# Patient Record
Sex: Male | Born: 1975 | Race: White | Hispanic: No | Marital: Married | Smoking: Former smoker
Health system: Southern US, Community
[De-identification: ages and names within clinical notes are randomized; demographics above are authoritative.]

## PROBLEM LIST (undated history)

## (undated) DIAGNOSIS — E119 Type 2 diabetes mellitus without complications: Secondary | ICD-10-CM

## (undated) DIAGNOSIS — K5790 Diverticulosis of intestine, part unspecified, without perforation or abscess without bleeding: Secondary | ICD-10-CM

## (undated) DIAGNOSIS — C679 Malignant neoplasm of bladder, unspecified: Secondary | ICD-10-CM

## (undated) DIAGNOSIS — Z87442 Personal history of urinary calculi: Secondary | ICD-10-CM

## (undated) DIAGNOSIS — K219 Gastro-esophageal reflux disease without esophagitis: Secondary | ICD-10-CM

## (undated) DIAGNOSIS — J45909 Unspecified asthma, uncomplicated: Secondary | ICD-10-CM

## (undated) DIAGNOSIS — K529 Noninfective gastroenteritis and colitis, unspecified: Secondary | ICD-10-CM

## (undated) DIAGNOSIS — K589 Irritable bowel syndrome without diarrhea: Secondary | ICD-10-CM

## (undated) HISTORY — DX: Irritable bowel syndrome, unspecified: K58.9

## (undated) HISTORY — DX: Diverticulosis of intestine, part unspecified, without perforation or abscess without bleeding: K57.90

---

## 1999-05-22 ENCOUNTER — Emergency Department (HOSPITAL_COMMUNITY): Admission: EM | Admit: 1999-05-22 | Discharge: 1999-05-22 | Payer: Self-pay | Admitting: Emergency Medicine

## 1999-05-22 ENCOUNTER — Encounter: Payer: Self-pay | Admitting: Emergency Medicine

## 2005-05-29 ENCOUNTER — Emergency Department: Payer: Self-pay | Admitting: Emergency Medicine

## 2005-05-29 ENCOUNTER — Other Ambulatory Visit: Payer: Self-pay

## 2005-12-26 ENCOUNTER — Emergency Department: Payer: Self-pay | Admitting: Emergency Medicine

## 2006-04-30 ENCOUNTER — Emergency Department: Payer: Self-pay | Admitting: Emergency Medicine

## 2011-12-18 ENCOUNTER — Encounter (HOSPITAL_COMMUNITY): Payer: Self-pay | Admitting: *Deleted

## 2011-12-18 ENCOUNTER — Emergency Department: Payer: Self-pay | Admitting: Emergency Medicine

## 2011-12-18 ENCOUNTER — Emergency Department (HOSPITAL_COMMUNITY)
Admission: EM | Admit: 2011-12-18 | Discharge: 2011-12-18 | Disposition: A | Discharge status: Home / Self Care | Payer: Self-pay | Attending: Emergency Medicine | Admitting: Emergency Medicine

## 2011-12-18 DIAGNOSIS — I7300 Raynaud's syndrome without gangrene: Secondary | ICD-10-CM | POA: Insufficient documentation

## 2011-12-18 DIAGNOSIS — R209 Unspecified disturbances of skin sensation: Secondary | ICD-10-CM | POA: Insufficient documentation

## 2011-12-18 DIAGNOSIS — F17200 Nicotine dependence, unspecified, uncomplicated: Secondary | ICD-10-CM | POA: Insufficient documentation

## 2011-12-18 MED ORDER — OXYCODONE-ACETAMINOPHEN 5-325 MG PO TABS: 2.0000 | ORAL_TABLET | ORAL | Status: AC | PRN

## 2011-12-18 NOTE — ED Notes (Signed)
To ED for eval of right arm numbness and swelling for the past cple of weeks. No injury noted. No sob. Appears in nad.

## 2011-12-18 NOTE — Discharge Instructions (Signed)
Raynaud's Syndrome Raynaud's Syndrome is a disorder of the blood vessels in your hands and feet. It occurs when small arteries of the arms/hands or legs/feet become sensitive to cold or emotional upset. This causes the arteries to constrict, or narrow, and reduces blood flow to the area. The color in the fingers or toes changes from white to bluish to red and this is not usually painful. There may be numbness and tingling. Sores on the skin (ulcers) can form. Symptoms are usually relieved by warming. HOME CARE INSTRUCTIONS   Avoid exposure to cold. Keep your whole body warm and dry. Dress in layers. Wear mittens or gloves when handling ice or frozen food and when outdoors. Use holders for glasses or cans containing cold drinks. If possible, stay indoors during cold weather.   Limit your use of caffeine. Switch to decaffeinated coffee, tea, and soda pop. Avoid chocolate.   Avoid smoking or being around cigarette smoke. Smoke will make symptoms worse.   Wear loose fitting socks and comfortable, roomy shoes.   Avoid vibrating tools and machinery.   If possible, avoid stressful and emotional situations. Exercise, meditation and yoga may help you cope with stress. Biofeedback may be useful.   Ask your caregiver about medicine (calcium channel blockers) that may control Raynaud's phenomena.  SEEK MEDICAL CARE IF:   Your discomfort becomes worse, despite conservative treatment.   You develop sores on your fingers and toes that do not heal.  Document Released: 07/14/2000 Document Revised: 07/06/2011 Document Reviewed: 07/21/2008 [facility] Patient Information 2012 [place], [place].  STOP smoking.  Use ibuprofen 600 mg every 6 hours for pain. Use percocet for more severe pain.  Follow up with an internal medicine doctor for reevaluation. Return for worse symptoms.

## 2011-12-18 NOTE — ED Provider Notes (Signed)
History  This chart was scribed for Cheri Guppy, MD by Bennett Scrape. This patient was seen in room STRE5/STRE5 and the patient's care was started at 2:22PM.  CSN: [id number]  Arrival date & time 12/18/11  1304   First MD Initiated Contact with Patient 12/18/11 1422      Chief Complaint  Patient presents with  . Numbness    The history is provided by the patient. No language interpreter was used.    Charles Beasley is a 36 y.o. male with a h/o asthma who presents to the Emergency Department complaining of 2 years of gradual onset, gradually worsening, transient right arm numbness from the elbow down with associated pain described as a throbbing sensation. Pt states that he came in today because the symptoms have been at their worst for the past 2 weeks. He reports that swelling of the right hand with mild yellowish discoloration with precede the numbness and pain. Pt states that the pain and numbness is worse with lifting his right arm up. He has not tried any OTC medications to improve his symptoms. He denies any other symptoms such as fever, nausea and weakness. He has no h/o chronic medical conditions. He is a current everyday smoker but denies alcohol use.  No PCP.  Past medical history: asthma-stated by pt at bedside  History reviewed. No pertinent past surgical history.  History reviewed. No pertinent family history.  History  Substance Use Topics  . Smoking status: Current Everyday Smoker    Types: Cigarettes  . Smokeless tobacco: Not on file  . Alcohol Use: No      Review of Systems  Constitutional: Negative for fever and chills.  Respiratory: Negative for cough and shortness of breath.   Gastrointestinal: Negative for nausea and vomiting.  Neurological: Positive for numbness (right arm). Negative for weakness.    Allergies  Review of patient's allergies indicates no known allergies.  Home Medications   Current Outpatient Rx  Name Route Sig Dispense  Refill  . ALBUTEROL SULFATE HFA 108 (90 BASE) MCG/ACT IN AERS Inhalation Inhale 2 puffs into the lungs every 6 (six) hours as needed. For shortness of breath    . OMEPRAZOLE 20 MG PO CPDR Oral Take 20 mg by mouth daily.      Triage Vitals: BP 135/74  Pulse 95  Temp(Src) 97.7 F (36.5 C) (Oral)  Resp 18  SpO2 98%  Physical Exam  Nursing note and vitals reviewed. Constitutional: He is oriented to person, place, and time. He appears well-developed and well-nourished. No distress.  HENT:  Head: Normocephalic and atraumatic.  Eyes: EOM are normal.  Neck: Neck supple. No tracheal deviation present.  Cardiovascular: Normal rate.        Good radial pulses  Pulmonary/Chest: Effort normal. No respiratory distress.  Musculoskeletal: Normal range of motion.       Good capillary refill, good strength in the right arm  Neurological: He is alert and oriented to person, place, and time.  Skin: Skin is warm and dry.  Psychiatric: He has a normal mood and affect. His behavior is normal.    ED Course  Procedures (including critical care time)  DIAGNOSTIC STUDIES: Oxygen Saturation is 98% on room air, normal by my interpretation.    COORDINATION OF CARE: 2:39PM-Discussed discharge plan which includes pain medications with pt and pt agreed. Advised pt to quit smoking due to the fact that it could be the cause of the symptoms. Also advised pt to follow up with my  referral to a rheumatologist.   Labs Reviewed - No data to display No results found.   No diagnosis found.    MDM  Raynaud's dz vs ulnar nerve compression No evidence stroke.  No weakness or discoloration now No vascular deficit asx at this time      I personally performed the services described in this documentation, which was scribed in my presence. The recorded information has been reviewed and considered.    Cheri Guppy, MD 12/18/11 [phone]

## 2013-12-07 ENCOUNTER — Ambulatory Visit: Payer: Self-pay | Admitting: Internal Medicine

## 2014-02-11 DIAGNOSIS — F32A Depression, unspecified: Secondary | ICD-10-CM | POA: Insufficient documentation

## 2014-03-12 DIAGNOSIS — G5601 Carpal tunnel syndrome, right upper limb: Secondary | ICD-10-CM | POA: Insufficient documentation

## 2014-03-12 DIAGNOSIS — H9319 Tinnitus, unspecified ear: Secondary | ICD-10-CM | POA: Insufficient documentation

## 2015-10-15 ENCOUNTER — Encounter: Payer: Self-pay | Admitting: Urgent Care

## 2015-10-15 ENCOUNTER — Emergency Department
Admission: EM | Admit: 2015-10-15 | Discharge: 2015-10-16 | Disposition: A | Discharge status: Home / Self Care | Payer: Medicaid Other | Attending: Emergency Medicine | Admitting: Emergency Medicine

## 2015-10-15 DIAGNOSIS — R000 Tachycardia, unspecified: Secondary | ICD-10-CM | POA: Insufficient documentation

## 2015-10-15 DIAGNOSIS — E119 Type 2 diabetes mellitus without complications: Secondary | ICD-10-CM | POA: Diagnosis not present

## 2015-10-15 DIAGNOSIS — Z79899 Other long term (current) drug therapy: Secondary | ICD-10-CM | POA: Insufficient documentation

## 2015-10-15 DIAGNOSIS — B379 Candidiasis, unspecified: Secondary | ICD-10-CM | POA: Diagnosis not present

## 2015-10-15 DIAGNOSIS — F17210 Nicotine dependence, cigarettes, uncomplicated: Secondary | ICD-10-CM | POA: Diagnosis not present

## 2015-10-15 DIAGNOSIS — B370 Candidal stomatitis: Secondary | ICD-10-CM

## 2015-10-15 DIAGNOSIS — R739 Hyperglycemia, unspecified: Secondary | ICD-10-CM | POA: Diagnosis present

## 2015-10-15 HISTORY — DX: Unspecified asthma, uncomplicated: J45.909

## 2015-10-15 LAB — URINALYSIS COMPLETE WITH MICROSCOPIC (ARMC ONLY)
BILIRUBIN URINE: NEGATIVE
Bacteria, UA: NONE SEEN
Leukocytes, UA: NEGATIVE
NITRITE: NEGATIVE
Protein, ur: NEGATIVE mg/dL
SPECIFIC GRAVITY, URINE: 1.037 — AB (ref 1.005–1.030)
Squamous Epithelial / LPF: NONE SEEN
pH: 6 (ref 5.0–8.0)

## 2015-10-15 LAB — BASIC METABOLIC PANEL
ANION GAP: 12 (ref 5–15)
BUN: 9 mg/dL (ref 6–20)
CALCIUM: 9.1 mg/dL (ref 8.9–10.3)
CO2: 22 mmol/L (ref 22–32)
Chloride: 95 mmol/L — ABNORMAL LOW (ref 101–111)
Creatinine, Ser: 1.05 mg/dL (ref 0.61–1.24)
GFR calc Af Amer: >60 mL/min (ref >60)
GLUCOSE: 512 mg/dL — AB (ref 65–99)
POTASSIUM: 3.6 mmol/L (ref 3.5–5.1)
Sodium: 129 mmol/L — ABNORMAL LOW (ref 135–145)

## 2015-10-15 LAB — CBC
HCT: 46.5 % (ref 40.0–52.0)
Hemoglobin: 16.3 g/dL (ref 13.0–18.0)
MCH: 29.7 pg (ref 26.0–34.0)
MCHC: 35 g/dL (ref 32.0–36.0)
MCV: 85 fL (ref 80.0–100.0)
Platelets: 367 10*3/uL (ref 150–440)
RBC: 5.47 MIL/uL (ref 4.40–5.90)
RDW: 12.9 % (ref 11.5–14.5)
WBC: 10.6 10*3/uL (ref 3.8–10.6)

## 2015-10-15 LAB — GLUCOSE, CAPILLARY
GLUCOSE-CAPILLARY: 485 mg/dL — AB (ref 65–99)
Glucose-Capillary: 330 mg/dL — ABNORMAL HIGH (ref 65–99)
Glucose-Capillary: 543 mg/dL — ABNORMAL HIGH (ref 65–99)

## 2015-10-15 MED ORDER — POTASSIUM CHLORIDE CRYS ER 20 MEQ PO TBCR
40.0000 meq | EXTENDED_RELEASE_TABLET | Freq: Once | ORAL | Status: AC
  Administered 2015-10-15: 40 meq via ORAL
  Filled 2015-10-15: qty 2

## 2015-10-15 MED ORDER — METFORMIN HCL 500 MG PO TABS: 500.0000 mg | ORAL_TABLET | Freq: Two times a day (BID) | ORAL | Status: DC

## 2015-10-15 MED ORDER — METFORMIN HCL 500 MG PO TABS
500.0000 mg | ORAL_TABLET | Freq: Two times a day (BID) | ORAL | Status: DC
Start: 2015-10-15 — End: 2019-09-06

## 2015-10-15 MED ORDER — INSULIN ASPART 100 UNIT/ML ~~LOC~~ SOLN
SUBCUTANEOUS | Status: DC
Start: 2015-10-15 — End: 2015-10-16
  Filled 2015-10-15: qty 5

## 2015-10-15 MED ORDER — SODIUM CHLORIDE 0.9 % IV BOLUS (SEPSIS)
1000.0000 mL | Freq: Once | INTRAVENOUS | Status: AC
  Administered 2015-10-15: 1000 mL via INTRAVENOUS

## 2015-10-15 MED ORDER — INSULIN ASPART 100 UNIT/ML ~~LOC~~ SOLN
5.0000 [IU] | Freq: Once | SUBCUTANEOUS | Status: AC
  Administered 2015-10-15: 5 [IU] via INTRAVENOUS
  Filled 2015-10-15: qty 5

## 2015-10-15 MED ORDER — NYSTATIN 100000 UNIT/ML MT SUSP: 5.0000 mL | Freq: Four times a day (QID) | OROMUCOSAL | Status: DC

## 2015-10-15 NOTE — ED Provider Notes (Signed)
[facility] Emergency Department Provider Note  ____________________________________________  Time seen: Approximately [id number] PM  I have reviewed the triage vital signs and the nursing notes.   HISTORY  Chief Complaint Hyperglycemia    HPI Charles Beasley is a 40 y.o. male with a history of asthma presenting to the emergency department today with 3 months of increased thirst as well as urinary frequency. He has also noticed today that he had patches on his tongue and the back of his throat. His wife bought a home glucometer and took his sugar at home which read greater than 600.He denies any pain, nausea or vomiting at this time. Patient says he has a history of diabetes in his grandmother.   Past Medical History  Diagnosis Date  . Asthma     There are no active problems to display for this patient.   History reviewed. No pertinent past surgical history.  Current Outpatient Rx  Name  Route  Sig  Dispense  Refill  . albuterol (PROVENTIL HFA;VENTOLIN HFA) 108 (90 BASE) MCG/ACT inhaler   Inhalation   Inhale 2 puffs into the lungs every 6 (six) hours as needed. For shortness of breath         . omeprazole (PRILOSEC) 20 MG capsule   Oral   Take 20 mg by mouth daily.           Allergies Review of patient's allergies indicates no known allergies.  No family history on file.  Social History Social History  Substance Use Topics  . Smoking status: Current Every Day Smoker    Types: Cigarettes  . Smokeless tobacco: None  . Alcohol Use: No    Review of Systems Constitutional: No fever/chills Eyes: No visual changes. ENT: No sore throat. Cardiovascular: Denies chest pain. Respiratory: Denies shortness of breath. Gastrointestinal: No abdominal pain.  No nausea, no vomiting.  No diarrhea.  No constipation. Genitourinary: Urinary frequency Musculoskeletal: Negative for back pain. Skin: Negative for rash. Neurological: Negative for headaches,  focal weakness or numbness.  10-point ROS otherwise negative.  ____________________________________________   PHYSICAL EXAM:  VITAL SIGNS: ED Triage Vitals  Enc Vitals Group     BP 10/15/15 2152 149/109 mmHg     Pulse Rate 10/15/15 2152 118     Resp 10/15/15 2152 20     Temp 10/15/15 2152 98 F (36.7 C)     Temp Source 10/15/15 2152 Oral     SpO2 10/15/15 2152 97 %     Weight --      Height --      Head Cir --      Peak Flow --      Pain Score 10/15/15 2151 0     Pain Loc --      Pain Edu? --      Excl. in [place]? --     Constitutional: Alert and oriented. Well appearing and in no acute distress. Eyes: Conjunctivae are normal. PERRL. EOMI. Head: Atraumatic. Nose: No congestion/rhinnorhea. Mouth/Throat:  Mildly dry mucous membranes with small patches of what appear to be thrush on the soft palate and tongue. I'm able to scrape a small amount of these patches with a tongue depressor. Oropharynx non-erythematous. Neck: No stridor.   Cardiovascular: Tachycardic, regular rhythm. Grossly normal heart sounds.  Good peripheral circulation. Respiratory: Normal respiratory effort.  No retractions. Lungs CTAB. Gastrointestinal: Soft and nontender. No distention. No abdominal bruits. No CVA tenderness. Musculoskeletal: No lower extremity tenderness nor edema.  No joint effusions. Neurologic:  Normal speech and language. No gross focal neurologic deficits are appreciated.  Skin:  Skin is warm, dry and intact. No rash noted. Psychiatric: Mood and affect are normal. Speech and behavior are normal.  ____________________________________________   LABS (all labs ordered are listed, but only abnormal results are displayed)  Labs Reviewed  GLUCOSE, CAPILLARY - Abnormal; Notable for the following:    Glucose-Capillary 543 (*)    All other components within normal limits  BASIC METABOLIC PANEL - Abnormal; Notable for the following:    Sodium 129 (*)    Chloride 95 (*)    Glucose, Bld 512  (*)    All other components within normal limits  GLUCOSE, CAPILLARY - Abnormal; Notable for the following:    Glucose-Capillary 485 (*)    All other components within normal limits  URINALYSIS COMPLETEWITH MICROSCOPIC ([facility] ONLY) - Abnormal; Notable for the following:    Color, Urine STRAW (*)    APPearance CLEAR (*)    Glucose, UA >500 (*)    Ketones, ur TRACE (*)    Specific Gravity, Urine 1.037 (*)    Hgb urine dipstick 1+ (*)    All other components within normal limits  CBC  CBG MONITORING, ED  CBG MONITORING, ED   ____________________________________________  EKG   ____________________________________________  RADIOLOGY   ____________________________________________   PROCEDURES  ____________________________________________   INITIAL IMPRESSION / ASSESSMENT AND PLAN / ED COURSE  Pertinent labs & imaging results that were available during my care of the patient were reviewed by me and considered in my medical decision making (see chart for details).  ----------------------------------------- 11:38 PM on 10/15/2015 -----------------------------------------  Patient pending repeat glucose. Signed out to Dr. Dahlia Client. Plan is that of patient's glucose comes down reasonably he should be able to be discharged on metformin. He does not have an elevated anion gap. He does not have a low bicarbonate. He does not appear to be in diabetic ketoacidosis. ____________________________________________   FINAL CLINICAL IMPRESSION(S) / ED DIAGNOSES  New-onset diabetes. Thrush.    Orbie Pyo, MD 10/15/15 [phone]

## 2015-10-15 NOTE — ED Notes (Signed)
Patient presents with c/o polydipsia, polyphagia, and (+) visual changes (blurred) since December. Wife checked CBG today and found it to be >600; no PMH significance for DM.

## 2015-10-16 LAB — GLUCOSE, CAPILLARY: GLUCOSE-CAPILLARY: 226 mg/dL — AB (ref 65–99)

## 2015-10-16 MED ORDER — SODIUM CHLORIDE 0.9 % IV BOLUS (SEPSIS)
1000.0000 mL | Freq: Once | INTRAVENOUS | Status: AC
  Administered 2015-10-16: 1000 mL via INTRAVENOUS

## 2015-10-16 NOTE — ED Provider Notes (Signed)
-----------------------------------------   2:04 AM on 10/16/2015 -----------------------------------------   Blood pressure 131/93, pulse 91, temperature 98 F (36.7 C), temperature source Oral, resp. rate 18, SpO2 95 %.  Assuming care from Dr. Clearnce Hasten.  In short, Charles Beasley is a 40 y.o. male with a chief complaint of Hyperglycemia .  Refer to the original H&P for additional details.  The current plan of care is to follow up the repeat blood sugar after the second liter of fluid.  Repeat blood sugar is 226, patient will be discharged home   Loney Hering, MD 10/16/15 [phone]

## 2015-11-08 ENCOUNTER — Encounter: Payer: Self-pay | Admitting: Dietician

## 2015-11-08 ENCOUNTER — Encounter: Payer: Medicaid Other | Attending: Family Medicine | Admitting: Dietician

## 2015-11-08 VITALS — BP 137/87 | Ht 69.0 in | Wt 167.1 lb

## 2015-11-08 DIAGNOSIS — E119 Type 2 diabetes mellitus without complications: Secondary | ICD-10-CM | POA: Diagnosis present

## 2015-11-08 NOTE — Patient Instructions (Signed)
Check blood sugars 2 x day before breakfast and 2 hrs after supper every day  Exercise:  Continue regular exercise (basketball and home gym 2-3 hr 5x/wk.)   Avoid sugar sweetened drinks (soda, tea, coffee, sports drinks, juices)  Limit intake of sweets and fried foods  Make healthy food choices  Eat 3 meals day,  1-2  snacks a day  Space meals 4-6 hours apart  Make dentist appointment (go every 6 months)  Bring blood sugar records to the next appointment/class  Get a Sharps container  Return for appointment/classes on: pt to call and schedule

## 2015-11-08 NOTE — Progress Notes (Signed)
Diabetes Self-Management Education  Visit Type: First/Initial  Appt. Start Time: 1330 Appt. End Time: 1430  11/08/2015  [name], identified by name and date of birth, is a 40 y.o. male with a diagnosis of Diabetes: Type 2.   ASSESSMENT  Blood pressure 137/87, height 5\' 9"  (1.753 m), weight 167 lb 1.6 oz (75.796 kg). Body mass index is 24.67 kg/(m^2).  Lacks knowledge of diabetes care       Diabetes Self-Management Education - 11/08/15 1501    Visit Information   Visit Type First/Initial   Initial Visit   Diabetes Type Type 2   Health Coping   How would you rate your overall health? Good   Psychosocial Assessment   Patient Belief/Attitude about Diabetes Motivated to manage diabetes   Self-care barriers None   Patient Concerns Medication;Glycemic Control  become more fit   Special Needs None   Preferred Learning Style --  auditory, visual, hands-on   Learning Readiness Ready   What is the last grade level you completed in school? [id number]   Complications   Last HgB A1C per patient/outside source 9.9 %  10-18-15   How often do you check your blood sugar? --  2-3x/day (pt uses [employer] meter)   Fasting Blood glucose range (mg/dL) --  recent 117-169 + 206x1   Postprandial Blood glucose range (mg/dL) 70-129;130-179;180-200   Have you had a dilated eye exam in the past 12 months? Yes   Have you had a dental exam in the past 12 months? No   Are you checking your feet? Yes   How many days per week are you checking your feet? 7   Dietary Intake   Breakfast --  usually eats 10a-12p-occasionally skips   Lunch --  eats lunch at 1-2p=eats fried foods 7 sweets 2-3x/wk.   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)   Media planner (evening) --  eats bedtime snack 10-11p=snack bars or poptart   Beverage(s) --  drinks 8+ waters/day and 2-3 sugar free flavored drinks/day   Exercise   Exercise Type ADL's  plays basketball and works out on home gym equipment 2-3 hr 5x/wk   Patient Education   Previous Diabetes Education No   Disease state  --  definition of type 2 diabetes and treatment options   Nutrition management  Role of diet in the treatment of diabetes and the relationship between the three main macronutrients and blood glucose level;Food label reading, portion sizes and measuring food.;Carbohydrate counting   Physical activity and exercise  Role of exercise on diabetes management, blood pressure control and cardiac health.   Medications --  reviewed use of Metformin (pt started taking 500mg  one tablet BID and increased to 1 tablet in AM and 2 tabs in PM which has caused some GI upset-now MD advised to increase to 1000mg  BID and pt concerned GI symptoms may worsen-pt has IBS)   Monitoring Purpose and frequency of SMBG.;Identified appropriate SMBG and/or A1C goals.;Yearly dilated eye exam;Taught/discussed recording of test results and interpretation of SMBG.   Chronic complications Relationship between chronic complications and blood glucose control;Dental care;Retinopathy and reason for yearly dilated eye exams   Personal strategies to promote health Lifestyle issues that need to be addressed for better diabetes care      Individualized Plan for Diabetes Self-Management Training:   Learning Objective:  Patient will have a greater understanding of diabetes self-management.  Patient education plan is to attend individual and/or group sessions per assessed needs and concerns.   Plan:   Patient Instructions  Check blood sugars 2 x day before breakfast and 2 hrs after supper every day  Exercise:  Continue regular exercise (basketball and home gym 2-3 hr 5x/wk.)   Avoid sugar sweetened drinks (soda, tea, coffee, sports drinks, juices)  Limit intake of sweets and fried foods  Make healthy food choices  Eat 3 meals day,  1-2  snacks a day  Space meals  4-6 hours apart  Make dentist appointment (go every 6 months)  Bring blood sugar records to the next appointment/class  Get a Sharps container  Return for appointment/classes on: pt to call and schedule   Expected Outcomes:   positive  Education material provided: General Meal Planning Guidelines  If problems or questions, patient to contact team via:  [phone]  Future DSME appointment:   pt to call to schedule diabetes classes

## 2015-11-29 ENCOUNTER — Encounter: Payer: Self-pay | Admitting: Dietician

## 2015-11-29 NOTE — Progress Notes (Signed)
Have not heard from pt about scheduling diabetes classes. Called pt but number is disconnected. Mailed card asking pt to call and schedule classes.

## 2015-12-14 ENCOUNTER — Encounter: Payer: Self-pay | Admitting: Dietician

## 2015-12-14 NOTE — Progress Notes (Signed)
Have not heard from pt-pt discharged and letter sent to MD

## 2016-06-21 DIAGNOSIS — E119 Type 2 diabetes mellitus without complications: Secondary | ICD-10-CM | POA: Insufficient documentation

## 2016-11-28 DIAGNOSIS — N200 Calculus of kidney: Secondary | ICD-10-CM | POA: Insufficient documentation

## 2017-01-30 ENCOUNTER — Emergency Department: Payer: Medicaid Other

## 2017-01-30 ENCOUNTER — Emergency Department
Admission: EM | Admit: 2017-01-30 | Discharge: 2017-01-30 | Disposition: A | Discharge status: Home / Self Care | Payer: Medicaid Other | Attending: Emergency Medicine | Admitting: Emergency Medicine

## 2017-01-30 ENCOUNTER — Encounter: Payer: Self-pay | Admitting: Emergency Medicine

## 2017-01-30 DIAGNOSIS — Z87891 Personal history of nicotine dependence: Secondary | ICD-10-CM | POA: Diagnosis not present

## 2017-01-30 DIAGNOSIS — Z79899 Other long term (current) drug therapy: Secondary | ICD-10-CM | POA: Diagnosis not present

## 2017-01-30 DIAGNOSIS — J45909 Unspecified asthma, uncomplicated: Secondary | ICD-10-CM | POA: Diagnosis not present

## 2017-01-30 DIAGNOSIS — N200 Calculus of kidney: Secondary | ICD-10-CM | POA: Diagnosis not present

## 2017-01-30 DIAGNOSIS — R109 Unspecified abdominal pain: Secondary | ICD-10-CM | POA: Diagnosis present

## 2017-01-30 LAB — URINALYSIS, COMPLETE (UACMP) WITH MICROSCOPIC
Bacteria, UA: NONE SEEN
Bilirubin Urine: NEGATIVE
Glucose, UA: >=500 mg/dL — AB
HGB URINE DIPSTICK: NEGATIVE
KETONES UR: NEGATIVE mg/dL
Leukocytes, UA: NEGATIVE
NITRITE: NEGATIVE
PROTEIN: NEGATIVE mg/dL
Specific Gravity, Urine: 1.018 (ref 1.005–1.030)
Squamous Epithelial / LPF: NONE SEEN
pH: 6 (ref 5.0–8.0)

## 2017-01-30 LAB — COMPREHENSIVE METABOLIC PANEL
ALK PHOS: 69 U/L (ref 38–126)
ALT: 14 U/L — ABNORMAL LOW (ref 17–63)
ANION GAP: 4 — AB (ref 5–15)
AST: 25 U/L (ref 15–41)
Albumin: 4.1 g/dL (ref 3.5–5.0)
BUN: 18 mg/dL (ref 6–20)
CALCIUM: 9.3 mg/dL (ref 8.9–10.3)
CO2: 27 mmol/L (ref 22–32)
Chloride: 107 mmol/L (ref 101–111)
Creatinine, Ser: 1.22 mg/dL (ref 0.61–1.24)
GFR calc non Af Amer: >60 mL/min (ref >60)
Glucose, Bld: 239 mg/dL — ABNORMAL HIGH (ref 65–99)
POTASSIUM: 4.2 mmol/L (ref 3.5–5.1)
Sodium: 138 mmol/L (ref 135–145)
Total Bilirubin: 0.6 mg/dL (ref 0.3–1.2)
Total Protein: 7.5 g/dL (ref 6.5–8.1)

## 2017-01-30 LAB — CBC
HEMATOCRIT: 41.3 % (ref 40.0–52.0)
HEMOGLOBIN: 14.2 g/dL (ref 13.0–18.0)
MCH: 30 pg (ref 26.0–34.0)
MCHC: 34.3 g/dL (ref 32.0–36.0)
MCV: 87.4 fL (ref 80.0–100.0)
Platelets: 357 10*3/uL (ref 150–440)
RBC: 4.72 MIL/uL (ref 4.40–5.90)
RDW: 13.6 % (ref 11.5–14.5)
WBC: 13.4 10*3/uL — ABNORMAL HIGH (ref 3.8–10.6)

## 2017-01-30 LAB — LIPASE, BLOOD: LIPASE: 30 U/L (ref 11–51)

## 2017-01-30 MED ORDER — TAMSULOSIN HCL 0.4 MG PO CAPS
0.4000 mg | ORAL_CAPSULE | Freq: Once | ORAL | Status: AC
  Administered 2017-01-30: 0.4 mg via ORAL
  Filled 2017-01-30: qty 1

## 2017-01-30 MED ORDER — ONDANSETRON HCL 4 MG/2ML IJ SOLN
4.0000 mg | Freq: Once | INTRAMUSCULAR | Status: AC
  Administered 2017-01-30: 4 mg via INTRAVENOUS
  Filled 2017-01-30: qty 2

## 2017-01-30 MED ORDER — MORPHINE SULFATE (PF) 4 MG/ML IV SOLN
INTRAVENOUS | Status: AC
  Filled 2017-01-30: qty 1

## 2017-01-30 MED ORDER — MORPHINE SULFATE (PF) 4 MG/ML IV SOLN
4.0000 mg | Freq: Once | INTRAVENOUS | Status: AC
  Administered 2017-01-30: 4 mg via INTRAVENOUS
  Filled 2017-01-30: qty 1

## 2017-01-30 MED ORDER — MORPHINE SULFATE (PF) 4 MG/ML IV SOLN
4.0000 mg | Freq: Once | INTRAVENOUS | Status: AC
  Administered 2017-01-30: 4 mg via INTRAVENOUS

## 2017-01-30 MED ORDER — OXYCODONE-ACETAMINOPHEN 5-325 MG PO TABS: 1.0000 | ORAL_TABLET | ORAL | 0 refills | Status: DC | PRN

## 2017-01-30 MED ORDER — KETOROLAC TROMETHAMINE 10 MG PO TABS: 10.0000 mg | ORAL_TABLET | Freq: Three times a day (TID) | ORAL | 0 refills | Status: DC | PRN

## 2017-01-30 MED ORDER — KETOROLAC TROMETHAMINE 30 MG/ML IJ SOLN
30.0000 mg | Freq: Once | INTRAMUSCULAR | Status: AC
  Administered 2017-01-30: 30 mg via INTRAVENOUS
  Filled 2017-01-30: qty 1

## 2017-01-30 MED ORDER — TAMSULOSIN HCL 0.4 MG PO CAPS: 0.4000 mg | ORAL_CAPSULE | Freq: Every day | ORAL | 0 refills | Status: DC

## 2017-01-30 MED ORDER — ONDANSETRON HCL 4 MG PO TABS: 4.0000 mg | ORAL_TABLET | Freq: Three times a day (TID) | ORAL | 0 refills | Status: DC | PRN

## 2017-01-30 MED ORDER — SODIUM CHLORIDE 0.9 % IV BOLUS (SEPSIS)
1000.0000 mL | Freq: Once | INTRAVENOUS | Status: AC
  Administered 2017-01-30: 1000 mL via INTRAVENOUS

## 2017-01-30 NOTE — ED Provider Notes (Signed)
[facility] Emergency Department Provider Note ____________________________________________   I have reviewed the triage vital signs and the triage nursing note.  HISTORY  Chief Complaint Abdominal Pain   Historian Patient  HPI Charles Beasley is a 41 y.o. male with a hisotry of ibs, prior kidney stone and diverticulitis, presenting for lower abdominal pain and right flank painthat started around 1am.  Flank pain constant, radiation into the right and across bladder area is intermittent.  Currently rates 8/10 pain. He did have some pain down into the right testicle, currently testicle not tender or swollen.  Denies constipation or diarrhea. Denies black or bloody stools. Denies nausea or vomiting. States he might have a low-grade fever.  Nothing makes it worse or better.    Past Medical History:  Diagnosis Date  . Asthma   . Diverticulosis   . IBS (irritable bowel syndrome)     There are no active problems to display for this patient.   History reviewed. No pertinent surgical history.  Prior to Admission medications   Medication Sig Start Date End Date Taking? Authorizing Provider  fexofenadine (ALLEGRA) 180 MG tablet Take 180 mg by mouth daily.   Yes [provider]  meloxicam (MOBIC) 7.5 MG tablet Take 7.5 mg by mouth daily.    Yes [provider]  metFORMIN (GLUCOPHAGE) 500 MG tablet Take 1 tablet (500 mg total) by mouth 2 (two) times daily with a meal. 10/15/15 01/30/17 Yes Schaevitz, Randall An, MD  omeprazole (PRILOSEC) 40 MG capsule Take 1 capsule by mouth daily. 03/23/15 01/30/17 Yes [provider]  albuterol (PROVENTIL HFA;VENTOLIN HFA) 108 (90 BASE) MCG/ACT inhaler Inhale 2 puffs into the lungs every 6 (six) hours as needed. Reported on 11/08/2015    [provider]  ketorolac (TORADOL) 10 MG tablet Take 1 tablet (10 mg total) by mouth every 8 (eight) hours as needed for moderate pain. 01/30/17   Lisa Roca, MD   ondansetron (ZOFRAN) 4 MG tablet Take 1 tablet (4 mg total) by mouth every 8 (eight) hours as needed for nausea or vomiting. 01/30/17   Lisa Roca, MD  oxyCODONE-acetaminophen (ROXICET) 5-325 MG tablet Take 1 tablet by mouth every 4 (four) hours as needed for severe pain. 01/30/17   Lisa Roca, MD  tamsulosin (FLOMAX) 0.4 MG CAPS capsule Take 1 capsule (0.4 mg total) by mouth daily. 01/30/17   Lisa Roca, MD    Allergies  Allergen Reactions  . Montelukast Photosensitivity  . Sertraline Other (See Comments)    Feels out of it, zombie    History reviewed. No pertinent family history.  Social History Social History  Substance Use Topics  . Smoking status: Former Smoker    Types: Cigarettes  . Smokeless tobacco: Not on file  . Alcohol use No    Review of Systems  Constitutional: Negative for fever. Eyes: Negative for visual changes. ENT: Negative for sore throat. Cardiovascular: Negative for chest pain. Respiratory: Negative for shortness of breath. Gastrointestinal: Negative for  vomiting and diarrhea. Genitourinary: Negative for dysuria. Musculoskeletal: Negative for extremity pain. Skin: Negative for rash. Neurological: Negative for headache.  ____________________________________________   PHYSICAL EXAM:  VITAL SIGNS: ED Triage Vitals  Enc Vitals Group     BP 01/30/17 0727 (!) 181/107     Pulse Rate 01/30/17 0727 93     Resp 01/30/17 0727 18     Temp 01/30/17 0727 98.4 F (36.9 C)     Temp Source 01/30/17 0727 Oral  SpO2 01/30/17 0727 98 %     Weight 01/30/17 0727 156 lb (70.8 kg)     Height 01/30/17 0727 5\' 8"  (1.727 m)     Head Circumference --      Peak Flow --      Pain Score 01/30/17 0726 8     Pain Loc --      Pain Edu? --      Excl. in [place]? --      Constitutional: Alert and oriented. Well appearing and in no distress. HEENT   Head: Normocephalic and atraumatic.      Eyes: Conjunctivae are normal. Pupils equal and round.       Ears:          Nose: No congestion/rhinnorhea.   Mouth/Throat: Mucous membranes are moist.   Neck: No stridor. Cardiovascular/Chest: Normal rate, regular rhythm.  No murmurs, rubs, or gallops. Respiratory: Normal respiratory effort without tachypnea nor retractions. Breath sounds are clear and equal bilaterally. No wheezes/rales/rhonchi. Gastrointestinal: Soft. No distention, no guarding, no rebound. Minimal tenderness of the lower abdomen without focal McBurney's point tenderness.  Genitourinary/rectal:Deferred Musculoskeletal: Nontender with normal range of motion in all extremities. No joint effusions.  No lower extremity tenderness.  No edema. Neurologic:  Normal speech and language. No gross or focal neurologic deficits are appreciated. Skin:  Skin is warm, dry and intact. No rash noted. Psychiatric: Mood and affect are normal. Speech and behavior are normal. Patient exhibits appropriate insight and judgment.   ____________________________________________  LABS (pertinent positives/negatives)  Labs Reviewed  COMPREHENSIVE METABOLIC PANEL - Abnormal; Notable for the following:       Result Value   Glucose, Bld 239 (*)    ALT 14 (*)    Anion gap 4 (*)    All other components within normal limits  CBC - Abnormal; Notable for the following:    WBC 13.4 (*)    All other components within normal limits  URINALYSIS, COMPLETE (UACMP) WITH MICROSCOPIC - Abnormal; Notable for the following:    Color, Urine YELLOW (*)    APPearance CLEAR (*)    Glucose, UA >=500 (*)    All other components within normal limits  LIPASE, BLOOD    ____________________________________________    EKG I, Lisa Roca, MD, the attending physician have personally viewed and interpreted all ECGs.  None ____________________________________________  RADIOLOGY All Xrays were viewed by me. Imaging interpreted by Radiologist.  Ultrasound renal:  IMPRESSION: Mild right-sided hydronephrosis. A an echogenic  focus is noted in the upper pole calyx without significant shadowing. This may represent a small nonobstructing stone. There is likely a distal ureteral stone present. CT urogram may be helpful for further evaluation. __________________________________________  PROCEDURES  Procedure(s) performed: None  Critical Care performed: None  ____________________________________________   ED COURSE / ASSESSMENT AND PLAN  Pertinent labs & imaging results that were available during my care of the patient were reviewed by me and considered in my medical decision making (see chart for details).    Mr. Hayashi is here for right-sided flank pain wrapping around which seems clinically most consistent with kidney stone.  We discussed obtaining renal ultrasound rather than CT.  On exam he does not have any focal McBurney's point tenderness. On urinalysis, no significant hematuria, and white blood cell count minimally elevated.  Ultrasound does show right-sided hydro-which I think given his symptoms of right flank discomfort, and no McBurney's point tenderness seemed consistent for moving 4 with diagnosis and treatment of kidney stone.  Patient  received 2 doses of morphine and then dose of ketorolac for symptomatic relief.  I discussed with patient and wife regarding my recommendation to hold off on CT scan at this point time, as it would not change management for kidney stone, I don't have a suspicion at this point for other intra-abdominal emergency condition.    CONSULTATIONS:  None   Patient / Family / Caregiver informed of clinical course, medical decision-making process, and agree with plan.   I discussed return precautions, follow-up instructions, and discharge instructions with patient and/or family.  Discharge Instructions : You were evaluated for right-sided flank pain and were found have evidence of enlarged ureter indicating likely passing a kidney stone on the right side.  Return  to the emergency department immediately for any worsening condition including uncontrolled pain, no worsening abdominal pain, fever, inability to urinate, vomiting and cannot keep medications down, or any other symptoms concerning to you.  If you have not passed stone within 1 week, to make him an appointment with an urologist.  Strain urine and can bring to primary doctor for analysis if you collect it.  ___________________________________________   FINAL CLINICAL IMPRESSION(S) / ED DIAGNOSES   Final diagnoses:  Kidney stone              Note: This dictation was prepared with Dragon dictation. Any transcriptional errors that result from this process are unintentional    Lisa Roca, MD 01/30/17 1017

## 2017-01-30 NOTE — ED Triage Notes (Signed)
Pt c/o lower abdominal and right flank pain. Hx stone and diverticulitis but doesn't feel like either. Has had vomiting. All started at 200 am. Took tramadol from wife. NAD. Ambulatory to room.

## 2017-01-30 NOTE — ED Notes (Signed)
AAOx3.  Skin warm and dry.  NAD 

## 2017-01-30 NOTE — Discharge Instructions (Signed)
You were evaluated for right-sided flank pain and were found have evidence of enlarged ureter indicating likely passing a kidney stone on the right side.  Return to the emergency department immediately for any worsening condition including uncontrolled pain, no worsening abdominal pain, fever, inability to urinate, vomiting and cannot keep medications down, or any other symptoms concerning to you.  If you have not passed stone within 1 week, to make him an appointment with an urologist.  Strain urine and can bring to primary doctor for analysis if you collect it.

## 2017-01-30 NOTE — ED Notes (Signed)
To [place] via stretcher.  AAOx3.  Skin warm and dry.

## 2017-02-02 ENCOUNTER — Ambulatory Visit (INDEPENDENT_AMBULATORY_CARE_PROVIDER_SITE_OTHER): Payer: Medicaid Other | Admitting: Urology

## 2017-02-02 ENCOUNTER — Encounter: Payer: Self-pay | Admitting: Urology

## 2017-02-02 VITALS — BP 133/81 | HR 89 | Ht 68.0 in | Wt 155.9 lb

## 2017-02-02 DIAGNOSIS — Z87442 Personal history of urinary calculi: Secondary | ICD-10-CM | POA: Diagnosis not present

## 2017-02-02 DIAGNOSIS — N1330 Unspecified hydronephrosis: Secondary | ICD-10-CM

## 2017-02-02 LAB — URINALYSIS, COMPLETE
Bilirubin, UA: NEGATIVE
Glucose, UA: NEGATIVE
KETONES UA: NEGATIVE
Nitrite, UA: NEGATIVE
PH UA: 7 (ref 5.0–7.5)
RBC, UA: NEGATIVE
SPEC GRAV UA: 1.02 (ref 1.005–1.030)
Urobilinogen, Ur: 0.2 mg/dL (ref 0.2–1.0)

## 2017-02-02 LAB — MICROSCOPIC EXAMINATION: RBC, UA: NONE SEEN /hpf (ref 0–?)

## 2017-02-02 NOTE — Progress Notes (Signed)
02/02/2017 1:45 PM   Charles Beasley 1976/05/21 [id number]  Referring provider: [facility] [address] [place], [place] [address]  Chief Complaint  Patient presents with  . Nephrolithiasis    HPI: The patient is a 41 year old gentleman with a past medical history of nephrolithiasis who presents today after being seen in the ED for right flank pain and a renal ultrasound showed mild right-sided hydronephrosis. There is a echogenic focus in the right upper pole as well possibly representing a small nonobstructing stone. He did not undergo a CT scan during this visit to the ED per the recommendation of the emergency room physician. He presents today with right flank pain noted still fairly well controlled with Toradol and occasionally Percocet at night. He is straining his urine and on Flomax. He has not passed any stone. He did nephrolithiasis approximate 16 years ago and passed his stone spontaneously. No other stone episodes. He denies nausea, vomiting, fever, and chills.  Urinalysis at this time showed no evidence of hematuria.   PMH: Past Medical History:  Diagnosis Date  . Asthma   . Diverticulosis   . IBS (irritable bowel syndrome)     Surgical History: History reviewed. No pertinent surgical history.  Home Medications:  Allergies as of 02/02/2017      Reactions   Montelukast Photosensitivity   Sertraline Other (See Comments)   Feels out of it, zombie      Medication List       Accurate as of 02/02/17  1:45 PM. Always use your most recent med list.          albuterol 108 (90 Base) MCG/ACT inhaler Commonly known as:  PROVENTIL HFA;VENTOLIN HFA Inhale 2 puffs into the lungs every 6 (six) hours as needed. Reported on 11/08/2015   fexofenadine 180 MG tablet Commonly known as:  ALLEGRA Take 180 mg by mouth daily.   ketorolac 10 MG tablet Commonly known as:  TORADOL Take 1 tablet (10 mg total) by mouth every 8 (eight) hours as needed for moderate pain.   meloxicam 7.5 MG tablet Commonly known as:  MOBIC Take 7.5 mg by mouth daily.   metFORMIN 500 MG tablet Commonly known as:  GLUCOPHAGE Take 1 tablet (500 mg total) by mouth 2 (two) times daily with a meal.   omeprazole 40 MG capsule Commonly known as:  PRILOSEC Take 1 capsule by mouth daily.   ondansetron 4 MG tablet Commonly known as:  ZOFRAN Take 1 tablet (4 mg total) by mouth every 8 (eight) hours as needed for nausea or vomiting.   oxyCODONE-acetaminophen 5-325 MG tablet Commonly known as:  ROXICET Take 1 tablet by mouth every 4 (four) hours as needed for severe pain.   tamsulosin 0.4 MG Caps capsule Commonly known as:  FLOMAX Take 1 capsule (0.4 mg total) by mouth daily.       Allergies:  Allergies  Allergen Reactions  . Montelukast Photosensitivity  . Sertraline Other (See Comments)    Feels out of it, zombie    Family History: Family History  Problem Relation Age of Onset  . Prostate cancer Neg Hx   . Bladder Cancer Neg Hx   . Kidney cancer Neg Hx     Social History:  reports that he has quit smoking. His smoking use included Cigarettes. He has never used smokeless tobacco. He reports that he does not drink alcohol or use drugs.  ROS: UROLOGY Frequent Urination?: No Hard to postpone urination?: No Burning/pain with urination?:  No Get up at night to urinate?: No Leakage of urine?: No Urine stream starts and stops?: No Trouble starting stream?: No Do you have to strain to urinate?: No Blood in urine?: No Urinary tract infection?: No Sexually transmitted disease?: No Injury to kidneys or bladder?: No Painful intercourse?: No Weak stream?: No Erection problems?: No Penile pain?: No  Gastrointestinal Nausea?: Yes Vomiting?: Yes Indigestion/heartburn?: Yes Diarrhea?: Yes Constipation?: No  Constitutional Fever: Yes Night sweats?: Yes Weight loss?: Yes Fatigue?: Yes  Skin Skin rash/lesions?: No Itching?: No  Eyes Blurred vision?:  Yes Double vision?: No  Ears/Nose/Throat Sore throat?: Yes Sinus problems?: Yes  Hematologic/Lymphatic Swollen glands?: No Easy bruising?: No  Cardiovascular Leg swelling?: No Chest pain?: No  Respiratory Cough?: Yes Shortness of breath?: Yes  Endocrine Excessive thirst?: Yes  Musculoskeletal Back pain?: No Joint pain?: Yes  Neurological Headaches?: Yes Dizziness?: No  Psychologic Depression?: Yes Anxiety?: Yes  Physical Exam: BP 133/81 (BP Location: Left Arm, Patient Position: Sitting, Cuff Size: Normal)   Pulse 89   Ht 5\' 8"  (1.727 m)   Wt 155 lb 14.4 oz (70.7 kg)   BMI 23.70 kg/m   Constitutional:  Alert and oriented, No acute distress. HEENT: [place] AT, moist mucus membranes.  Trachea midline, no masses. Cardiovascular: No clubbing, cyanosis, or edema. Respiratory: Normal respiratory effort, no increased work of breathing. GI: Abdomen is soft, nontender, nondistended, no abdominal masses GU: No CVA tenderness.  Skin: No rashes, bruises or suspicious lesions. Lymph: No cervical or inguinal adenopathy. Neurologic: Grossly intact, no focal deficits, moving all 4 extremities. Psychiatric: Normal mood and affect.  Laboratory Data: Lab Results  Component Value Date   WBC 13.4 (H) 01/30/2017   HGB 14.2 01/30/2017   HCT 41.3 01/30/2017   MCV 87.4 01/30/2017   PLT 357 01/30/2017    Lab Results  Component Value Date   CREATININE 1.22 01/30/2017    No results found for: PSA  No results found for: TESTOSTERONE  No results found for: HGBA1C  Urinalysis    Component Value Date/Time   COLORURINE YELLOW (A) 01/30/2017 0801   APPEARANCEUR CLEAR (A) 01/30/2017 0801   LABSPEC 1.018 01/30/2017 0801   PHURINE 6.0 01/30/2017 0801   GLUCOSEU >=500 (A) 01/30/2017 0801   HGBUR NEGATIVE 01/30/2017 0801   BILIRUBINUR NEGATIVE 01/30/2017 0801   KETONESUR NEGATIVE 01/30/2017 0801   PROTEINUR NEGATIVE 01/30/2017 0801   NITRITE NEGATIVE 01/30/2017 0801    LEUKOCYTESUR NEGATIVE 01/30/2017 0801    Pertinent Imaging: Renal ultrasound reviewed as above.  Assessment & Plan:   1. History of nephrolithiasis 2. Right hydronephrosis I discussed with the patient that his renal ultrasound that showed right hydronephrosis which likely is from nephrolithiasis, however he cannot make a diagnosis of right ureteral stone based on this imaging. I also discussed that it is unclear if the artifact seen in his right kidney that was nonobstructing is a calculus. I discussed treatment approaches which time which include repeat renal ultrasound with KUB in 2 weeks to look for resolution of hydronephrosis versus undergoing CT stone study now to better characterize true stone burden. He has elected to undergo a CT stone protocol. We will arrange for this. He'll follow-up after for results and to discuss possible interventions if needed.  Return for after CT.  Nickie Retort, MD  [facility] [address] [place], [place] [address] [phone]

## 2017-02-22 ENCOUNTER — Ambulatory Visit: Payer: Medicaid Other | Admitting: Urology

## 2017-02-28 ENCOUNTER — Ambulatory Visit
Admission: RE | Admit: 2017-02-28 | Discharge: 2017-02-28 | Disposition: A | Discharge status: Home / Self Care | Payer: BLUE CROSS/BLUE SHIELD | Source: Ambulatory Visit | Attending: Urology | Admitting: Urology

## 2017-02-28 DIAGNOSIS — N1330 Unspecified hydronephrosis: Secondary | ICD-10-CM | POA: Diagnosis present

## 2017-02-28 DIAGNOSIS — N132 Hydronephrosis with renal and ureteral calculous obstruction: Secondary | ICD-10-CM | POA: Insufficient documentation

## 2017-02-28 DIAGNOSIS — Z87442 Personal history of urinary calculi: Secondary | ICD-10-CM

## 2017-03-09 DIAGNOSIS — I700 Atherosclerosis of aorta: Secondary | ICD-10-CM | POA: Insufficient documentation

## 2017-03-12 ENCOUNTER — Telehealth: Payer: Self-pay

## 2017-03-12 NOTE — Telephone Encounter (Signed)
Charles Retort, MD  [name], LPN        Please let patient know he has 4 mm stone in right ureter on CT scan. If he does not see stone pass in strainer, he will need a KUB and follow up in 2 weeks to ensure it passes. He has a few small stones in his kidneys that insignificant in size and nothing to worry about.   Thanks    Spoke with pt wife in reference to CT results and needing to use a strainer. Wife voiced understanding.

## 2017-03-13 ENCOUNTER — Ambulatory Visit: Payer: Medicaid Other

## 2017-03-13 ENCOUNTER — Other Ambulatory Visit: Payer: Self-pay | Admitting: Family Medicine

## 2017-03-13 DIAGNOSIS — R932 Abnormal findings on diagnostic imaging of liver and biliary tract: Secondary | ICD-10-CM

## 2017-03-20 ENCOUNTER — Ambulatory Visit
Admission: RE | Admit: 2017-03-20 | Discharge: 2017-03-20 | Disposition: A | Discharge status: Home / Self Care | Payer: BLUE CROSS/BLUE SHIELD | Source: Ambulatory Visit | Attending: Family Medicine | Admitting: Family Medicine

## 2017-03-20 DIAGNOSIS — R932 Abnormal findings on diagnostic imaging of liver and biliary tract: Secondary | ICD-10-CM | POA: Insufficient documentation

## 2017-10-12 ENCOUNTER — Other Ambulatory Visit
Admission: RE | Admit: 2017-10-12 | Discharge: 2017-10-12 | Disposition: A | Discharge status: Home / Self Care | Payer: BLUE CROSS/BLUE SHIELD | Source: Ambulatory Visit | Attending: Student | Admitting: Student

## 2017-10-12 DIAGNOSIS — R197 Diarrhea, unspecified: Secondary | ICD-10-CM | POA: Diagnosis not present

## 2017-10-12 LAB — GASTROINTESTINAL PANEL BY PCR, STOOL (REPLACES STOOL CULTURE)

## 2017-10-12 LAB — C DIFFICILE QUICK SCREEN W PCR REFLEX
C Diff antigen: NEGATIVE
C Diff interpretation: NOT DETECTED
C Diff toxin: NEGATIVE

## 2017-10-15 LAB — CALPROTECTIN, FECAL: Calprotectin, Fecal: 45 ug/g (ref 0–120)

## 2017-10-17 LAB — PANCREATIC ELASTASE, FECAL

## 2018-08-29 DIAGNOSIS — L301 Dyshidrosis [pompholyx]: Secondary | ICD-10-CM | POA: Insufficient documentation

## 2019-01-28 ENCOUNTER — Other Ambulatory Visit: Payer: Self-pay

## 2019-01-28 ENCOUNTER — Emergency Department: Payer: BLUE CROSS/BLUE SHIELD

## 2019-01-28 ENCOUNTER — Emergency Department
Admission: EM | Admit: 2019-01-28 | Discharge: 2019-01-28 | Disposition: A | Discharge status: Home / Self Care | Payer: BLUE CROSS/BLUE SHIELD | Attending: Emergency Medicine | Admitting: Emergency Medicine

## 2019-01-28 ENCOUNTER — Encounter: Payer: Self-pay | Admitting: Emergency Medicine

## 2019-01-28 DIAGNOSIS — N23 Unspecified renal colic: Secondary | ICD-10-CM | POA: Insufficient documentation

## 2019-01-28 DIAGNOSIS — J45909 Unspecified asthma, uncomplicated: Secondary | ICD-10-CM | POA: Insufficient documentation

## 2019-01-28 DIAGNOSIS — Z87891 Personal history of nicotine dependence: Secondary | ICD-10-CM | POA: Insufficient documentation

## 2019-01-28 DIAGNOSIS — Z79899 Other long term (current) drug therapy: Secondary | ICD-10-CM | POA: Insufficient documentation

## 2019-01-28 HISTORY — DX: Personal history of urinary calculi: Z87.442

## 2019-01-28 LAB — CBC WITH DIFFERENTIAL/PLATELET
Abs Immature Granulocytes: 0.12 10*3/uL — ABNORMAL HIGH (ref 0.00–0.07)
Basophils Absolute: 0.1 10*3/uL (ref 0.0–0.1)
Basophils Relative: 1 %
Eosinophils Absolute: 0.4 10*3/uL (ref 0.0–0.5)
Eosinophils Relative: 2 %
HCT: 41.6 % (ref 39.0–52.0)
Hemoglobin: 14.5 g/dL (ref 13.0–17.0)
Immature Granulocytes: 1 %
Lymphocytes Relative: 16 %
Lymphs Abs: 3.1 10*3/uL (ref 0.7–4.0)
MCH: 30.4 pg (ref 26.0–34.0)
MCHC: 34.9 g/dL (ref 30.0–36.0)
MCV: 87.2 fL (ref 80.0–100.0)
Monocytes Absolute: 0.9 10*3/uL (ref 0.1–1.0)
Monocytes Relative: 5 %
Neutro Abs: 14.5 10*3/uL — ABNORMAL HIGH (ref 1.7–7.7)
Neutrophils Relative %: 75 %
Platelets: 403 10*3/uL — ABNORMAL HIGH (ref 150–400)
RBC: 4.77 MIL/uL (ref 4.22–5.81)
RDW: 12.9 % (ref 11.5–15.5)
WBC: 19.1 10*3/uL — ABNORMAL HIGH (ref 4.0–10.5)
nRBC: 0 % (ref 0.0–0.2)

## 2019-01-28 LAB — COMPREHENSIVE METABOLIC PANEL
ALT: 15 U/L (ref 0–44)
AST: 28 U/L (ref 15–41)
Albumin: 4.6 g/dL (ref 3.5–5.0)
Alkaline Phosphatase: 74 U/L (ref 38–126)
Anion gap: 14 (ref 5–15)
BUN: 20 mg/dL (ref 6–20)
CO2: 20 mmol/L — ABNORMAL LOW (ref 22–32)
Calcium: 9.7 mg/dL (ref 8.9–10.3)
Chloride: 104 mmol/L (ref 98–111)
Creatinine, Ser: 1.24 mg/dL (ref 0.61–1.24)
GFR calc Af Amer: >60 mL/min (ref >60)
GFR calc non Af Amer: >60 mL/min (ref >60)
Glucose, Bld: 240 mg/dL — ABNORMAL HIGH (ref 70–99)
Potassium: 3.5 mmol/L (ref 3.5–5.1)
Sodium: 138 mmol/L (ref 135–145)
Total Bilirubin: 0.7 mg/dL (ref 0.3–1.2)
Total Protein: 8 g/dL (ref 6.5–8.1)

## 2019-01-28 LAB — URINALYSIS, ROUTINE W REFLEX MICROSCOPIC
Bacteria, UA: NONE SEEN
Bilirubin Urine: NEGATIVE
Glucose, UA: >=500 mg/dL — AB
Ketones, ur: 20 mg/dL — AB
Leukocytes,Ua: NEGATIVE
Nitrite: NEGATIVE
Protein, ur: 30 mg/dL — AB
RBC / HPF: >50 RBC/hpf — ABNORMAL HIGH (ref 0–5)
Specific Gravity, Urine: 1.025 (ref 1.005–1.030)
Squamous Epithelial / LPF: NONE SEEN (ref 0–5)
pH: 7 (ref 5.0–8.0)

## 2019-01-28 LAB — LIPASE, BLOOD: Lipase: 27 U/L (ref 11–51)

## 2019-01-28 MED ORDER — ONDANSETRON HCL 4 MG/2ML IJ SOLN
4.0000 mg | INTRAMUSCULAR | Status: AC
  Administered 2019-01-28: 4 mg via INTRAVENOUS
  Filled 2019-01-28: qty 2

## 2019-01-28 MED ORDER — KETOROLAC TROMETHAMINE 30 MG/ML IJ SOLN
15.0000 mg | Freq: Once | INTRAMUSCULAR | Status: AC
  Administered 2019-01-28: 15 mg via INTRAVENOUS
  Filled 2019-01-28: qty 1

## 2019-01-28 MED ORDER — DOCUSATE SODIUM 100 MG PO CAPS: ORAL_CAPSULE | ORAL | 0 refills | Status: DC

## 2019-01-28 MED ORDER — OXYCODONE-ACETAMINOPHEN 5-325 MG PO TABS
2.0000 | ORAL_TABLET | Freq: Once | ORAL | Status: AC
  Administered 2019-01-28: 2 via ORAL
  Filled 2019-01-28: qty 2

## 2019-01-28 MED ORDER — SODIUM CHLORIDE 0.9 % IV BOLUS
1000.0000 mL | Freq: Once | INTRAVENOUS | Status: AC
  Administered 2019-01-28: 1000 mL via INTRAVENOUS

## 2019-01-28 MED ORDER — HYDROMORPHONE HCL 1 MG/ML IJ SOLN
1.0000 mg | INTRAMUSCULAR | Status: AC
  Administered 2019-01-28: 1 mg via INTRAVENOUS
  Filled 2019-01-28: qty 1

## 2019-01-28 MED ORDER — TAMSULOSIN HCL 0.4 MG PO CAPS: ORAL_CAPSULE | ORAL | 0 refills | Status: DC

## 2019-01-28 MED ORDER — ONDANSETRON 4 MG PO TBDP: ORAL_TABLET | ORAL | 0 refills | Status: DC

## 2019-01-28 MED ORDER — OXYCODONE-ACETAMINOPHEN 5-325 MG PO TABS: 2.0000 | ORAL_TABLET | Freq: Four times a day (QID) | ORAL | 0 refills | Status: DC | PRN

## 2019-01-28 MED ORDER — SODIUM CHLORIDE 0.9 % IV SOLN
106.0000 mg | Freq: Once | INTRAVENOUS | Status: AC
  Administered 2019-01-28: 106 mg via INTRAVENOUS
  Filled 2019-01-28: qty 5.3

## 2019-01-28 NOTE — Discharge Instructions (Signed)
You have been seen in the Emergency Department (ED) today for pain that we believe based on your workup, is caused by kidney stones.  As we have discussed, please drink plenty of fluids.  Please make a follow up appointment with the physician(s) listed elsewhere in this documentation. ° °You may take pain medication as needed but ONLY as prescribed.  Please also take your prescribed Flomax daily.  We also recommend that you take over-the-counter ibuprofen regularly according to label instructions over the next 5 days.  Take it with meals to minimize stomach discomfort. ° °Please see your doctor as soon as possible as stones may take 1-3 weeks to pass and you may require additional care or medications. ° °Do not drink alcohol, drive or participate in any other potentially dangerous activities while taking opiate pain medication as it may make you sleepy. Do not take this medication with any other sedating medications, either prescription or over-the-counter. If you were prescribed Percocet or Vicodin, do not take these with acetaminophen (Tylenol) as it is already contained within these medications. °  °Take Percocet as needed for severe pain.  This medication is an opiate (or narcotic) pain medication and can be habit forming.  Use it as little as possible to achieve adequate pain control.  Do not use or use it with extreme caution if you have a history of opiate abuse or dependence.  If you are on a pain contract with your primary care doctor or a pain specialist, be sure to let them know you were prescribed this medication today from the [facility].  This medication is intended for your use only - do not give any to anyone else and keep it in a secure place where nobody else, especially children, have access to it.  It will also cause or worsen constipation, so you may want to consider taking an over-the-counter stool softener while you are taking this medication. ° °Return to the  Emergency Department (ED) or call your doctor if you have any worsening pain, fever, painful urination, are unable to urinate, or develop other symptoms that concern you. ° °

## 2019-01-28 NOTE — ED Provider Notes (Signed)
[facility] Emergency Department Provider Note  ____________________________________________   First MD Initiated Contact with Patient 01/28/19 [phone]     (approximate)  I have reviewed the triage vital signs and the nursing notes.   HISTORY  Chief Complaint Flank Pain    HPI Charles Beasley is a 43 y.o. male with medical history as listed below who presents by EMS for evaluation of severe sharp stabbing pain in his left flank rating into the left side of his lower abdomen accompanied with nausea and vomiting.  He says he has been having more minor episodes for the last week that he has been "handling it".  He is trying to wait because he recently lost his insurance due to being furloughed from the COVID-19 pandemic.  However tonight the pain was severe for the last hour and nothing particular made it better or worse.  He cannot find a position of comfort.  He has been shaking and shuddering due to the pain and the nausea.  He denies any contact with coronavirus patients and denies sore throat, cough, congestion, shortness of breath.  He has no abdominal pain except for the pain radiating around from his left flank.  He has not seen any blood in his urine and has no dysuria.  He has a history of kidney stones in the past and this feels similar but is on the opposite side for what he usually experiences.         Past Medical History:  Diagnosis Date   Asthma    Diverticulosis    History of kidney stones    IBS (irritable bowel syndrome)     There are no active problems to display for this patient.   History reviewed. No pertinent surgical history.  Prior to Admission medications   Medication Sig Start Date End Date Taking? Authorizing Provider  albuterol (PROVENTIL HFA;VENTOLIN HFA) 108 (90 BASE) MCG/ACT inhaler Inhale 2 puffs into the lungs every 6 (six) hours as needed. Reported on 11/08/2015    [provider]  docusate sodium (COLACE) 100 MG  capsule Take 1 tablet once or twice daily as needed for constipation while taking narcotic pain medicine 01/28/19   Hinda Kehr, MD  fexofenadine (ALLEGRA) 180 MG tablet Take 180 mg by mouth daily.    [provider]  ketorolac (TORADOL) 10 MG tablet Take 1 tablet (10 mg total) by mouth every 8 (eight) hours as needed for moderate pain. 01/30/17   Lisa Roca, MD  meloxicam (MOBIC) 7.5 MG tablet Take 7.5 mg by mouth daily.     [provider]  metFORMIN (GLUCOPHAGE) 500 MG tablet Take 1 tablet (500 mg total) by mouth 2 (two) times daily with a meal. 10/15/15 02/02/17  Schaevitz, Randall An, MD  omeprazole (PRILOSEC) 40 MG capsule Take 1 capsule by mouth daily. 03/23/15 02/02/17  [provider]  ondansetron (ZOFRAN ODT) 4 MG disintegrating tablet Allow 1-2 tablets to dissolve in your mouth every 8 hours as needed for nausea/vomiting 01/28/19   Hinda Kehr, MD  ondansetron (ZOFRAN) 4 MG tablet Take 1 tablet (4 mg total) by mouth every 8 (eight) hours as needed for nausea or vomiting. 01/30/17   Lisa Roca, MD  oxyCODONE-acetaminophen (PERCOCET) 5-325 MG tablet Take 2 tablets by mouth every 6 (six) hours as needed for severe pain. 01/28/19   Hinda Kehr, MD  tamsulosin (FLOMAX) 0.4 MG CAPS capsule Take 1 tablet by mouth daily until you pass the kidney stone or no longer  have symptoms 01/28/19   Hinda Kehr, MD    Allergies Montelukast and Sertraline  Family History  Problem Relation Age of Onset   Prostate cancer Neg Hx    Bladder Cancer Neg Hx    Kidney cancer Neg Hx     Social History Social History   Tobacco Use   Smoking status: Former Smoker    Types: Cigarettes   Smokeless tobacco: Never Used  Substance Use Topics   Alcohol use: No    Alcohol/week: 0.0 standard drinks   Drug use: No    Review of Systems Constitutional: No fever/chills Eyes: No visual changes. ENT: No sore throat. Cardiovascular: Denies chest pain. Respiratory: Denies  shortness of breath. Gastrointestinal: Pain in the left lower quadrant of his abdomen radiating from the left flank.  Multiple episodes of nausea and vomiting. Genitourinary: Negative for dysuria. Musculoskeletal: Severe left flank pain.   Integumentary: Negative for rash. Neurological: Negative for headaches, focal weakness or numbness.   ____________________________________________   PHYSICAL EXAM:  VITAL SIGNS: ED Triage Vitals  Enc Vitals Group     BP 01/28/19 0352 (!) 155/96     Pulse Rate 01/28/19 0352 95     Resp 01/28/19 0352 18     Temp 01/28/19 0352 98.4 F (36.9 C)     Temp Source 01/28/19 0352 Oral     SpO2 01/28/19 0352 100 %     Weight 01/28/19 0353 70.8 kg (156 lb)     Height 01/28/19 0353 1.753 m (5\' 9" )     Head Circumference --      Peak Flow --      Pain Score 01/28/19 0353 10     Pain Loc --      Pain Edu? --      Excl. in [place]? --     Constitutional: Alert and oriented.  Appears to be in severe pain. Eyes: Conjunctivae are normal.  Head: Atraumatic. Nose: No congestion/rhinnorhea. Mouth/Throat: Mucous membranes are moist. Neck: No stridor.  No meningeal signs.   Cardiovascular: Normal rate, regular rhythm. Good peripheral circulation. Grossly normal heart sounds. Respiratory: Normal respiratory effort.  No retractions. No audible wheezing. Gastrointestinal: Soft and nontender. No distention.  Musculoskeletal: Left CVA tenderness to percussion.  No lower extremity tenderness nor edema. No gross deformities of extremities. Neurologic:  Normal speech and language. No gross focal neurologic deficits are appreciated.  Skin:  Skin is warm, mildly diaphoretic and intact. No rash noted. Psychiatric: Mood and affect are normal. Speech and behavior are normal.  ____________________________________________   LABS (all labs ordered are listed, but only abnormal results are displayed)  Labs Reviewed  CBC WITH DIFFERENTIAL/PLATELET - Abnormal; Notable for the  following components:      Result Value   WBC 19.1 (*)    Platelets 403 (*)    Neutro Abs 14.5 (*)    Abs Immature Granulocytes 0.12 (*)    All other components within normal limits  COMPREHENSIVE METABOLIC PANEL - Abnormal; Notable for the following components:   CO2 20 (*)    Glucose, Bld 240 (*)    All other components within normal limits  LIPASE, BLOOD  URINALYSIS, ROUTINE W REFLEX MICROSCOPIC   ____________________________________________  EKG  ED ECG REPORT I, Hinda Kehr, the attending physician, personally viewed and interpreted this ECG.  Date: 01/28/2019 EKG Time: 3:50 AM Rate: 103 Rhythm: Sinus tachycardia QRS Axis: normal Intervals: normal with LVH ST/T Wave abnormalities: normal Narrative Interpretation: no evidence of acute ischemia  ____________________________________________  RADIOLOGY   ED MD interpretation:  3-mm left distal ureteral obstructive stone  Official radiology report(s): Ct Renal Stone Study  Result Date: 01/28/2019 CLINICAL DATA:  Recurrent flank pain on the left EXAM: CT ABDOMEN AND PELVIS WITHOUT CONTRAST TECHNIQUE: Multidetector CT imaging of the abdomen and pelvis was performed following the standard protocol without IV contrast. COMPARISON:  02/28/2017 FINDINGS: Lower chest:  No contributory findings. Hepatobiliary: Steatosis in the right liver also suggested on prior.No evidence of biliary obstruction or stone. Pancreas: Unremarkable. Spleen: Unremarkable. Adrenals/Urinary Tract: Negative adrenals. Mild left hydronephrosis and hydroureter with left renal low-density expansion due to a 3 mm stone in the distal ureter. Punctate left renal calculus on coronal reformats. Unremarkable bladder. Stomach/Bowel: No obstruction. No appendicitis. Sigmoid diverticulosis. Vascular/Lymphatic: No acute vascular abnormality. Atherosclerotic calcification that is notable for age. No mass or adenopathy. Reproductive:Negative. Other: No ascites or  pneumoperitoneum. Musculoskeletal: No acute abnormalities. IMPRESSION: 1. Obstructing 3 mm distal left ureteral calculus. 2. Sigmoid diverticulosis. Electronically Signed   By: Monte Fantasia M.D.   On: 01/28/2019 04:49    ____________________________________________   PROCEDURES   Procedure(s) performed (including Critical Care):  Procedures   ____________________________________________   INITIAL IMPRESSION / MDM / [place] / ED COURSE  As part of my medical decision making, I reviewed the following data within the [place] notes reviewed and incorporated, Labs reviewed , Old chart reviewed, Notes from prior ED visits and [place] Controlled Substance Database      *DARRIS STAIGER was evaluated in Emergency Department on 01/28/2019 for the symptoms described in the history of present illness. He was evaluated in the context of the global COVID-19 pandemic, which necessitated consideration that the patient might be at risk for infection with the SARS-CoV-2 virus that causes COVID-19. Institutional protocols and algorithms that pertain to the evaluation of patients at risk for COVID-19 are in a state of rapid change based on information released by regulatory bodies including the CDC and federal and state organizations. These policies and algorithms were followed during the patient's care in the ED.  Some ED evaluations and interventions may be delayed as a result of limited staffing during the pandemic.*  Differential diagnosis includes, but is not limited to, ureteral colic, UTI/pyelonephritis, musculoskeletal pain/strain, less likely renal infarction or renal abscess.  The patient's symptoms are consistent with ureteral colic and he is currently in severe distress but is afebrile and only borderline tachycardic.  I have ordered 1 L normal saline, Dilaudid 1 mg IV, and Zofran 4 mg IV.  Lab work is pending and I have ordered a CT renal stone study of the  abdomen and pelvis.  EKG is nonischemic.  Clinical Course as of Jan 28 640  Tue Jan 28, 2019  0406 WBC(!): 19.1 [CF]  0443 Essentially normal comprehensive metabolic panel except for some hyperglycemia.  Comprehensive metabolic panel(!) [CF]  3267 No leukocytosis and thrombocytosis likely represents volume depletion in the setting of persistent nausea and vomiting and I am already addressing it with a normal saline IV bolus of 1 L.  Platelets(!): 403 [CF]  0444 Lipase: 27 [CF]  0513 3 mm obstructive distal left ureteral stone.  Patient is feeling a little bit better after Dilaudid but is still in moderate to severe pain.  It is more than 48 hours since he would even be eligible for lithotripsy, so I am ordering Toradol 15 mg IV as well as lidocaine for renal colic (1.5 mg/kg).  I am  also ordering a second liter normal saline given I think he is volume depleted and is still mildly tachycardic at about 105.  He has not yet provided a urine specimen but he believes he will be able to do so shortly.  CT Renal Laren Everts [CF]  8421 After the lidocaine and Toradol the patient is feeling much better and so the pain is minimal.  He is no longer tachycardic.  He is comfortable going home.  However he has still not provided a urine specimen.  This paperwork is ready to go assuming he does not need any antibiotics.  He can be discharged as soon as his urinalysis comes back as long as it is consistent only with hematuria.   [CF]    Clinical Course User Index [CF] Hinda Kehr, MD     ____________________________________________  FINAL CLINICAL IMPRESSION(S) / ED DIAGNOSES  Final diagnoses:  Ureteral colic     MEDICATIONS GIVEN DURING THIS VISIT:  Medications  HYDROmorphone (DILAUDID) injection 1 mg (1 mg Intravenous Given 01/28/19 0404)  ondansetron (ZOFRAN) injection 4 mg (4 mg Intravenous Given 01/28/19 0404)  sodium chloride 0.9 % bolus 1,000 mL (0 mLs Intravenous Stopped 01/28/19 0557)    sodium chloride 0.9 % bolus 1,000 mL (0 mLs Intravenous Stopped 01/28/19 0601)  lidocaine (XYLOCAINE) 106 mg in sodium chloride 0.9 % 100 mL IVPB (0 mg Intravenous Stopped 01/28/19 0601)  ketorolac (TORADOL) 30 MG/ML injection 15 mg (15 mg Intravenous Given 01/28/19 0530)     ED Discharge Orders         Ordered    oxyCODONE-acetaminophen (PERCOCET) 5-325 MG tablet  Every 6 hours PRN     01/28/19 0547    ondansetron (ZOFRAN ODT) 4 MG disintegrating tablet     01/28/19 0547    tamsulosin (FLOMAX) 0.4 MG CAPS capsule     01/28/19 0547    docusate sodium (COLACE) 100 MG capsule     01/28/19 0547           Note:  This document was prepared using Dragon voice recognition software and may include unintentional dictation errors.   Hinda Kehr, MD 01/28/19 [phone]

## 2019-01-28 NOTE — ED Triage Notes (Signed)
Pt has a hx of kidney stones and for the past week he has been having increased left sided pain. Tonight the pain was excruciating.

## 2019-06-13 ENCOUNTER — Other Ambulatory Visit: Payer: Self-pay

## 2019-06-13 DIAGNOSIS — Z20822 Contact with and (suspected) exposure to covid-19: Secondary | ICD-10-CM

## 2019-06-15 LAB — NOVEL CORONAVIRUS, NAA: SARS-CoV-2, NAA: NOT DETECTED

## 2019-07-04 ENCOUNTER — Other Ambulatory Visit: Payer: Self-pay

## 2019-07-04 ENCOUNTER — Emergency Department: Payer: BC Managed Care – PPO

## 2019-07-04 ENCOUNTER — Emergency Department
Admission: EM | Admit: 2019-07-04 | Discharge: 2019-07-05 | Disposition: A | Discharge status: Home / Self Care | Payer: BC Managed Care – PPO | Attending: Emergency Medicine | Admitting: Emergency Medicine

## 2019-07-04 DIAGNOSIS — R1012 Left upper quadrant pain: Secondary | ICD-10-CM | POA: Insufficient documentation

## 2019-07-04 DIAGNOSIS — Z87891 Personal history of nicotine dependence: Secondary | ICD-10-CM | POA: Diagnosis not present

## 2019-07-04 DIAGNOSIS — R531 Weakness: Secondary | ICD-10-CM | POA: Diagnosis present

## 2019-07-04 DIAGNOSIS — M79672 Pain in left foot: Secondary | ICD-10-CM | POA: Insufficient documentation

## 2019-07-04 DIAGNOSIS — Z79899 Other long term (current) drug therapy: Secondary | ICD-10-CM | POA: Insufficient documentation

## 2019-07-04 DIAGNOSIS — Z7984 Long term (current) use of oral hypoglycemic drugs: Secondary | ICD-10-CM | POA: Insufficient documentation

## 2019-07-04 DIAGNOSIS — R1011 Right upper quadrant pain: Secondary | ICD-10-CM | POA: Insufficient documentation

## 2019-07-04 DIAGNOSIS — J45909 Unspecified asthma, uncomplicated: Secondary | ICD-10-CM | POA: Diagnosis not present

## 2019-07-04 DIAGNOSIS — R109 Unspecified abdominal pain: Secondary | ICD-10-CM

## 2019-07-04 LAB — URINALYSIS, COMPLETE (UACMP) WITH MICROSCOPIC
Bacteria, UA: NONE SEEN
Bilirubin Urine: NEGATIVE
Glucose, UA: >=500 mg/dL — AB
Hgb urine dipstick: NEGATIVE
Ketones, ur: 5 mg/dL — AB
Leukocytes,Ua: NEGATIVE
Nitrite: NEGATIVE
Protein, ur: NEGATIVE mg/dL
Specific Gravity, Urine: 1.03 (ref 1.005–1.030)
Squamous Epithelial / HPF: NONE SEEN (ref 0–5)
pH: 5 (ref 5.0–8.0)

## 2019-07-04 LAB — COMPREHENSIVE METABOLIC PANEL
ALT: 13 U/L (ref 0–44)
AST: 16 U/L (ref 15–41)
Albumin: 4.6 g/dL (ref 3.5–5.0)
Alkaline Phosphatase: 70 U/L (ref 38–126)
Anion gap: 11 (ref 5–15)
BUN: 11 mg/dL (ref 6–20)
CO2: 23 mmol/L (ref 22–32)
Calcium: 9.3 mg/dL (ref 8.9–10.3)
Chloride: 105 mmol/L (ref 98–111)
Creatinine, Ser: 0.72 mg/dL (ref 0.61–1.24)
GFR calc Af Amer: >60 mL/min (ref >60)
GFR calc non Af Amer: >60 mL/min (ref >60)
Glucose, Bld: 244 mg/dL — ABNORMAL HIGH (ref 70–99)
Potassium: 3.9 mmol/L (ref 3.5–5.1)
Sodium: 139 mmol/L (ref 135–145)
Total Bilirubin: 0.3 mg/dL (ref 0.3–1.2)
Total Protein: 7.9 g/dL (ref 6.5–8.1)

## 2019-07-04 LAB — CBC WITH DIFFERENTIAL/PLATELET
Abs Immature Granulocytes: 0.08 10*3/uL — ABNORMAL HIGH (ref 0.00–0.07)
Basophils Absolute: 0.1 10*3/uL (ref 0.0–0.1)
Basophils Relative: 1 %
Eosinophils Absolute: 0.5 10*3/uL (ref 0.0–0.5)
Eosinophils Relative: 4 %
HCT: 42.2 % (ref 39.0–52.0)
Hemoglobin: 14.2 g/dL (ref 13.0–17.0)
Immature Granulocytes: 1 %
Lymphocytes Relative: 27 %
Lymphs Abs: 3.6 10*3/uL (ref 0.7–4.0)
MCH: 30.3 pg (ref 26.0–34.0)
MCHC: 33.6 g/dL (ref 30.0–36.0)
MCV: 90 fL (ref 80.0–100.0)
Monocytes Absolute: 0.6 10*3/uL (ref 0.1–1.0)
Monocytes Relative: 5 %
Neutro Abs: 8.2 10*3/uL — ABNORMAL HIGH (ref 1.7–7.7)
Neutrophils Relative %: 62 %
Platelets: 432 10*3/uL — ABNORMAL HIGH (ref 150–400)
RBC: 4.69 MIL/uL (ref 4.22–5.81)
RDW: 12.9 % (ref 11.5–15.5)
WBC: 13 10*3/uL — ABNORMAL HIGH (ref 4.0–10.5)
nRBC: 0 % (ref 0.0–0.2)

## 2019-07-04 LAB — LIPASE, BLOOD: Lipase: 30 U/L (ref 11–51)

## 2019-07-04 NOTE — ED Provider Notes (Signed)
[facility] Emergency Department Provider Note  ____________________________________________  Time seen: Approximately 8:29 PM  The following is a medical screening exam note. It is intended that the patient await an ER room assignment for detailed exam, diagnosis, and disposition.  I have reviewed the triage vital signs.    HISTORY  Chief Complaint Abdominal Pain and Abscess   HPI Charles Beasley is a 43 y.o. male that presents to the emergency department for evaluation of weakness, bilateral side pain, urinary frequency worse for 3 days.  Patient's blood sugar has also been high.  It has been in the 300s and 400s.  Patient states that he also has had a painful left foot for several years.  Patient had a procedure done with urology recently.  No shortness of breath, chest pain.  No known fevers.  Past Medical History:  Diagnosis Date  . Asthma   . Diverticulosis   . History of kidney stones   . IBS (irritable bowel syndrome)     There are no active problems to display for this patient.   No past surgical history on file.  Prior to Admission medications   Medication Sig Start Date End Date Taking? Authorizing Provider  albuterol (PROVENTIL HFA;VENTOLIN HFA) 108 (90 BASE) MCG/ACT inhaler Inhale 2 puffs into the lungs every 6 (six) hours as needed. Reported on 11/08/2015    [provider]  docusate sodium (COLACE) 100 MG capsule Take 1 tablet once or twice daily as needed for constipation while taking narcotic pain medicine 01/28/19   Hinda Kehr, MD  fexofenadine (ALLEGRA) 180 MG tablet Take 180 mg by mouth daily.    [provider]  ketorolac (TORADOL) 10 MG tablet Take 1 tablet (10 mg total) by mouth every 8 (eight) hours as needed for moderate pain. 01/30/17   Lisa Roca, MD  meloxicam (MOBIC) 7.5 MG tablet Take 7.5 mg by mouth daily.     [provider]  metFORMIN (GLUCOPHAGE) 500 MG tablet Take 1 tablet (500 mg total) by  mouth 2 (two) times daily with a meal. 10/15/15 02/02/17  Schaevitz, Randall An, MD  omeprazole (PRILOSEC) 40 MG capsule Take 1 capsule by mouth daily. 03/23/15 02/02/17  [provider]  ondansetron (ZOFRAN ODT) 4 MG disintegrating tablet Allow 1-2 tablets to dissolve in your mouth every 8 hours as needed for nausea/vomiting 01/28/19   Hinda Kehr, MD  ondansetron (ZOFRAN) 4 MG tablet Take 1 tablet (4 mg total) by mouth every 8 (eight) hours as needed for nausea or vomiting. 01/30/17   Lisa Roca, MD  oxyCODONE-acetaminophen (PERCOCET) 5-325 MG tablet Take 2 tablets by mouth every 6 (six) hours as needed for severe pain. 01/28/19   Hinda Kehr, MD  tamsulosin (FLOMAX) 0.4 MG CAPS capsule Take 1 tablet by mouth daily until you pass the kidney stone or no longer have symptoms 01/28/19   Hinda Kehr, MD    Allergies Montelukast and Sertraline  Family History  Problem Relation Age of Onset  . Prostate cancer Neg Hx   . Bladder Cancer Neg Hx   . Kidney cancer Neg Hx     Social History Social History   Tobacco Use  . Smoking status: Former Smoker    Types: Cigarettes  . Smokeless tobacco: Never Used  Substance Use Topics  . Alcohol use: No    Alcohol/week: 0.0 standard drinks  . Drug use: No    Review of Systems Constitutional: Negative for fever. Respiratory: No shortness of breath. Gastrointestinal: Positive  for abdominal pain.  No nausea, no vomiting.   Skin: Negative for rash/lesion/wound. Neurological: Negative for headaches, focal weakness or numbness.  ____________________________________________   PHYSICAL EXAM:  VITAL SIGNS:  Vitals:   07/04/19 1952  BP: (!) 126/92  Pulse: 93  Resp: 18  Temp: 98.7 F (37.1 C)  SpO2: 97%    Constitutional: Alert and oriented. No acute distress. Head: Atraumatic. Nose: No congestion/rhinnorhea. Mouth/Throat: Mucous membranes are moist. Neck: No stridor.  Cardiovascular: Good peripheral circulation. Respiratory:  Normal respiratory effort. Musculoskeletal: No restriction  Neurologic:  Normal speech and language. No gross focal neurologic deficits are appreciated. Speech is normal. No gait instability. Skin:  Skin is warm, dry and intact. No rash noted.  No skin changes or wound to left foot. Psychiatric: Mood and affect are normal. Speech and behavior are normal.  ____________________________________________   LABS (all labs ordered are listed, but only abnormal results are displayed)  Labs Reviewed  COMPREHENSIVE METABOLIC PANEL - Abnormal; Notable for the following components:      Result Value   Glucose, Bld 244 (*)    All other components within normal limits  CBC WITH DIFFERENTIAL/PLATELET - Abnormal; Notable for the following components:   WBC 13.0 (*)    Platelets 432 (*)    Neutro Abs 8.2 (*)    Abs Immature Granulocytes 0.08 (*)    All other components within normal limits  URINALYSIS, COMPLETE (UACMP) WITH MICROSCOPIC - Abnormal; Notable for the following components:   Color, Urine YELLOW (*)    APPearance CLEAR (*)    Glucose, UA >=500 (*)    Ketones, ur 5 (*)    All other components within normal limits  LIPASE, BLOOD   ____________________________________________  EKG    INITIAL CLINICAL IMPRESSION(S)   Patient presents emergency department for evaluation of multiple concerns.  Vital signs are reassuring.  Basic lab work, foot x-ray, chest x-ray, EKG were ordered.   Laban Emperor, PA-C 07/04/19 2201    Delman Kitten, MD 07/04/19 [phone]

## 2019-07-04 NOTE — ED Notes (Signed)
Assessment: pt states he had bilateral abdominal pain for days, "something on my bladder", and a spot on his left foot. Pt with area of swelling noted to left lateral foot that is soft and has some bruising noted. Pt states he is having urinary frequency.

## 2019-07-04 NOTE — ED Provider Notes (Signed)
[facility] Emergency Department Provider Note  ____________________________________________   First MD Initiated Contact with Patient 07/04/19 2345     (approximate)  I have reviewed the triage vital signs and the nursing notes.   HISTORY  Chief Complaint Abdominal Pain and Abscess    HPI Charles Beasley is a 43 y.o. male with a complicated urological history that includes recurrent kidney/ureteral stones previously requiring multiple stents and lithotripsy as well as ongoing concern by [facility] urology for the possibility of bladder cancer for which he has a cystoscopy scheduled at [facility] urology in January.  He presents tonight for persistent pain on both sides of his back and abdomen that he says went away after his last kidney stone and stents were removed but came back a few weeks ago.  It is gotten to be severe of the last couple of days and he says he could not take it anymore tonight and came in.  He is also wanting evaluation of a bump on the top of his left foot that he says is also very painful and making it difficult for him to walk.  This  area on his foot has apparently been present "for years".  He has had no trauma of which he is aware.  He reports that the pain in his sides and abdomen are similar to prior kidney stone pain and has been on and off for the last few months.  He last had his stents placed about 2 months ago and he had a follow-up appointment with urology a month ago.  Since that time he has had worsening and more consistent pain.  He says that it is accompanied with occasional nausea and vomiting.  He denies dysuria and gross hematuria.  He denies fever/chills, sore throat, chest pain, cough, shortness of breath.  His pain is similar to prior episodes of ureteral colic.  He denies contact with COVID-19 patients.  His follow-up appointment for his cystoscopy is not for another 1+ month.        Past Medical History:  Diagnosis Date    Asthma    Diverticulosis    History of kidney stones    IBS (irritable bowel syndrome)     There are no active problems to display for this patient.   No past surgical history on file.  Prior to Admission medications   Medication Sig Start Date End Date Taking? Authorizing Provider  albuterol (PROVENTIL HFA;VENTOLIN HFA) 108 (90 BASE) MCG/ACT inhaler Inhale 2 puffs into the lungs every 6 (six) hours as needed. Reported on 11/08/2015    [provider]  docusate sodium (COLACE) 100 MG capsule Take 1 tablet once or twice daily as needed for constipation while taking narcotic pain medicine 01/28/19   Hinda Kehr, MD  fexofenadine (ALLEGRA) 180 MG tablet Take 180 mg by mouth daily.    [provider]  ketorolac (TORADOL) 10 MG tablet Take 1 tablet (10 mg total) by mouth every 8 (eight) hours as needed for moderate pain. 01/30/17   Lisa Roca, MD  meloxicam (MOBIC) 7.5 MG tablet Take 7.5 mg by mouth daily.     [provider]  metFORMIN (GLUCOPHAGE) 500 MG tablet Take 1 tablet (500 mg total) by mouth 2 (two) times daily with a meal. 10/15/15 02/02/17  Schaevitz, Randall An, MD  omeprazole (PRILOSEC) 40 MG capsule Take 1 capsule by mouth daily. 03/23/15 02/02/17  [provider]  ondansetron (ZOFRAN ODT) 4 MG disintegrating tablet Allow 1-2  tablets to dissolve in your mouth every 8 hours as needed for nausea/vomiting 01/28/19   Hinda Kehr, MD  ondansetron (ZOFRAN) 4 MG tablet Take 1 tablet (4 mg total) by mouth every 8 (eight) hours as needed for nausea or vomiting. 01/30/17   Lisa Roca, MD  oxyCODONE-acetaminophen (PERCOCET) 5-325 MG tablet Take 2 tablets by mouth every 6 (six) hours as needed for severe pain. 01/28/19   Hinda Kehr, MD  tamsulosin (FLOMAX) 0.4 MG CAPS capsule Take 1 tablet by mouth daily until you pass the kidney stone or no longer have symptoms 01/28/19   Hinda Kehr, MD    Allergies Montelukast and Sertraline  Family History    Problem Relation Age of Onset   Prostate cancer Neg Hx    Bladder Cancer Neg Hx    Kidney cancer Neg Hx     Social History Social History   Tobacco Use   Smoking status: Former Smoker    Types: Cigarettes   Smokeless tobacco: Never Used  Substance Use Topics   Alcohol use: No    Alcohol/week: 0.0 standard drinks   Drug use: No    Review of Systems Constitutional: No fever/chills Eyes: No visual changes. ENT: No sore throat. Cardiovascular: Denies chest pain. Respiratory: Denies shortness of breath. Gastrointestinal: Pain in bilateral sides for weeks but worse over the last few days consistent with prior kidney stones.  Accompanied with occasional nausea and vomiting. Genitourinary: Negative for dysuria. Musculoskeletal: Pain in the left foot for "years", worse recently.  Pain in bilateral sides 4 weeks but worse over the last few days consistent with prior kidney stones. Integumentary: Negative for rash. Neurological: Negative for headaches, focal weakness or numbness.   ____________________________________________   PHYSICAL EXAM:  VITAL SIGNS: ED Triage Vitals  Enc Vitals Group     BP 07/04/19 1952 (!) 126/92     Pulse Rate 07/04/19 1952 93     Resp 07/04/19 1952 18     Temp 07/04/19 1952 98.7 F (37.1 C)     Temp Source 07/04/19 1952 Oral     SpO2 07/04/19 1952 97 %     Weight 07/04/19 1948 70.8 kg (156 lb)     Height 07/04/19 1948 1.753 m (5\' 9" )     Head Circumference --      Peak Flow --      Pain Score 07/04/19 1948 9     Pain Loc --      Pain Edu? --      Excl. in [place]? --     Constitutional: Alert and oriented.  Appears to be in pain, grimacing and moaning slightly. Eyes: Conjunctivae are normal.  Head: Atraumatic. Nose: No congestion/rhinnorhea. Mouth/Throat: Patient is wearing a mask. Neck: No stridor.  No meningeal signs.   Cardiovascular: Normal rate, regular rhythm. Good peripheral circulation. Grossly normal heart  sounds. Respiratory: Normal respiratory effort.  No retractions. Gastrointestinal: Soft and nondistended with diffuse tenderness to palpation of the abdomen without any specific focal tenderness. Musculoskeletal: Bilateral CVA tenderness to mild percussion.  Left foot is normal in appearance, no bruising, no abscess, no erythema, no cellulitis, no foot wounds or ulcers.  There is some focal tenderness to the top lateral part of his foot where there seems to be a little bit of soft tissue swelling, almost like a bruise or subacute hematoma, but no evidence of any acute or emergent abnormality. Neurologic:  Normal speech and language. No gross focal neurologic deficits are appreciated.  Skin:  Skin is  warm, dry and intact. Psychiatric: Mood and affect are normal. Speech and behavior are normal.  ____________________________________________   LABS (all labs ordered are listed, but only abnormal results are displayed)  Labs Reviewed  COMPREHENSIVE METABOLIC PANEL - Abnormal; Notable for the following components:      Result Value   Glucose, Bld 244 (*)    All other components within normal limits  CBC WITH DIFFERENTIAL/PLATELET - Abnormal; Notable for the following components:   WBC 13.0 (*)    Platelets 432 (*)    Neutro Abs 8.2 (*)    Abs Immature Granulocytes 0.08 (*)    All other components within normal limits  URINALYSIS, COMPLETE (UACMP) WITH MICROSCOPIC - Abnormal; Notable for the following components:   Color, Urine YELLOW (*)    APPearance CLEAR (*)    Glucose, UA >=500 (*)    Ketones, ur 5 (*)    All other components within normal limits  LIPASE, BLOOD   ____________________________________________  EKG  ED ECG REPORT I, Hinda Kehr, the attending physician, personally viewed and interpreted this ECG.  Date: 07/04/2019 EKG Time: 19: 50 Rate: 90 Rhythm: normal sinus rhythm QRS Axis: normal Intervals: normal with left ventricular hypertrophy ST/T Wave abnormalities:  normal Narrative Interpretation: no evidence of acute ischemia  ____________________________________________  RADIOLOGY I, Hinda Kehr, personally viewed and evaluated these images (plain radiographs) as part of my medical decision making, as well as reviewing the written report by the radiologist.  ED MD interpretation: No indication of acute abnormalities on x-rays of the chest nor the foot.  No acute abnormalities identified on CT.  Official radiology report(s): Dg Chest 2 View  Result Date: 07/04/2019 CLINICAL DATA:  Bilateral side pain. EXAM: CHEST - 2 VIEW COMPARISON:  05/29/2005 FINDINGS: The heart size and mediastinal contours are within normal limits. Both lungs are clear. The visualized skeletal structures are unremarkable. IMPRESSION: No acute cardiopulmonary disease. Electronically Signed   By: Zetta Bills M.D.   On: 07/04/2019 20:27   Dg Foot Complete Left  Result Date: 07/04/2019 CLINICAL DATA:  Foot pain, area on the side of left foot, hurts while walking started this morning but been there for years by report. EXAM: LEFT FOOT - COMPLETE 3+ VIEW COMPARISON:  None. FINDINGS: There is no evidence of fracture or dislocation. There is no evidence of arthropathy or other focal bone abnormality. Soft tissues are unremarkable. IMPRESSION: Negative evaluation of the left foot. Electronically Signed   By: Zetta Bills M.D.   On: 07/04/2019 20:26   Ct Renal Stone Study  Result Date: 07/05/2019 CLINICAL DATA:  Weakness and bilateral flank pain. Urinary frequency. EXAM: CT ABDOMEN AND PELVIS WITHOUT CONTRAST TECHNIQUE: Multidetector CT imaging of the abdomen and pelvis was performed following the standard protocol without IV contrast. COMPARISON:  January 28, 2019 FINDINGS: Lower chest: No acute abnormality. Hepatobiliary: No focal liver abnormality is seen. No gallstones, gallbladder wall thickening, or biliary dilatation. Pancreas: Unremarkable. No pancreatic ductal dilatation or  surrounding inflammatory changes. Spleen: Normal in size without focal abnormality. Adrenals/Urinary Tract: Tiny stones are seen in both kidneys with at least 3 tiny stones on the right and 1 on the left. No perinephric stranding or hydronephrosis identified. No ureterectasis or ureteral stones. The bladder is normal. Stomach/Bowel: Stomach is within normal limits. Appendix appears normal. No evidence of bowel wall thickening, distention, or inflammatory changes. Vascular/Lymphatic: Calcified atherosclerosis is seen in the nonaneurysmal aorta. No adenopathy. Reproductive: Prostate is unremarkable. Other: No abdominal wall hernia or abnormality. No  abdominopelvic ascites. Musculoskeletal: No acute or significant osseous findings. IMPRESSION: 1. Nonobstructive stones are seen in both kidneys. 2. Calcified atherosclerosis in the nonaneurysmal aorta. 3. No other acute abnormalities. Electronically Signed   By: Dorise Bullion III M.D   On: 07/05/2019 01:15    ____________________________________________   PROCEDURES   Procedure(s) performed (including Critical Care):  Procedures   ____________________________________________   INITIAL IMPRESSION / MDM / [place] / ED COURSE  As part of my medical decision making, I reviewed the following data within the [place] notes reviewed and incorporated, Labs reviewed , EKG interpreted , Old chart reviewed, Radiograph reviewed , Notes from prior ED visits and  Controlled Substance Database   Differential diagnosis includes, but is not limited to, ureteral colic from recurrent kidney stones, bladder spasms, bladder neoplasm, other acute infectious process in the abdomen such as diverticulitis or appendicitis, acute on chronic pain.  The foot x-rays are reassuring and showed no bony abnormality and the physical exam is reassuring as well and I explained to him he has some musculoskeletal pain of the foot but there is  nothing emergent on which to intervene.  Regarding his main issue tonight, what seems to be recurrent ureteral colic, given his complicated history, I will obtain a CT renal stone protocol to see if he has any new stones.  I have seen this patient in the past and he responded to a combination of Dilaudid, Toradol, and eventually lidocaine IV.  I will start tonight with Dilaudid and Toradol and a liter of fluids.  His work-up is reassuring with a normal comprehensive metabolic panel, no evidence of infection or blood on urinalysis, normal lipase, and a normal CBC except for mild leukocytosis of 13.  We had my usual discussion about management of expectations, and I explained that I will try to make his pain better but resolving the pain completely is unlikely given his chronic conditions and the fact that he has been having pain for weeks even though the symptoms have recently gotten worse.  He says he understands the plan.  [place] controlled substance database does show multiple prior prescriptions but none in the last 2 months since he was seen by urology and had stents placed which is reassuring.      Clinical Course as of Jul 05 447  Sat Jul 05, 2019  0135 Patient is reporting that his pain is no better after Dilaudid although he is lying in bed comfortably without tachycardia and appears more comfortable than prior.  Given his history I have ordered lidocaine 1.5 mg/kg as per protocol for renal colic treatment although there is no evidence of an obstructive ureteral stone on his CT scan.  Theoretically he could have had another stone that he passed because he does have bilateral intrarenal stones but these are not likely the source of his pain.  I anticipate discharge with outpatient follow-up with his urologist at [facility].   [CF]  [id number] No evidence of any acute intra-abdominal process.  Patient is calm and cooperative and comfortable at this time.  I updated him with the good news that there is no  evidence of an emergent medical condition he is comfortable with the plan for discharge and outpatient follow-up with his urologist.  I gave my usual and customary return precautions.   [CF]    Clinical Course User Index [CF] Hinda Kehr, MD     ____________________________________________  FINAL CLINICAL IMPRESSION(S) / ED DIAGNOSES  Final  diagnoses:  Bilateral flank pain  Left foot pain     MEDICATIONS GIVEN DURING THIS VISIT:  Medications  ketorolac (TORADOL) 30 MG/ML injection 15 mg (15 mg Intravenous Given 07/05/19 0028)  HYDROmorphone (DILAUDID) injection 1 mg (1 mg Intravenous Given 07/05/19 0028)  sodium chloride 0.9 % bolus 1,000 mL (0 mLs Intravenous Stopped 07/05/19 0134)  lidocaine (XYLOCAINE) 106 mg in sodium chloride 0.9 % 100 mL IVPB (0 mg/kg  70.8 kg Intravenous Stopped 07/05/19 0232)     ED Discharge Orders    None      *Please note:  KAW OSCARSON was evaluated in Emergency Department on 07/05/2019 for the symptoms described in the history of present illness. He was evaluated in the context of the global COVID-19 pandemic, which necessitated consideration that the patient might be at risk for infection with the SARS-CoV-2 virus that causes COVID-19. Institutional protocols and algorithms that pertain to the evaluation of patients at risk for COVID-19 are in a state of rapid change based on information released by regulatory bodies including the CDC and federal and state organizations. These policies and algorithms were followed during the patient's care in the ED.  Some ED evaluations and interventions may be delayed as a result of limited staffing during the pandemic.*  Note:  This document was prepared using Dragon voice recognition software and may include unintentional dictation errors.   Hinda Kehr, MD 07/05/19 [phone]

## 2019-07-04 NOTE — ED Triage Notes (Signed)
Patient reports he has an area on side of left foot that has been there for years (never been seen by MD) and it now hurts especially when he walks, started this am.  Patient also reports bilateral side pain off/on since September.  Reports seen by [facility] for a "spot" on his bladder and prostate.  Also reports had some stones "blasted".  Patient also reports feeling weak.

## 2019-07-05 ENCOUNTER — Emergency Department: Payer: BC Managed Care – PPO

## 2019-07-05 MED ORDER — SODIUM CHLORIDE 0.9 % IV SOLN
1.5000 mg/kg | Freq: Once | INTRAVENOUS | Status: AC
  Administered 2019-07-05: 02:00:00 106 mg via INTRAVENOUS
  Filled 2019-07-05: qty 5.3

## 2019-07-05 MED ORDER — HYDROMORPHONE HCL 1 MG/ML IJ SOLN
1.0000 mg | INTRAMUSCULAR | Status: AC
  Administered 2019-07-05: 1 mg via INTRAVENOUS
  Filled 2019-07-05: qty 1

## 2019-07-05 MED ORDER — SODIUM CHLORIDE 0.9 % IV BOLUS
1000.0000 mL | Freq: Once | INTRAVENOUS | Status: AC
  Administered 2019-07-05: 1000 mL via INTRAVENOUS

## 2019-07-05 MED ORDER — KETOROLAC TROMETHAMINE 30 MG/ML IJ SOLN
15.0000 mg | Freq: Once | INTRAMUSCULAR | Status: AC
  Administered 2019-07-05: 15 mg via INTRAVENOUS
  Filled 2019-07-05: qty 1

## 2019-07-05 NOTE — ED Notes (Signed)
Waiting on lidocaine from pharmacy.

## 2019-07-05 NOTE — ED Notes (Signed)
Pt continues to rate pain 10/10, but appears in no acute distress, texting on phone.

## 2019-07-05 NOTE — Discharge Instructions (Addendum)
Your workup in the Emergency Department today was reassuring.  We did not find any specific abnormalities.  We recommend you drink plenty of fluids, take your regular medications and/or any new ones prescribed today, and follow up with the doctor(s) listed in these documents as recommended.  Return to the Emergency Department if you develop new or worsening symptoms that concern you.  

## 2019-09-06 ENCOUNTER — Emergency Department: Payer: BC Managed Care – PPO

## 2019-09-06 ENCOUNTER — Encounter: Payer: Self-pay | Admitting: Intensive Care

## 2019-09-06 ENCOUNTER — Inpatient Hospital Stay
Admission: EM | Admit: 2019-09-06 | Discharge: 2019-09-10 | DRG: 392 | Disposition: A | Discharge status: Home / Self Care | Payer: BC Managed Care – PPO | Attending: Internal Medicine | Admitting: Internal Medicine

## 2019-09-06 ENCOUNTER — Other Ambulatory Visit: Payer: Self-pay

## 2019-09-06 DIAGNOSIS — Z833 Family history of diabetes mellitus: Secondary | ICD-10-CM

## 2019-09-06 DIAGNOSIS — A09 Infectious gastroenteritis and colitis, unspecified: Secondary | ICD-10-CM | POA: Diagnosis not present

## 2019-09-06 DIAGNOSIS — J45909 Unspecified asthma, uncomplicated: Secondary | ICD-10-CM | POA: Diagnosis present

## 2019-09-06 DIAGNOSIS — E119 Type 2 diabetes mellitus without complications: Secondary | ICD-10-CM | POA: Diagnosis present

## 2019-09-06 DIAGNOSIS — R251 Tremor, unspecified: Secondary | ICD-10-CM

## 2019-09-06 DIAGNOSIS — Z7984 Long term (current) use of oral hypoglycemic drugs: Secondary | ICD-10-CM

## 2019-09-06 DIAGNOSIS — Z79899 Other long term (current) drug therapy: Secondary | ICD-10-CM

## 2019-09-06 DIAGNOSIS — R1030 Lower abdominal pain, unspecified: Secondary | ICD-10-CM

## 2019-09-06 DIAGNOSIS — C679 Malignant neoplasm of bladder, unspecified: Secondary | ICD-10-CM | POA: Diagnosis present

## 2019-09-06 DIAGNOSIS — K1230 Oral mucositis (ulcerative), unspecified: Secondary | ICD-10-CM | POA: Diagnosis present

## 2019-09-06 DIAGNOSIS — K529 Noninfective gastroenteritis and colitis, unspecified: Secondary | ICD-10-CM | POA: Diagnosis present

## 2019-09-06 DIAGNOSIS — Z20822 Contact with and (suspected) exposure to covid-19: Secondary | ICD-10-CM | POA: Diagnosis present

## 2019-09-06 DIAGNOSIS — E785 Hyperlipidemia, unspecified: Secondary | ICD-10-CM | POA: Diagnosis present

## 2019-09-06 DIAGNOSIS — F329 Major depressive disorder, single episode, unspecified: Secondary | ICD-10-CM | POA: Diagnosis present

## 2019-09-06 DIAGNOSIS — R143 Flatulence: Secondary | ICD-10-CM | POA: Diagnosis not present

## 2019-09-06 DIAGNOSIS — Z87891 Personal history of nicotine dependence: Secondary | ICD-10-CM

## 2019-09-06 DIAGNOSIS — Z791 Long term (current) use of non-steroidal anti-inflammatories (NSAID): Secondary | ICD-10-CM

## 2019-09-06 HISTORY — DX: Malignant neoplasm of bladder, unspecified: C67.9

## 2019-09-06 HISTORY — DX: Type 2 diabetes mellitus without complications: E11.9

## 2019-09-06 LAB — CBC
HCT: 41.5 % (ref 39.0–52.0)
Hemoglobin: 14 g/dL (ref 13.0–17.0)
MCH: 30.8 pg (ref 26.0–34.0)
MCHC: 33.7 g/dL (ref 30.0–36.0)
MCV: 91.4 fL (ref 80.0–100.0)
Platelets: 428 10*3/uL — ABNORMAL HIGH (ref 150–400)
RBC: 4.54 MIL/uL (ref 4.22–5.81)
RDW: 13.2 % (ref 11.5–15.5)
WBC: 19.9 10*3/uL — ABNORMAL HIGH (ref 4.0–10.5)
nRBC: 0 % (ref 0.0–0.2)

## 2019-09-06 LAB — COMPREHENSIVE METABOLIC PANEL
ALT: 18 U/L (ref 0–44)
AST: 20 U/L (ref 15–41)
Albumin: 4.3 g/dL (ref 3.5–5.0)
Alkaline Phosphatase: 56 U/L (ref 38–126)
Anion gap: 13 (ref 5–15)
BUN: 9 mg/dL (ref 6–20)
CO2: 21 mmol/L — ABNORMAL LOW (ref 22–32)
Calcium: 9.3 mg/dL (ref 8.9–10.3)
Chloride: 105 mmol/L (ref 98–111)
Creatinine, Ser: 0.85 mg/dL (ref 0.61–1.24)
GFR calc Af Amer: >60 mL/min (ref >60)
GFR calc non Af Amer: >60 mL/min (ref >60)
Glucose, Bld: 95 mg/dL (ref 70–99)
Potassium: 3.5 mmol/L (ref 3.5–5.1)
Sodium: 139 mmol/L (ref 135–145)
Total Bilirubin: 0.7 mg/dL (ref 0.3–1.2)
Total Protein: 7.5 g/dL (ref 6.5–8.1)

## 2019-09-06 LAB — URINALYSIS, COMPLETE (UACMP) WITH MICROSCOPIC
Bacteria, UA: NONE SEEN
Bilirubin Urine: NEGATIVE
Glucose, UA: NEGATIVE mg/dL
Hgb urine dipstick: NEGATIVE
Ketones, ur: NEGATIVE mg/dL
Leukocytes,Ua: NEGATIVE
Nitrite: NEGATIVE
Protein, ur: NEGATIVE mg/dL
Specific Gravity, Urine: 1.006 (ref 1.005–1.030)
Squamous Epithelial / HPF: NONE SEEN (ref 0–5)
pH: 6 (ref 5.0–8.0)

## 2019-09-06 LAB — LACTIC ACID, PLASMA: Lactic Acid, Venous: 0.9 mmol/L (ref 0.5–1.9)

## 2019-09-06 LAB — LIPASE, BLOOD: Lipase: 22 U/L (ref 11–51)

## 2019-09-06 LAB — SEDIMENTATION RATE: Sed Rate: 8 mm/hr (ref 0–15)

## 2019-09-06 MED ORDER — INSULIN ASPART 100 UNIT/ML ~~LOC~~ SOLN
0.0000 [IU] | Freq: Three times a day (TID) | SUBCUTANEOUS | Status: DC
  Administered 2019-09-07: 3 [IU] via SUBCUTANEOUS
  Administered 2019-09-07: 17:00:00 2 [IU] via SUBCUTANEOUS
  Administered 2019-09-08: 3 [IU] via SUBCUTANEOUS
  Administered 2019-09-08: 08:00:00 2 [IU] via SUBCUTANEOUS
  Filled 2019-09-06 (×4): qty 1

## 2019-09-06 MED ORDER — PANTOPRAZOLE SODIUM 40 MG PO TBEC
40.0000 mg | DELAYED_RELEASE_TABLET | Freq: Every day | ORAL | Status: DC
  Administered 2019-09-07 – 2019-09-10 (×4): 40 mg via ORAL
  Filled 2019-09-06 (×4): qty 1

## 2019-09-06 MED ORDER — IOHEXOL 300 MG/ML  SOLN
100.0000 mL | Freq: Once | INTRAMUSCULAR | Status: AC | PRN
  Administered 2019-09-06: 100 mL via INTRAVENOUS

## 2019-09-06 MED ORDER — MORPHINE SULFATE (PF) 4 MG/ML IV SOLN
4.0000 mg | Freq: Once | INTRAVENOUS | Status: AC
  Administered 2019-09-06: 4 mg via INTRAVENOUS
  Filled 2019-09-06: qty 1

## 2019-09-06 MED ORDER — OXYBUTYNIN CHLORIDE ER 10 MG PO TB24
10.0000 mg | ORAL_TABLET | Freq: Every day | ORAL | Status: DC
  Administered 2019-09-07 – 2019-09-10 (×4): 10 mg via ORAL
  Filled 2019-09-06 (×5): qty 1

## 2019-09-06 MED ORDER — BUSPIRONE HCL 5 MG PO TABS
7.5000 mg | ORAL_TABLET | Freq: Two times a day (BID) | ORAL | Status: DC
  Administered 2019-09-06 – 2019-09-10 (×8): 7.5 mg via ORAL
  Filled 2019-09-06 (×10): qty 1.5

## 2019-09-06 MED ORDER — SIMVASTATIN 10 MG PO TABS
20.0000 mg | ORAL_TABLET | Freq: Every day | ORAL | Status: DC
  Administered 2019-09-07 – 2019-09-09 (×3): 20 mg via ORAL
  Filled 2019-09-06 (×5): qty 2

## 2019-09-06 MED ORDER — ENOXAPARIN SODIUM 40 MG/0.4ML ~~LOC~~ SOLN
40.0000 mg | SUBCUTANEOUS | Status: DC
  Administered 2019-09-06 – 2019-09-10 (×4): 40 mg via SUBCUTANEOUS
  Filled 2019-09-06 (×4): qty 0.4

## 2019-09-06 MED ORDER — MORPHINE SULFATE (PF) 2 MG/ML IV SOLN
2.0000 mg | INTRAVENOUS | Status: DC | PRN
  Administered 2019-09-07 – 2019-09-09 (×10): 2 mg via INTRAVENOUS
  Filled 2019-09-06 (×10): qty 1

## 2019-09-06 MED ORDER — METRONIDAZOLE IN NACL 5-0.79 MG/ML-% IV SOLN
500.0000 mg | Freq: Three times a day (TID) | INTRAVENOUS | Status: DC
  Administered 2019-09-07 – 2019-09-10 (×10): 500 mg via INTRAVENOUS
  Filled 2019-09-06 (×12): qty 100

## 2019-09-06 MED ORDER — OXYCODONE HCL 5 MG PO TABS
5.0000 mg | ORAL_TABLET | Freq: Four times a day (QID) | ORAL | Status: DC | PRN
  Administered 2019-09-06 – 2019-09-09 (×9): 5 mg via ORAL
  Filled 2019-09-06 (×9): qty 1

## 2019-09-06 MED ORDER — ONDANSETRON HCL 4 MG/2ML IJ SOLN
4.0000 mg | Freq: Once | INTRAMUSCULAR | Status: AC
  Administered 2019-09-06: 19:00:00 4 mg via INTRAVENOUS
  Filled 2019-09-06: qty 2

## 2019-09-06 MED ORDER — ACETAMINOPHEN 650 MG RE SUPP: 650.0000 mg | Freq: Four times a day (QID) | RECTAL | Status: DC | PRN

## 2019-09-06 MED ORDER — PROMETHAZINE HCL 25 MG/ML IJ SOLN
12.5000 mg | Freq: Four times a day (QID) | INTRAMUSCULAR | Status: DC | PRN
  Administered 2019-09-07: 12.5 mg via INTRAVENOUS
  Filled 2019-09-06: qty 1

## 2019-09-06 MED ORDER — SODIUM CHLORIDE 0.9 % IV BOLUS
1000.0000 mL | Freq: Once | INTRAVENOUS | Status: AC
  Administered 2019-09-06: 19:00:00 1000 mL via INTRAVENOUS

## 2019-09-06 MED ORDER — ACETAMINOPHEN 325 MG PO TABS: 650.0000 mg | ORAL_TABLET | Freq: Four times a day (QID) | ORAL | Status: DC | PRN

## 2019-09-06 MED ORDER — INSULIN ASPART 100 UNIT/ML ~~LOC~~ SOLN
0.0000 [IU] | Freq: Every day | SUBCUTANEOUS | Status: DC
  Administered 2019-09-07 – 2019-09-09 (×3): 2 [IU] via SUBCUTANEOUS
  Filled 2019-09-06 (×3): qty 1

## 2019-09-06 MED ORDER — CIPROFLOXACIN IN D5W 400 MG/200ML IV SOLN
400.0000 mg | Freq: Two times a day (BID) | INTRAVENOUS | Status: DC
  Administered 2019-09-07 – 2019-09-10 (×7): 400 mg via INTRAVENOUS
  Filled 2019-09-06 (×8): qty 200

## 2019-09-06 MED ORDER — POTASSIUM CHLORIDE IN NACL 20-0.9 MEQ/L-% IV SOLN
INTRAVENOUS | Status: AC
  Filled 2019-09-06 (×2): qty 1000

## 2019-09-06 MED ORDER — ALBUTEROL SULFATE HFA 108 (90 BASE) MCG/ACT IN AERS
2.0000 | INHALATION_SPRAY | Freq: Four times a day (QID) | RESPIRATORY_TRACT | Status: DC | PRN
  Filled 2019-09-06: qty 6.7

## 2019-09-06 MED ORDER — OXYCODONE HCL 5 MG PO TABS: 5.0000 mg | ORAL_TABLET | ORAL | Status: DC | PRN

## 2019-09-06 MED ORDER — CIPROFLOXACIN IN D5W 400 MG/200ML IV SOLN
400.0000 mg | Freq: Once | INTRAVENOUS | Status: AC
  Administered 2019-09-06: 400 mg via INTRAVENOUS
  Filled 2019-09-06: qty 200

## 2019-09-06 MED ORDER — METRONIDAZOLE IN NACL 5-0.79 MG/ML-% IV SOLN
500.0000 mg | Freq: Once | INTRAVENOUS | Status: AC
  Administered 2019-09-06: 500 mg via INTRAVENOUS
  Filled 2019-09-06: qty 100

## 2019-09-06 NOTE — ED Notes (Signed)
Report to Almyra Free, Therapist, sports. Transportation requested.

## 2019-09-06 NOTE — ED Notes (Addendum)
Pt states he's had vision "floaters and spots," lip numbness and tingling, and has severe right sided abdominal pain. Pt denies alcohol, drug use, and smoking. Pt states he's been eating and drinking ok, reports pain and burning with urination but says that is not new. Pt c/o uncontrollable shaking as well. Pt states he started on new medications by primary MD and feels s/s may be related to the meds.

## 2019-09-06 NOTE — ED Provider Notes (Signed)
[facility] Emergency Department Provider Note  ____________________________________________   First MD Initiated Contact with Patient 09/06/19 1829     (approximate)  I have reviewed the triage vital signs and the nursing notes.  History  Chief Complaint Abdominal Pain and Shaking    HPI Charles Beasley is a 44 y.o. Charles with history nephrolithiasis, DM, diverticulosis, recently diagnosed with bladder cancer last week, who presents to the emergency department for lower abdominal pain and shakiness.  Patient states he has had ongoing lower abdominal discomfort from some time, but over the last several days his pain is markedly increased.  Pain is currently severe, located to the lower abdomen, right greater than left.  Constant.  No radiation.  No alleviating or aggravating components.  No known fevers.  Last BM yesterday was normal.  Nausea, but no vomiting.  He does have painful urination and discomfort with urination, but he states this has been ongoing for some time as well.  Patient recently saw his PMD for his lower abdominal pain several days ago and was started on antibiotics for presumed diverticulitis.  Around the same time he states he began having whole body shakiness.  Denies any previous history of allergic reactions of this nature.   Past Medical Hx Past Medical History:  Diagnosis Date  . Asthma   . Bladder cancer ([place])   . Diabetes mellitus without complication ([place])   . Diverticulosis   . History of kidney stones   . IBS (irritable bowel syndrome)     Problem List There are no problems to display for this patient.   Past Surgical Hx History reviewed. No pertinent surgical history.  Medications Prior to Admission medications   Medication Sig Start Date End Date Taking? Authorizing Provider  albuterol (PROVENTIL HFA;VENTOLIN HFA) 108 (90 BASE) MCG/ACT inhaler Inhale 2 puffs into the lungs every 6 (six) hours as needed. Reported on  11/08/2015    [provider]  docusate sodium (COLACE) 100 MG capsule Take 1 tablet once or twice daily as needed for constipation while taking narcotic pain medicine 01/28/19   Hinda Kehr, MD  fexofenadine (ALLEGRA) 180 MG tablet Take 180 mg by mouth daily.    [provider]  ketorolac (TORADOL) 10 MG tablet Take 1 tablet (10 mg total) by mouth every 8 (eight) hours as needed for moderate pain. 01/30/17   Lisa Roca, MD  meloxicam (MOBIC) 7.5 MG tablet Take 7.5 mg by mouth daily.     [provider]  metFORMIN (GLUCOPHAGE) 500 MG tablet Take 1 tablet (500 mg total) by mouth 2 (two) times daily with a meal. 10/15/15 02/02/17  Schaevitz, Randall An, MD  omeprazole (PRILOSEC) 40 MG capsule Take 1 capsule by mouth daily. 03/23/15 02/02/17  [provider]  ondansetron (ZOFRAN ODT) 4 MG disintegrating tablet Allow 1-2 tablets to dissolve in your mouth every 8 hours as needed for nausea/vomiting 01/28/19   Hinda Kehr, MD  ondansetron (ZOFRAN) 4 MG tablet Take 1 tablet (4 mg total) by mouth every 8 (eight) hours as needed for nausea or vomiting. 01/30/17   Lisa Roca, MD  oxyCODONE-acetaminophen (PERCOCET) 5-325 MG tablet Take 2 tablets by mouth every 6 (six) hours as needed for severe pain. 01/28/19   Hinda Kehr, MD  tamsulosin (FLOMAX) 0.4 MG CAPS capsule Take 1 tablet by mouth daily until you pass the kidney stone or no longer have symptoms 01/28/19   Hinda Kehr, MD    Allergies Montelukast and Sertraline  Family Hx Family History  Problem Relation Age of Onset  . Prostate cancer Neg Hx   . Bladder Cancer Neg Hx   . Kidney cancer Neg Hx     Social Hx Social History   Tobacco Use  . Smoking status: Former Smoker    Types: Cigarettes  . Smokeless tobacco: Never Used  Substance Use Topics  . Alcohol use: No    Alcohol/week: 0.0 standard drinks  . Drug use: No     Review of Systems  Constitutional: Negative for fever, chills. Eyes:  Negative for visual changes. ENT: Negative for sore throat. Cardiovascular: Negative for chest pain. Respiratory: Negative for shortness of breath. Gastrointestinal: Positive for abdominal pain. Genitourinary: Negative for dysuria. Musculoskeletal: Negative for leg swelling. Skin: Negative for rash. Neurological: Negative for headaches.  Positive for shaking.   Physical Exam  Vital Signs: ED Triage Vitals  Enc Vitals Group     BP 09/06/19 1754 138/86     Pulse Rate 09/06/19 1754 98     Resp 09/06/19 1754 18     Temp 09/06/19 1754 98.5 F (36.9 C)     Temp Source 09/06/19 1754 Oral     SpO2 09/06/19 1754 100 %     Weight 09/06/19 1804 154 lb (69.9 kg)     Height 09/06/19 1804 5\' 9"  (1.753 m)     Head Circumference --      Peak Flow --      Pain Score 09/06/19 1803 9     Pain Loc --      Pain Edu? --      Excl. in [place]? --     Constitutional: Alert and oriented.  Head: Normocephalic. Atraumatic. Eyes: Conjunctivae clear. Sclera anicteric. Nose: No congestion. No rhinorrhea. Mouth/Throat: Wearing mask.  Neck: No stridor.   Cardiovascular: Normal rate, regular rhythm. Extremities well perfused. Respiratory: Normal respiratory effort.  Lungs CTAB. Gastrointestinal: Soft.  Tender to palpation in the lower abdomen, right greater than left.  Voluntary guarding. Musculoskeletal: No lower extremity edema. No deformities. Neurologic:  Normal speech and language. No gross focal neurologic deficits are appreciated.  Whole body shaking versus rigors type presentation. Improves with pain control and seems to lessen mildly with distraction. Skin: Skin is warm, dry and intact. No rash noted. Psychiatric: Mood and affect are appropriate for situation.  EKG  Personally reviewed.   Rate: 107 Rhythm: sinus Axis: normal Intervals: WNL, borderline QT No acute ischemic changes No STEMI    Radiology  CT: IMPRESSION:  1. Some thickening and adjacent fat stranding about the  descending  colon, suggestive of nonspecific infectious, inflammatory, or  ischemic colitis. Occasional descending and sigmoid diverticula  which do not appear particularly involved to specifically suggest  diverticulitis.  2. No evidence of bladder mass, lymphadenopathy, or metastatic  disease in the abdomen or pelvis.  3. Nonobstructive left nephrolithiasis. No hydronephrosis.  4. Mild prostatomegaly.  5. Aortic Atherosclerosis (ICD10-I70.0).   CT: IMPRESSION:  No acute intracranial pathology.    Procedures  Procedure(s) performed (including critical care):  Procedures   Initial Impression / Assessment and Plan / ED Course  44 y.o. Charles recently diagnosed with bladder cancer who presents to the ED for lower abdominal pain, rigors.  Ddx: appendicitis, diverticulitis, complications from bladder cancer, pyelonephritis.  With regards to his rigors vs tremors, consider medication side effect (though lower suspicion for this as it improves w/ pain and sometimes w/ distraction), pain related, less likely but consider intracranial abnormality in the setting  of recently diagnosed cancer.  Will evaluate with labs, imaging.  Imaging concern for colitis. Leukocytosis to 19. Patient still reporting pain despite IV medications, will re-dose and reassess. Nausea improving and tolerating PO. Will plan to admit for IV antibiotics, pain control, further management, patient agreeable. Discussed w/ hospitalist for admission.   Final Clinical Impression(s) / ED Diagnosis  Final diagnoses:  Lower abdominal pain  Shaking  Colitis       Note:  This document was prepared using Dragon voice recognition software and may include unintentional dictation errors.   Lilia Pro., MD 09/06/19 2214

## 2019-09-06 NOTE — ED Triage Notes (Signed)
Patient c/o RUQ and RLQ abd pain. Diagnosed with bladder cancer recently and suppose to find out what stage it is Friday. Patient is shaking continuously in triage that he reports started X2 days ago. PCP put patient on cipro X2 days ago from possible diverticulosis.

## 2019-09-06 NOTE — H&P (Signed)
History and Physical    Charles Beasley [id number] DOB: [birth year] DOA: 09/06/2019  PCP: [facility]  Patient coming from: Home  I have personally briefly reviewed patient's old medical records in [place]  Chief Complaint: Lower abdominal pain, shakes  HPI: Charles Beasley is a 44 y.o. male with medical history significant for type 2 diabetes, asthma, hyperlipidemia, depression, and recently diagnosed bladder cancer who presents to the ED for evaluation of worsening lower abdominal pain.  Patient was initially seen by his PCP on 09/02/2019 for right-sided lower abdominal pain.  He was started on treatment for presumed right-sided diverticulitis with oral ciprofloxacin and Flagyl.  He was seen in follow-up on 09/05/2019 at which time his symptoms were documented to be improving.  Patient returns to the ED today for further evaluation of persistent right-sided and bilateral lower abdominal pain.  He says the pain also radiates to his back.  He has been feeling generally weak, fatigued, with occasional cold chills, diaphoresis, and rigors.  He has had nausea without emesis.  He reports one episode of watery stool without any bowel movements today.  He reports frequent urination without dysuria.  He also has been having intermittent headaches and occasionally seeing floaters in his vision.  He is feeling dehydrated.  ED Course:  Initial vitals showed BP 138/86, pulse 98, RR 18, temp 98.5 Fahrenheit, SPO2 100% on room air.  Labs are notable for WBC 19.9, hemoglobin 14.0, platelets 428,000, sodium 139, potassium 3.5, bicarb 21, BUN 9, creatinine 0.85, LFTs within normal limits, lipase 22, lactic acid 0.9.  Urinalysis showed negative nitrites, negative leukocytes, 0-5 RBC/hpf, 0-5 WBCs/hpf, no bacteria on microscopy.  Urine culture was obtained and pending.  SARS-CoV-2 PCR test was obtained and pending.  CT abdomen/pelvis with contrast showed changes suggestive of descending  colitis.  Descending and sigmoid diverticula were seen without evidence of diverticulitis.  There was no evidence of bladder mass, lymphadenopathy, or metastatic disease in the abdomen or pelvis.  Nonobstructive left nephrolithiasis without hydronephrosis was seen.  Mild prostatomegaly also noted.  CT head without contrast was negative for acute intracranial pathology.  Patient was given 1 L normal saline, IV ciprofloxacin and Flagyl, and IV morphine 4 mg x 2.  The hospitalist service was consulted to admit for further evaluation and management.  Review of Systems: All systems reviewed and are negative except as documented in history of present illness above.   Past Medical History:  Diagnosis Date  . Asthma   . Bladder cancer ([place])   . Diabetes mellitus without complication ([place])   . Diverticulosis   . History of kidney stones   . IBS (irritable bowel syndrome)     History reviewed. No pertinent surgical history.  Social History:  reports that he has quit smoking. His smoking use included cigarettes. He has never used smokeless tobacco. He reports that he does not drink alcohol or use drugs.  Allergies  Allergen Reactions  . Montelukast Photosensitivity  . Sertraline Other (See Comments)    Feels out of it, zombie    Family History  Problem Relation Age of Onset  . Breast cancer Mother   . Hypertension Father   . Prostate cancer Neg Hx   . Bladder Cancer Neg Hx   . Kidney cancer Neg Hx      Prior to Admission medications   Medication Sig Start Date End Date Taking? Authorizing Provider  busPIRone (BUSPAR) 7.5 MG tablet Take 7.5 mg by mouth 2 (  two) times daily. 08/29/19  Yes [provider]  ciprofloxacin (CIPRO) 500 MG tablet Take 500 mg by mouth 3 (three) times daily. For 10 days 09/02/19 09/12/19 Yes [provider]  glipiZIDE (GLUCOTROL XL) 5 MG 24 hr tablet Take 5 mg by mouth daily. 09/02/19  Yes [provider]  meloxicam (MOBIC) 7.5 MG tablet  Take 7.5 mg by mouth daily.    Yes [provider]  metFORMIN (GLUCOPHAGE) 1000 MG tablet Take 1,000 mg by mouth 2 (two) times daily. 08/19/19  Yes [provider]  metroNIDAZOLE (FLAGYL) 500 MG tablet Take 500 mg by mouth 2 (two) times daily. For 7 days 09/02/19  Yes [provider]  omeprazole (PRILOSEC) 20 MG capsule Take 20 mg by mouth 2 (two) times daily. 08/25/19  Yes [provider]  oxybutynin (DITROPAN-XL) 10 MG 24 hr tablet Take 10 mg by mouth daily. 08/25/19  Yes [provider]  promethazine (PHENERGAN) 25 MG tablet Take 25 mg by mouth every 6 (six) hours as needed for nausea. 09/02/19 09/09/19 Yes [provider]  simvastatin (ZOCOR) 20 MG tablet Take 20 mg by mouth at bedtime. 08/16/19  Yes [provider]  albuterol (PROVENTIL HFA;VENTOLIN HFA) 108 (90 BASE) MCG/ACT inhaler Inhale 2 puffs into the lungs every 6 (six) hours as needed. Reported on 11/08/2015    [provider]  fexofenadine (ALLEGRA) 180 MG tablet Take 180 mg by mouth daily as needed for allergies.     [provider]    Physical Exam: Vitals:   09/06/19 2045 09/06/19 2200 09/06/19 2208 09/06/19 2235  BP:    (!) 116/95  Pulse: (!) 105 98  98  Resp: 17 17  18   Temp:   98.2 F (36.8 C) 98.5 F (36.9 C)  TempSrc:   Oral Oral  SpO2: 96% 96%  97%  Weight:      Height:       Constitutional: Resting supine in bed, NAD, calm, appears tired but comfortable Eyes: PERRL, lids and conjunctivae normal ENMT: Mucous membranes are dry. Posterior pharynx clear of any exudate or lesions.Normal dentition.  Neck: normal, supple, no masses. Respiratory: Bibasilar inspiratory crackles. Normal respiratory effort. No accessory muscle use.  Cardiovascular: Regular rate and rhythm, no murmurs / rubs / gallops. No extremity edema. 2+ pedal pulses. Abdomen: Right upper quadrant tenderness, no masses palpated. No hepatosplenomegaly. Bowel sounds positive.    Musculoskeletal: no clubbing / cyanosis. No joint deformity upper and lower extremities. Good ROM, no contractures. Normal muscle tone.  Skin: no rashes, lesions, ulcers. No induration Neurologic: CN 2-12 grossly intact. Sensation intact, Strength 5/5 in all 4.  Psychiatric: Normal judgment and insight. Alert and oriented x 3. Normal mood.     Labs on Admission: I have personally reviewed following labs and imaging studies  CBC: Recent Labs  Lab 09/06/19 1805  WBC 19.9*  HGB 14.0  HCT 41.5  MCV 91.4  PLT [id number]*   Basic Metabolic Panel: Recent Labs  Lab 09/06/19 1805  NA 139  K 3.5  CL 105  CO2 21*  GLUCOSE 95  BUN 9  CREATININE 0.85  CALCIUM 9.3   GFR: Estimated Creatinine Clearance: 108.5 mL/min (by C-G formula based on SCr of 0.85 mg/dL). Liver Function Tests: Recent Labs  Lab 09/06/19 1805  AST 20  ALT 18  ALKPHOS 56  BILITOT 0.7  PROT 7.5  ALBUMIN 4.3   Recent Labs  Lab 09/06/19 1805  LIPASE 22   No results for  input(s): AMMONIA in the last 168 hours. Coagulation Profile: No results for input(s): INR, PROTIME in the last 168 hours. Cardiac Enzymes: No results for input(s): CKTOTAL, CKMB, CKMBINDEX, TROPONINI in the last 168 hours. BNP (last 3 results) No results for input(s): PROBNP in the last 8760 hours. HbA1C: No results for input(s): HGBA1C in the last 72 hours. CBG: No results for input(s): GLUCAP in the last 168 hours. Lipid Profile: No results for input(s): CHOL, HDL, LDLCALC, TRIG, CHOLHDL, LDLDIRECT in the last 72 hours. Thyroid Function Tests: No results for input(s): TSH, T4TOTAL, FREET4, T3FREE, THYROIDAB in the last 72 hours. Anemia Panel: No results for input(s): VITAMINB12, FOLATE, FERRITIN, TIBC, IRON, RETICCTPCT in the last 72 hours. Urine analysis:    Component Value Date/Time   COLORURINE YELLOW (A) 09/06/2019 1855   APPEARANCEUR CLEAR (A) 09/06/2019 1855   APPEARANCEUR Clear 02/02/2017 1320   LABSPEC 1.006 09/06/2019  1855   PHURINE 6.0 09/06/2019 1855   GLUCOSEU NEGATIVE 09/06/2019 1855   HGBUR NEGATIVE 09/06/2019 1855   BILIRUBINUR NEGATIVE 09/06/2019 1855   BILIRUBINUR Negative 02/02/2017 1320   KETONESUR NEGATIVE 09/06/2019 1855   PROTEINUR NEGATIVE 09/06/2019 1855   NITRITE NEGATIVE 09/06/2019 1855   LEUKOCYTESUR NEGATIVE 09/06/2019 1855    Radiological Exams on Admission: CT Head Wo Contrast  Result Date: 09/06/2019 CLINICAL DATA:  Shakiness, history of bladder cancer EXAM: CT HEAD WITHOUT CONTRAST TECHNIQUE: Contiguous axial images were obtained from the base of the skull through the vertex without intravenous contrast. COMPARISON:  None. FINDINGS: Brain: No evidence of acute infarction, hemorrhage, hydrocephalus, extra-axial collection or mass lesion/mass effect. Vascular: No hyperdense vessel or unexpected calcification. Skull: Normal. Negative for fracture or focal lesion. Sinuses/Orbits: No acute finding. Other: None. IMPRESSION: No acute intracranial pathology. Electronically Signed   By: Eddie Candle M.D.   On: 09/06/2019 20:02   CT Abdomen Pelvis W Contrast  Result Date: 09/06/2019 CLINICAL DATA:  Lower abdominal pain, leukocytosis, history of bladder cancer EXAM: CT ABDOMEN AND PELVIS WITH CONTRAST TECHNIQUE: Multidetector CT imaging of the abdomen and pelvis was performed using the standard protocol following bolus administration of intravenous contrast. CONTRAST:  145mL OMNIPAQUE IOHEXOL 300 MG/ML  SOLN COMPARISON:  07/05/2019 FINDINGS: Lower chest: No acute abnormality. Hepatobiliary: No solid liver abnormality is seen. Subcentimeter cyst of the anterior right lobe of the liver (series 2, image 11). No gallstones, gallbladder wall thickening, or biliary dilatation. Pancreas: Unremarkable. No pancreatic ductal dilatation or surrounding inflammatory changes. Spleen: Normal in size without significant abnormality. Adrenals/Urinary Tract: Adrenal glands are unremarkable. Tiny nonobstructive calculus  of the inferior pole of the left kidney. Bladder is unremarkable. Stomach/Bowel: Stomach is within normal limits. Appendix appears normal. No evidence of bowel wall thickening, distention, or inflammatory changes. Occasional descending and sigmoid diverticula. There is decompression and some thickening and adjacent fat stranding about the descending colon (series 5, image 43). Vascular/Lymphatic: Aortic atherosclerosis. No enlarged abdominal or pelvic lymph nodes. Reproductive: Mild prostatomegaly. Other: No abdominal wall hernia or abnormality. No abdominopelvic ascites. Musculoskeletal: No acute or significant osseous findings. IMPRESSION: 1. Some thickening and adjacent fat stranding about the descending colon, suggestive of nonspecific infectious, inflammatory, or ischemic colitis. Occasional descending and sigmoid diverticula which do not appear particularly involved to specifically suggest diverticulitis. 2. No evidence of bladder mass, lymphadenopathy, or metastatic disease in the abdomen or pelvis. 3. Nonobstructive left nephrolithiasis.  No hydronephrosis. 4. Mild prostatomegaly. 5. Aortic Atherosclerosis (ICD10-I70.0). Electronically Signed   By: Eddie Candle M.D.   On: 09/06/2019 20:01  EKG: Independently reviewed. Sinus tachycardia, LVH, QTC 491, PVC.  When compared to prior rate is faster and PVC is new.  Assessment/Plan Principal Problem:   Colitis Active Problems:   Diabetes mellitus without complication ([place])   Asthma   Bladder cancer ([place])  SHAFIN EDGAR is a 44 y.o. male with medical history significant for type 2 diabetes, asthma, hyperlipidemia, depression, and recently diagnosed bladder cancer who is admitted with colitis.  Colitis: Descending colitis seen on CT abdomen/pelvis, continuing empiric antibiotics after outpatient failure.  Differential also could include inflammatory colitis.  Appendix appears normal on CT.  No obvious etiology for his right-sided abdominal  pain. -Continue IV ciprofloxacin and metronidazole -Continue IV fluid resuscitation -Antiemetics as needed  Type 2 diabetes: A1c 10.1 on 08/20/2019.  Holding home Metformin and glipizide, continue sensitive SSI while in hospital.  Asthma: Currently stable, continue as needed albuterol.  Hyperlipidemia: Continue simvastatin.  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  He is scheduled to follow-up with [facility] on 09/12/2019.  Depression: Continue BuSpar.  DVT prophylaxis: Lovenox Code Status: Full code, confirmed with patient Family Communication: Discussed with patient, he has discussed with family Disposition Plan: Likely discharge to home pending clinical progress Consults called: None Admission status: Observation   Zada Finders MD [facility]  If 7PM-7AM, please contact night-coverage [email or web address]  09/06/2019, 11:05 PM

## 2019-09-06 NOTE — ED Notes (Signed)
Admitting MD with patient

## 2019-09-07 DIAGNOSIS — R143 Flatulence: Secondary | ICD-10-CM | POA: Diagnosis present

## 2019-09-07 DIAGNOSIS — E860 Dehydration: Secondary | ICD-10-CM | POA: Diagnosis not present

## 2019-09-07 DIAGNOSIS — Z79899 Other long term (current) drug therapy: Secondary | ICD-10-CM | POA: Diagnosis not present

## 2019-09-07 DIAGNOSIS — E119 Type 2 diabetes mellitus without complications: Secondary | ICD-10-CM | POA: Diagnosis present

## 2019-09-07 DIAGNOSIS — F329 Major depressive disorder, single episode, unspecified: Secondary | ICD-10-CM | POA: Diagnosis present

## 2019-09-07 DIAGNOSIS — K529 Noninfective gastroenteritis and colitis, unspecified: Secondary | ICD-10-CM | POA: Diagnosis not present

## 2019-09-07 DIAGNOSIS — K1230 Oral mucositis (ulcerative), unspecified: Secondary | ICD-10-CM | POA: Diagnosis present

## 2019-09-07 DIAGNOSIS — J45909 Unspecified asthma, uncomplicated: Secondary | ICD-10-CM | POA: Diagnosis present

## 2019-09-07 DIAGNOSIS — C679 Malignant neoplasm of bladder, unspecified: Secondary | ICD-10-CM | POA: Diagnosis present

## 2019-09-07 DIAGNOSIS — Z833 Family history of diabetes mellitus: Secondary | ICD-10-CM | POA: Diagnosis not present

## 2019-09-07 DIAGNOSIS — Z791 Long term (current) use of non-steroidal anti-inflammatories (NSAID): Secondary | ICD-10-CM | POA: Diagnosis not present

## 2019-09-07 DIAGNOSIS — R1030 Lower abdominal pain, unspecified: Secondary | ICD-10-CM | POA: Diagnosis not present

## 2019-09-07 DIAGNOSIS — Z20822 Contact with and (suspected) exposure to covid-19: Secondary | ICD-10-CM | POA: Diagnosis present

## 2019-09-07 DIAGNOSIS — Z87891 Personal history of nicotine dependence: Secondary | ICD-10-CM | POA: Diagnosis not present

## 2019-09-07 DIAGNOSIS — Z7984 Long term (current) use of oral hypoglycemic drugs: Secondary | ICD-10-CM | POA: Diagnosis not present

## 2019-09-07 DIAGNOSIS — A09 Infectious gastroenteritis and colitis, unspecified: Secondary | ICD-10-CM | POA: Diagnosis present

## 2019-09-07 DIAGNOSIS — E785 Hyperlipidemia, unspecified: Secondary | ICD-10-CM | POA: Diagnosis present

## 2019-09-07 LAB — GLUCOSE, CAPILLARY
Glucose-Capillary: 104 mg/dL — ABNORMAL HIGH (ref 70–99)
Glucose-Capillary: 234 mg/dL — ABNORMAL HIGH (ref 70–99)
Glucose-Capillary: 176 mg/dL — ABNORMAL HIGH (ref 70–99)
Glucose-Capillary: 233 mg/dL — ABNORMAL HIGH (ref 70–99)

## 2019-09-07 LAB — BASIC METABOLIC PANEL
Anion gap: 7 (ref 5–15)
BUN: 6 mg/dL (ref 6–20)
CO2: 25 mmol/L (ref 22–32)
Calcium: 8.4 mg/dL — ABNORMAL LOW (ref 8.9–10.3)
Chloride: 109 mmol/L (ref 98–111)
Creatinine, Ser: 0.84 mg/dL (ref 0.61–1.24)
GFR calc Af Amer: >60 mL/min (ref >60)
GFR calc non Af Amer: >60 mL/min (ref >60)
Glucose, Bld: 104 mg/dL — ABNORMAL HIGH (ref 70–99)
Potassium: 3.8 mmol/L (ref 3.5–5.1)
Sodium: 141 mmol/L (ref 135–145)

## 2019-09-07 LAB — CBC
HCT: 36.9 % — ABNORMAL LOW (ref 39.0–52.0)
Hemoglobin: 12.2 g/dL — ABNORMAL LOW (ref 13.0–17.0)
MCH: 30.4 pg (ref 26.0–34.0)
MCHC: 33.1 g/dL (ref 30.0–36.0)
MCV: 92 fL (ref 80.0–100.0)
Platelets: 318 10*3/uL (ref 150–400)
RBC: 4.01 MIL/uL — ABNORMAL LOW (ref 4.22–5.81)
RDW: 13.5 % (ref 11.5–15.5)
WBC: 11.4 10*3/uL — ABNORMAL HIGH (ref 4.0–10.5)
nRBC: 0 % (ref 0.0–0.2)

## 2019-09-07 LAB — SARS CORONAVIRUS 2 (TAT 6-24 HRS): SARS Coronavirus 2: NEGATIVE

## 2019-09-07 LAB — C-REACTIVE PROTEIN: CRP: 0.6 mg/dL (ref <1.0)

## 2019-09-07 LAB — HEMOGLOBIN A1C
Hgb A1c MFr Bld: 9.8 % — ABNORMAL HIGH (ref 4.8–5.6)
Mean Plasma Glucose: 234.56 mg/dL

## 2019-09-07 LAB — HIV ANTIBODY (ROUTINE TESTING W REFLEX): HIV Screen 4th Generation wRfx: NONREACTIVE

## 2019-09-07 MED ORDER — SODIUM CHLORIDE 0.9 % IV SOLN
INTRAVENOUS | Status: DC | PRN
  Administered 2019-09-07 (×2): 30 mL via INTRAVENOUS

## 2019-09-07 NOTE — Progress Notes (Signed)
PROGRESS NOTE    Charles Beasley  [id number] DOB: 13-Oct-1975 DOA: 09/06/2019 PCP: [facility]    Brief Narrative:  HPI: Charles Beasley is a 44 y.o. male with medical history significant for type 2 diabetes, asthma, hyperlipidemia, depression, and recently diagnosed bladder cancer who presents to the ED for evaluation of worsening lower abdominal pain.  Patient was initially seen by his PCP on 09/02/2019 for right-sided lower abdominal pain.  He was started on treatment for presumed right-sided diverticulitis with oral ciprofloxacin and Flagyl.  He was seen in follow-up on 09/05/2019 at which time his symptoms were documented to be improving.  Patient returns to the ED today for further evaluation of persistent right-sided and bilateral lower abdominal pain.  He says the pain also radiates to his back.  He has been feeling generally weak, fatigued, with occasional cold chills, diaphoresis, and rigors.  He has had nausea without emesis.  He reports one episode of watery stool without any bowel movements today.  He reports frequent urination without dysuria.  He also has been having intermittent headaches and occasionally seeing floaters in his vision.  He is feeling dehydrated.  2/7: Patient seen and examined.  Has been on IV ciprofloxacin and IV Flagyl overnight.  Reports symptomatic improvement in abdominal pain.  Tolerating clear liquid diet without issue.   Assessment & Plan:   Principal Problem:   Colitis Active Problems:   Diabetes mellitus without complication (HCC)   Asthma   Bladder cancer ([place])  Colitis: Descending colitis seen on CT abdomen/pelvis,  Failed outpatient p.o. antibiotics  Appendix appears normal on CT.   No obvious etiology for his right-sided abdominal pain. Ischemic colitis and inflammatory colitis still on differential however plan is less likely.  Patient did not endorse any personal or family history of inflammatory bowel disease Ischemic colitis  on differential given poorly controlled type 2 diabetes however also find this less likely Symptoms improving on antibiotic regimen Plan: -Continue IV ciprofloxacin and metronidazole -Continue IV fluid resuscitation -Antiemetics as needed -Advance diet as tolerated  Type 2 diabetes: A1c 10.1 on 08/20/2019.   Holding home Metformin and glipizide,  continue sensitive SSI while in hospital.  Asthma: Currently stable continue as needed albuterol.  Hyperlipidemia: Continue simvastatin.  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   He is scheduled to follow-up with [facility] on 09/12/2019. Unclear whether the development of acute colitis is related to underlying bladder cancer  Depression: Continue BuSpar.   DVT prophylaxis: Lovenox Code Status: Full Family Communication: None today Disposition Plan: Home, anticipate 24 to 48 hours.  Do not anticipate any barriers to discharge.  Awaiting improvement in abdominal pain, tolerance of p.o. intake   Consultants:   None  Procedures:   None  Antimicrobials:   Ciprofloxacin  Metronidazole   Subjective: Seen and examined No acute status changes overall Symptoms improved this morning  Objective: Vitals:   09/06/19 2200 09/06/19 2208 09/06/19 2235 09/07/19 0517  BP:   (!) 116/95 110/68  Pulse: 98  98 80  Resp: 17  18   Temp:  98.2 F (36.8 C) 98.5 F (36.9 C) 97.7 F (36.5 C)  TempSrc:  Oral Oral Oral  SpO2: 96%  97% 97%  Weight:      Height:        Intake/Output Summary (Last 24 hours) at 09/07/2019 1319 Last data filed at 09/07/2019 0943 Gross per 24 hour  Intake 1857.23 ml  Output 1300 ml  Net 557.23 ml   Filed Weights   09/06/19 1804  Weight: 69.9 kg    Examination:  General exam: Appears calm and comfortable  Respiratory system: Clear to auscultation. Respiratory effort normal. Cardiovascular system: S1 & S2 heard, RRR. No JVD, murmurs, rubs,  gallops or clicks. No pedal edema. Gastrointestinal system: Positive bowel sounds, nondistended.  Mild TTP and right lower quadrant and right flank Central nervous system: Alert and oriented. No focal neurological deficits. Extremities: Symmetric 5 x 5 power. Skin: No rashes, lesions or ulcers Psychiatry: Judgement and insight appear normal. Mood & affect appropriate.     Data Reviewed: I have personally reviewed following labs and imaging studies  CBC: Recent Labs  Lab 09/06/19 1805 09/07/19 0615  WBC 19.9* 11.4*  HGB 14.0 12.2*  HCT 41.5 36.9*  MCV 91.4 92.0  PLT 428* 0000000   Basic Metabolic Panel: Recent Labs  Lab 09/06/19 1805 09/07/19 0615  NA 139 141  K 3.5 3.8  CL 105 109  CO2 21* 25  GLUCOSE 95 104*  BUN 9 6  CREATININE 0.85 0.84  CALCIUM 9.3 8.4*   GFR: Estimated Creatinine Clearance: 109.8 mL/min (by C-G formula based on SCr of 0.84 mg/dL). Liver Function Tests: Recent Labs  Lab 09/06/19 1805  AST 20  ALT 18  ALKPHOS 56  BILITOT 0.7  PROT 7.5  ALBUMIN 4.3   Recent Labs  Lab 09/06/19 1805  LIPASE 22   No results for input(s): AMMONIA in the last 168 hours. Coagulation Profile: No results for input(s): INR, PROTIME in the last 168 hours. Cardiac Enzymes: No results for input(s): CKTOTAL, CKMB, CKMBINDEX, TROPONINI in the last 168 hours. BNP (last 3 results) No results for input(s): PROBNP in the last 8760 hours. HbA1C: Recent Labs    09/06/19 2243  HGBA1C 9.8*   CBG: Recent Labs  Lab 09/07/19 0803 09/07/19 1159  GLUCAP 104* 234*   Lipid Profile: No results for input(s): CHOL, HDL, LDLCALC, TRIG, CHOLHDL, LDLDIRECT in the last 72 hours. Thyroid Function Tests: No results for input(s): TSH, T4TOTAL, FREET4, T3FREE, THYROIDAB in the last 72 hours. Anemia Panel: No results for input(s): VITAMINB12, FOLATE, FERRITIN, TIBC, IRON, RETICCTPCT in the last 72 hours. Sepsis Labs: Recent Labs  Lab 09/06/19 2038  LATICACIDVEN 0.9     Recent Results (from the past 240 hour(s))  SARS CORONAVIRUS 2 (TAT 6-24 HRS) Nasopharyngeal Nasopharyngeal Swab     Status: None   Collection Time: 09/06/19  9:20 PM   Specimen: Nasopharyngeal Swab  Result Value Ref Range Status   SARS Coronavirus 2 NEGATIVE NEGATIVE Final    Comment: (NOTE) SARS-CoV-2 target nucleic acids are NOT DETECTED. The SARS-CoV-2 RNA is generally detectable in upper and lower respiratory specimens during the acute phase of infection. Negative results do not preclude SARS-CoV-2 infection, do not rule out co-infections with other pathogens, and should not be used as the sole basis for treatment or other patient management decisions. Negative results must be combined with clinical observations, patient history, and epidemiological information. The expected result is Negative. Fact Sheet for Patients: SugarRoll.be Fact Sheet for Healthcare Providers: https://www.woods-mathews.com/ This test is not yet approved or cleared by the [place] FDA and  has been authorized for detection and/or diagnosis of SARS-CoV-2 by FDA under an Emergency Use Authorization (EUA). This EUA will remain  in effect (meaning this test can be used) for the duration of the COVID-19 declaration under Section 56 4(b)(1) of the Act, 21 U.S.C. section 360bbb-3(b)(1), unless the authorization  is terminated or revoked sooner. Performed at [facility], [place] [address] [place], [place] [address]          Radiology Studies: CT Head Wo Contrast  Result Date: 09/06/2019 CLINICAL DATA:  Shakiness, history of bladder cancer EXAM: CT HEAD WITHOUT CONTRAST TECHNIQUE: Contiguous axial images were obtained from the base of the skull through the vertex without intravenous contrast. COMPARISON:  None. FINDINGS: Brain: No evidence of acute infarction, hemorrhage, hydrocephalus, extra-axial collection or mass lesion/mass effect. Vascular: No  hyperdense vessel or unexpected calcification. Skull: Normal. Negative for fracture or focal lesion. Sinuses/Orbits: No acute finding. Other: None. IMPRESSION: No acute intracranial pathology. Electronically Signed   By: Eddie Candle M.D.   On: 09/06/2019 20:02   CT Abdomen Pelvis W Contrast  Result Date: 09/06/2019 CLINICAL DATA:  Lower abdominal pain, leukocytosis, history of bladder cancer EXAM: CT ABDOMEN AND PELVIS WITH CONTRAST TECHNIQUE: Multidetector CT imaging of the abdomen and pelvis was performed using the standard protocol following bolus administration of intravenous contrast. CONTRAST:  165mL OMNIPAQUE IOHEXOL 300 MG/ML  SOLN COMPARISON:  07/05/2019 FINDINGS: Lower chest: No acute abnormality. Hepatobiliary: No solid liver abnormality is seen. Subcentimeter cyst of the anterior right lobe of the liver (series 2, image 11). No gallstones, gallbladder wall thickening, or biliary dilatation. Pancreas: Unremarkable. No pancreatic ductal dilatation or surrounding inflammatory changes. Spleen: Normal in size without significant abnormality. Adrenals/Urinary Tract: Adrenal glands are unremarkable. Tiny nonobstructive calculus of the inferior pole of the left kidney. Bladder is unremarkable. Stomach/Bowel: Stomach is within normal limits. Appendix appears normal. No evidence of bowel wall thickening, distention, or inflammatory changes. Occasional descending and sigmoid diverticula. There is decompression and some thickening and adjacent fat stranding about the descending colon (series 5, image 43). Vascular/Lymphatic: Aortic atherosclerosis. No enlarged abdominal or pelvic lymph nodes. Reproductive: Mild prostatomegaly. Other: No abdominal wall hernia or abnormality. No abdominopelvic ascites. Musculoskeletal: No acute or significant osseous findings. IMPRESSION: 1. Some thickening and adjacent fat stranding about the descending colon, suggestive of nonspecific infectious, inflammatory, or ischemic  colitis. Occasional descending and sigmoid diverticula which do not appear particularly involved to specifically suggest diverticulitis. 2. No evidence of bladder mass, lymphadenopathy, or metastatic disease in the abdomen or pelvis. 3. Nonobstructive left nephrolithiasis.  No hydronephrosis. 4. Mild prostatomegaly. 5. Aortic Atherosclerosis (ICD10-I70.0). Electronically Signed   By: Eddie Candle M.D.   On: 09/06/2019 20:01        Scheduled Meds: . busPIRone  7.5 mg Oral BID  . enoxaparin (LOVENOX) injection  40 mg Subcutaneous Q24H  . insulin aspart  0-5 Units Subcutaneous QHS  . insulin aspart  0-9 Units Subcutaneous TID WC  . oxybutynin  10 mg Oral Daily  . pantoprazole  40 mg Oral Daily  . simvastatin  20 mg Oral q1800   Continuous Infusions: . sodium chloride 30 mL (09/07/19 1235)  . ciprofloxacin 400 mg (09/07/19 0920)  . metronidazole 500 mg (09/07/19 1237)     LOS: 0 days    Time spent: 35 minutes    Sidney Ace, MD [facility] Pager [phone]  If 7PM-7AM, please contact night-coverage [email or web address] Password [email or web address] 09/07/2019, 1:19 PM

## 2019-09-08 DIAGNOSIS — E860 Dehydration: Secondary | ICD-10-CM

## 2019-09-08 LAB — GLUCOSE, CAPILLARY
Glucose-Capillary: 158 mg/dL — ABNORMAL HIGH (ref 70–99)
Glucose-Capillary: 119 mg/dL — ABNORMAL HIGH (ref 70–99)
Glucose-Capillary: 244 mg/dL — ABNORMAL HIGH (ref 70–99)
Glucose-Capillary: 224 mg/dL — ABNORMAL HIGH (ref 70–99)

## 2019-09-08 LAB — URINE CULTURE: Culture: NO GROWTH

## 2019-09-08 MED ORDER — PHENOL 1.4 % MT LIQD
1.0000 | OROMUCOSAL | Status: DC | PRN
  Administered 2019-09-08: 1 via OROMUCOSAL
  Filled 2019-09-08 (×2): qty 177

## 2019-09-08 MED ORDER — SODIUM CHLORIDE 0.9 % IV SOLN: INTRAVENOUS | Status: AC

## 2019-09-08 MED ORDER — MENTHOL 3 MG MT LOZG
1.0000 | LOZENGE | OROMUCOSAL | Status: DC | PRN
  Administered 2019-09-08: 3 mg via ORAL
  Filled 2019-09-08 (×2): qty 9

## 2019-09-08 MED ORDER — MAGIC MOUTHWASH W/LIDOCAINE: 5.0000 mL | Freq: Three times a day (TID) | ORAL | Status: DC | PRN

## 2019-09-08 MED ORDER — LIDOCAINE VISCOUS HCL 2 % MT SOLN
5.0000 mL | Freq: Three times a day (TID) | OROMUCOSAL | Status: DC | PRN
  Filled 2019-09-08: qty 15

## 2019-09-08 MED ORDER — MAGIC MOUTHWASH
5.0000 mL | Freq: Three times a day (TID) | ORAL | Status: DC | PRN
  Administered 2019-09-08: 5 mL via ORAL
  Filled 2019-09-08 (×2): qty 10

## 2019-09-08 NOTE — Progress Notes (Signed)
PROGRESS NOTE                                                                                                                                                                                                             Patient Demographics:    Charles Beasley, is a 44 y.o. male, DOB - 1976/01/03, [id number]  Admit date - 09/06/2019   Admitting Physician Sidney Ace, MD  Outpatient Primary MD for the patient is [place]  LOS - 1  Outpatient Specialists:  Chief Complaint  Patient presents with  . Abdominal Pain  . Shaking       Brief Narrative   44 y.o.malewith medical history significant fortype 2 diabetes, asthma, hyperlipidemia, depression, and recently diagnosed bladder cancer who presents to the ED for evaluation of worsening lower abdominal pain. CT findings of descending colitis.    Subjective:  Having rt lower and mid quadrant pain. No BM yet. Reports dry mouth.    Assessment  & Plan :    Principal Problem:   Acute Colitis Likely infectious. Continue empiric IV cipro and flagyl. Still has a lot of pain and no BM yet, passing flatus.  Add back IV fluids. continue soft diet and encourage po fluid intake. PRN antiemetics. continue prn IV morphine and oxycodone for pain.   Active Problems:   Diabetes mellitus without complication (HCC) CBG stable. Monitor on SSI.  Hx of asthma  Stable. continue prn albuterol inhaler    Bladder cancer ([place]) Follows with [facility]        Code Status : full  Family Communication  : spoke with wife  Disposition Plan  : home in 24-48 hrs based on clincial  improvement  Barriers For Discharge : ongoing abdominal pain   Consults  :  none  Procedures  : CT abd  DVT Prophylaxis  :  Lovenox -   Lab Results  Component Value Date   PLT 318 09/07/2019    Antibiotics  :   Anti-infectives (From admission, onward)   Start      Dose/Rate Route Frequency Ordered Stop   09/07/19 0800  ciprofloxacin (CIPRO) IVPB 400 mg     400 mg 200 mL/hr over 60 Minutes Intravenous Every 12 hours 09/06/19 2221     09/07/19 0430  metroNIDAZOLE (FLAGYL) IVPB 500 mg  500 mg 100 mL/hr over 60 Minutes Intravenous Every 8 hours 09/06/19 2221     09/06/19 2030  metroNIDAZOLE (FLAGYL) IVPB 500 mg     500 mg 100 mL/hr over 60 Minutes Intravenous  Once 09/06/19 2018 09/06/19 2136   09/06/19 2030  ciprofloxacin (CIPRO) IVPB 400 mg     400 mg 200 mL/hr over 60 Minutes Intravenous  Once 09/06/19 2018 09/06/19 2238        Objective:   Vitals:   09/07/19 1600 09/07/19 1631 09/07/19 1931 09/08/19 0531  BP: 115/77 109/64 118/77 123/90  Pulse: 87 74 85 71  Resp:   16 16  Temp:  97.9 F (36.6 C) 97.7 F (36.5 C) 98 F (36.7 C)  TempSrc:  Oral Oral Oral  SpO2: 97% 97% 97% 97%  Weight:      Height:        Wt Readings from Last 3 Encounters:  09/06/19 69.9 kg  07/04/19 70.8 kg  01/28/19 70.8 kg     Intake/Output Summary (Last 24 hours) at 09/08/2019 [id number] Last data filed at 09/08/2019 [id number] Gross per 24 hour  Intake 889 ml  Output 2950 ml  Net -2061 ml     Physical Exam  Gen: not in distress, fatigued HEENT: no pallor, dry mucosa, supple neck Chest: clear b/l, no added sounds CVS: N S1&S2, no murmurs,  GI: soft, BS+. Rt mid and lower quadrant tender Musculoskeletal: warm, no edema     Data Review:    CBC Recent Labs  Lab 09/06/19 1805 09/07/19 0615  WBC 19.9* 11.4*  HGB 14.0 12.2*  HCT 41.5 36.9*  PLT 428* 318  MCV 91.4 92.0  MCH 30.8 30.4  MCHC 33.7 33.1  RDW 13.2 13.5    Chemistries  Recent Labs  Lab 09/06/19 1805 09/07/19 0615  NA 139 141  K 3.5 3.8  CL 105 109  CO2 21* 25  GLUCOSE 95 104*  BUN 9 6  CREATININE 0.85 0.84  CALCIUM 9.3 8.4*  AST 20  --   ALT 18  --   ALKPHOS 56  --   BILITOT 0.7  --     ------------------------------------------------------------------------------------------------------------------ No results for input(s): CHOL, HDL, LDLCALC, TRIG, CHOLHDL, LDLDIRECT in the last 72 hours.  Lab Results  Component Value Date   HGBA1C 9.8 (H) 09/06/2019   ------------------------------------------------------------------------------------------------------------------ No results for input(s): TSH, T4TOTAL, T3FREE, THYROIDAB in the last 72 hours.  Invalid input(s): FREET3 ------------------------------------------------------------------------------------------------------------------ No results for input(s): VITAMINB12, FOLATE, FERRITIN, TIBC, IRON, RETICCTPCT in the last 72 hours.  Coagulation profile No results for input(s): INR, PROTIME in the last 168 hours.  No results for input(s): DDIMER in the last 72 hours.  Cardiac Enzymes No results for input(s): CKMB, TROPONINI, MYOGLOBIN in the last 168 hours.  Invalid input(s): CK ------------------------------------------------------------------------------------------------------------------ No results found for: BNP  Inpatient Medications  Scheduled Meds: . busPIRone  7.5 mg Oral BID  . enoxaparin (LOVENOX) injection  40 mg Subcutaneous Q24H  . insulin aspart  0-5 Units Subcutaneous QHS  . insulin aspart  0-9 Units Subcutaneous TID WC  . oxybutynin  10 mg Oral Daily  . pantoprazole  40 mg Oral Daily  . simvastatin  20 mg Oral q1800   Continuous Infusions: . sodium chloride Stopped (09/07/19 1433)  . ciprofloxacin 400 mg (09/08/19 0826)  . metronidazole Stopped (09/08/19 0629)   PRN Meds:.sodium chloride, acetaminophen **OR** acetaminophen, albuterol, morphine injection, oxyCODONE, promethazine  Micro Results Recent Results (from the past 240 hour(s))  Urine culture  Status: None   Collection Time: 09/06/19  6:55 PM   Specimen: Urine, Random  Result Value Ref Range Status   Specimen Description  URINE, RANDOM  Final   Special Requests   Final    NONE Performed at [facility], [address] [place], [place] [address]    Culture NO GROWTH  Final   Report Status 09/08/2019 FINAL  Final  SARS CORONAVIRUS 2 (TAT 6-24 HRS) Nasopharyngeal Nasopharyngeal Swab     Status: None   Collection Time: 09/06/19  9:20 PM   Specimen: Nasopharyngeal Swab  Result Value Ref Range Status   SARS Coronavirus 2 NEGATIVE NEGATIVE Final    Comment: (NOTE) SARS-CoV-2 target nucleic acids are NOT DETECTED. The SARS-CoV-2 RNA is generally detectable in upper and lower respiratory specimens during the acute phase of infection. Negative results do not preclude SARS-CoV-2 infection, do not rule out co-infections with other pathogens, and should not be used as the sole basis for treatment or other patient management decisions. Negative results must be combined with clinical observations, patient history, and epidemiological information. The expected result is Negative. Fact Sheet for Patients: SugarRoll.be Fact Sheet for Healthcare Providers: https://www.woods-mathews.com/ This test is not yet approved or cleared by the [place] FDA and  has been authorized for detection and/or diagnosis of SARS-CoV-2 by FDA under an Emergency Use Authorization (EUA). This EUA will remain  in effect (meaning this test can be used) for the duration of the COVID-19 declaration under Section 56 4(b)(1) of the Act, 21 U.S.C. section 360bbb-3(b)(1), unless the authorization is terminated or revoked sooner. Performed at [facility], [place] [address] [place], [place] [address]     Radiology Reports CT Head Wo Contrast  Result Date: 09/06/2019 CLINICAL DATA:  Shakiness, history of bladder cancer EXAM: CT HEAD WITHOUT CONTRAST TECHNIQUE: Contiguous axial images were obtained from the base of the skull through the vertex without intravenous contrast. COMPARISON:   None. FINDINGS: Brain: No evidence of acute infarction, hemorrhage, hydrocephalus, extra-axial collection or mass lesion/mass effect. Vascular: No hyperdense vessel or unexpected calcification. Skull: Normal. Negative for fracture or focal lesion. Sinuses/Orbits: No acute finding. Other: None. IMPRESSION: No acute intracranial pathology. Electronically Signed   By: Eddie Candle M.D.   On: 09/06/2019 20:02   CT Abdomen Pelvis W Contrast  Result Date: 09/06/2019 CLINICAL DATA:  Lower abdominal pain, leukocytosis, history of bladder cancer EXAM: CT ABDOMEN AND PELVIS WITH CONTRAST TECHNIQUE: Multidetector CT imaging of the abdomen and pelvis was performed using the standard protocol following bolus administration of intravenous contrast. CONTRAST:  156mL OMNIPAQUE IOHEXOL 300 MG/ML  SOLN COMPARISON:  07/05/2019 FINDINGS: Lower chest: No acute abnormality. Hepatobiliary: No solid liver abnormality is seen. Subcentimeter cyst of the anterior right lobe of the liver (series 2, image 11). No gallstones, gallbladder wall thickening, or biliary dilatation. Pancreas: Unremarkable. No pancreatic ductal dilatation or surrounding inflammatory changes. Spleen: Normal in size without significant abnormality. Adrenals/Urinary Tract: Adrenal glands are unremarkable. Tiny nonobstructive calculus of the inferior pole of the left kidney. Bladder is unremarkable. Stomach/Bowel: Stomach is within normal limits. Appendix appears normal. No evidence of bowel wall thickening, distention, or inflammatory changes. Occasional descending and sigmoid diverticula. There is decompression and some thickening and adjacent fat stranding about the descending colon (series 5, image 43). Vascular/Lymphatic: Aortic atherosclerosis. No enlarged abdominal or pelvic lymph nodes. Reproductive: Mild prostatomegaly. Other: No abdominal wall hernia or abnormality. No abdominopelvic ascites. Musculoskeletal: No acute or significant osseous findings.  IMPRESSION: 1. Some  thickening and adjacent fat stranding about the descending colon, suggestive of nonspecific infectious, inflammatory, or ischemic colitis. Occasional descending and sigmoid diverticula which do not appear particularly involved to specifically suggest diverticulitis. 2. No evidence of bladder mass, lymphadenopathy, or metastatic disease in the abdomen or pelvis. 3. Nonobstructive left nephrolithiasis.  No hydronephrosis. 4. Mild prostatomegaly. 5. Aortic Atherosclerosis (ICD10-I70.0). Electronically Signed   By: Eddie Candle M.D.   On: 09/06/2019 20:01    Time Spent in minutes 35   Otniel Hoe M.D on 09/08/2019 at 9:21 AM  Between 7am to 7pm - Pager - [phone]  After 7pm go to [email or web address] - password [facility]  [facility] -  Office  [phone]

## 2019-09-08 NOTE — Progress Notes (Addendum)
Inpatient Diabetes Program Recommendations  AACE/ADA: New Consensus Statement on Inpatient Glycemic Control (2015)  Target Ranges:  Prepandial:   less than 140 mg/dL      Peak postprandial:   less than 180 mg/dL (1-2 hours)      Critically ill patients:  140 - 180 mg/dL   Results for Charles Beasley, Charles Beasley (MRN [id number]) as of 09/08/2019 10:28  Ref. Range 09/07/2019 08:03 09/07/2019 11:59 09/07/2019 16:30 09/07/2019 21:32  Glucose-Capillary Latest Ref Range: 70 - 99 mg/dL 104 (H) 234 (H)  3 units NOVOLOG  176 (H)  2 units NOVOLOG  233 (H)  2 units NOVOLOG    Results for Charles Beasley, Charles Beasley (MRN [id number]) as of 09/08/2019 10:28  Ref. Range 09/08/2019 07:39  Glucose-Capillary Latest Ref Range: 70 - 99 mg/dL 158 (H)   Results for Charles Beasley, Charles Beasley (MRN [id number]) as of 09/08/2019 10:28  Ref. Range 09/06/2019 22:43  Hemoglobin A1C Latest Ref Range: 4.8 - 5.6 % 9.8 (H)  (234 mg/dl)    Admit with: Abd Pain/ Colitis  History: DM, Recently diagnosed Bladder cancer  Home DM Meds: Glipizide 5 mg Daily       Metformin 1000 mg BID  Current Orders: Novolog Sensitive Correction Scale/ SSI (0-9 units) TID AC + HS   PCP: [facility] [place]     MD- Please consider increasing Novolog SSI to the Moderate scale (0-15 units) TID AC + HS while home oral DM meds are on hold   Addendum 11:35am--Met w/ pt today to discuss current A1c of 9.8%.  With current Colitis issues and recent Bladder cancer diagnosis, pt stated he has been under a lot of stress.  Checks CBGs at home 1-2 times per day.  Has PCP he [facility].  Told me he only takes the Metformin at home--Asked him when he stopped the Glipizide and he did not know.  I question if patient really is taking the Glipizide and just forgot??  Discussed w/ pt that goal A1c would be 7% or less to keep his vascular system and organs healthy.  Pt stated he understood and feels like once he gets his cancer issues under control that his CBGs will improve.  Did  not have any questions for me regarding his diabetes care.  Explained to pt that we have his oral DM meds on hold right now and will give him insulin in the hospital for elevated CBGs until he goes home.  Pt stated he was OK with that.    --Will follow patient during hospitalization--  Wyn Quaker RN, MSN, CDE Diabetes Coordinator Inpatient Glycemic Control Team Team Pager: [phone] (8a-5p)

## 2019-09-09 DIAGNOSIS — K529 Noninfective gastroenteritis and colitis, unspecified: Secondary | ICD-10-CM

## 2019-09-09 DIAGNOSIS — E119 Type 2 diabetes mellitus without complications: Secondary | ICD-10-CM

## 2019-09-09 DIAGNOSIS — K1230 Oral mucositis (ulcerative), unspecified: Secondary | ICD-10-CM

## 2019-09-09 DIAGNOSIS — R1030 Lower abdominal pain, unspecified: Secondary | ICD-10-CM

## 2019-09-09 LAB — BASIC METABOLIC PANEL
Anion gap: 8 (ref 5–15)
BUN: 12 mg/dL (ref 6–20)
CO2: 26 mmol/L (ref 22–32)
Calcium: 8.8 mg/dL — ABNORMAL LOW (ref 8.9–10.3)
Chloride: 105 mmol/L (ref 98–111)
Creatinine, Ser: 0.79 mg/dL (ref 0.61–1.24)
GFR calc Af Amer: >60 mL/min (ref >60)
GFR calc non Af Amer: >60 mL/min (ref >60)
Glucose, Bld: 153 mg/dL — ABNORMAL HIGH (ref 70–99)
Potassium: 4 mmol/L (ref 3.5–5.1)
Sodium: 139 mmol/L (ref 135–145)

## 2019-09-09 LAB — GLUCOSE, CAPILLARY
Glucose-Capillary: 151 mg/dL — ABNORMAL HIGH (ref 70–99)
Glucose-Capillary: 110 mg/dL — ABNORMAL HIGH (ref 70–99)
Glucose-Capillary: 163 mg/dL — ABNORMAL HIGH (ref 70–99)
Glucose-Capillary: 207 mg/dL — ABNORMAL HIGH (ref 70–99)

## 2019-09-09 MED ORDER — MAGIC MOUTHWASH
10.0000 mL | Freq: Four times a day (QID) | ORAL | Status: DC | PRN
  Administered 2019-09-09 (×2): 10 mL via ORAL
  Filled 2019-09-09 (×4): qty 10

## 2019-09-09 MED ORDER — ENSURE ENLIVE PO LIQD
237.0000 mL | Freq: Three times a day (TID) | ORAL | Status: DC
  Administered 2019-09-09 – 2019-09-10 (×3): 237 mL via ORAL

## 2019-09-09 MED ORDER — SODIUM CHLORIDE 0.9 % IV SOLN: INTRAVENOUS | Status: DC

## 2019-09-09 MED ORDER — ADULT MULTIVITAMIN W/MINERALS CH
1.0000 | ORAL_TABLET | Freq: Every day | ORAL | Status: DC
  Administered 2019-09-10: 1 via ORAL
  Filled 2019-09-09: qty 1

## 2019-09-09 MED ORDER — INSULIN ASPART 100 UNIT/ML ~~LOC~~ SOLN
0.0000 [IU] | Freq: Three times a day (TID) | SUBCUTANEOUS | Status: DC
  Administered 2019-09-09 – 2019-09-10 (×3): 3 [IU] via SUBCUTANEOUS
  Filled 2019-09-09 (×3): qty 1

## 2019-09-09 MED ORDER — LIDOCAINE VISCOUS HCL 2 % MT SOLN
5.0000 mL | Freq: Three times a day (TID) | OROMUCOSAL | Status: DC | PRN
  Administered 2019-09-09 (×2): 5 mL via OROMUCOSAL
  Filled 2019-09-09 (×5): qty 5

## 2019-09-09 NOTE — Progress Notes (Signed)
Initial Nutrition Assessment  DOCUMENTATION CODES:   Not applicable  INTERVENTION:   Ensure Enlive po TID, each supplement provides 350 kcal and 20 grams of protein  MVI daily   NUTRITION DIAGNOSIS:   Inadequate oral intake related to acute illness as evidenced by per patient/family report.  GOAL:   Patient will meet greater than or equal to 90% of their needs  MONITOR:   PO intake, Supplement acceptance, Labs, Weight trends, Skin, I & O's  REASON FOR ASSESSMENT:   Consult Assessment of nutrition requirement/status  ASSESSMENT:   44 y.o. male with medical history significant for type 2 diabetes, asthma, hyperlipidemia, depression, and recently diagnosed bladder cancer who presents to the ED for evaluation of worsening lower abdominal pain. CT findings of descending colitis.   Met with pt in room today. Pt reports fair appetite and oral intake at baseline; pt reports that he eats 1-2 meals per day at home. Pt reports that he currently has thrush, which is making his tongue sore; this is making it difficult for patient to eat. Pt reports that he was unable to eat his breakfast this morning because his pancakes were very dry. Pt is also worried about his wife who is currently admitted into the ICU. Pt documented to be eating 100% of meals yesterday. Pt reports that he does not usually drink supplements at home but he is willing to try strawberry or chocolate supplements while in hospital. RD will add Ensure between meals. RD will monitor pt's blood glucoses and adjust supplements if needed. Per chart, pt appears weight stable at baseline.   Medications reviewed and include: lovenox, insulin, protonix, metronidazole, ciprofloxacin   Labs reviewed: wbc 11.4(H) cbgs- 151, 110 x 24 hrs AIC 9.8(H)- 2/6  NUTRITION - FOCUSED PHYSICAL EXAM:    Most Recent Value  Orbital Region  No depletion  Upper Arm Region  No depletion  Thoracic and Lumbar Region  No depletion  Buccal Region   No depletion  Temple Region  Mild depletion  Clavicle Bone Region  Mild depletion  Clavicle and Acromion Bone Region  Mild depletion  Scapular Bone Region  No depletion  Dorsal Hand  Mild depletion  Patellar Region  Mild depletion  Anterior Thigh Region  Mild depletion  Posterior Calf Region  Mild depletion  Edema (RD Assessment)  None  Hair  Reviewed  Eyes  Reviewed  Mouth  Reviewed  Skin  Reviewed  Nails  Reviewed     Diet Order:   Diet Order            DIET SOFT Room service appropriate? Yes; Fluid consistency: Thin  Diet effective now             EDUCATION NEEDS:   Education needs have been addressed  Skin:  Skin Assessment: Reviewed RN Assessment  Last BM:  2/6  Height:   Ht Readings from Last 1 Encounters:  09/06/19 '5\' 9"'  (1.753 m)    Weight:   Wt Readings from Last 1 Encounters:  09/06/19 69.9 kg    Ideal Body Weight:  72.7 kg  BMI:  Body mass index is 22.74 kg/m.  Estimated Nutritional Needs:   Kcal:  2100-2400kcal/day  Protein:  105-120g/day  Fluid:  >2.1L/day  Koleen Distance MS, RD, LDN Contact information available in Amion

## 2019-09-09 NOTE — Progress Notes (Addendum)
PROGRESS NOTE                                                                                                                                                                                                             Patient Demographics:    Charles Beasley, is a 44 y.o. male, DOB - [birth year], ZE:[id number]  Admit date - 09/06/2019   Admitting Physician Sidney Ace, MD  Outpatient Primary MD for the patient is [place], [facility]  LOS - 2  Outpatient Specialists:  Chief Complaint  Patient presents with  . Abdominal Pain  . Shaking       Brief Narrative   44 y.o.malewith medical history significant fortype 2 diabetes, asthma, hyperlipidemia, depression, and recently diagnosed bladder cancer who presents to the ED for evaluation of worsening lower abdominal pain. CT findings of descending colitis.    Subjective:  Patient still having periumbilical and right lower quadrant abdominal pain (some improvement from yesterday).  No nausea or vomiting but still complains of mild soreness.  Patient worried about his wife was admitted to the ICU.   Assessment  & Plan :    Principal Problem:   Acute Colitis Likely infectious. Continue empiric IV cipro and flagyl.  Still no bowel movement but passing flatus.  Abdominal pain minimally improved from yesterday. Continue IV fluids, soft diet and increase p.o. intake.  As needed IV morphine.   Active Problems:   Diabetes mellitus without complication (HCC) CBG stable.  Switch to moderate sliding scale coverage.  Mucositis Added Magic mouthwash.  No oral thrush.   Hx of asthma  Stable. continue prn albuterol inhaler    Bladder cancer ([place]) Follows with [facility]  Poor p.o. intake Nutrition consulted.      Code Status : full  Family Communication  : None at bedside  Disposition Plan  : Home possibly in the next 24-48 hours if abdominal pain  improved and tolerating diet.  Barriers For Discharge : Persistent abdominal pain  Consults  :  none  Procedures  : CT abd  DVT Prophylaxis  :  Lovenox -   Lab Results  Component Value Date   PLT 318 09/07/2019    Antibiotics  :   Anti-infectives (From admission, onward)   Start     Dose/Rate Route Frequency Ordered Stop   09/07/19  0800  ciprofloxacin (CIPRO) IVPB 400 mg     400 mg 200 mL/hr over 60 Minutes Intravenous Every 12 hours 09/06/19 2221     09/07/19 0430  metroNIDAZOLE (FLAGYL) IVPB 500 mg     500 mg 100 mL/hr over 60 Minutes Intravenous Every 8 hours 09/06/19 2221     09/06/19 2030  metroNIDAZOLE (FLAGYL) IVPB 500 mg     500 mg 100 mL/hr over 60 Minutes Intravenous  Once 09/06/19 2018 09/06/19 2136   09/06/19 2030  ciprofloxacin (CIPRO) IVPB 400 mg     400 mg 200 mL/hr over 60 Minutes Intravenous  Once 09/06/19 2018 09/06/19 2238        Objective:   Vitals:   09/08/19 0531 09/08/19 1158 09/08/19 2004 09/09/19 0441  BP: 123/90 125/79 113/75 131/90  Pulse: 71 74 77 70  Resp: 16 16 20 20   Temp: 98 F (36.7 C) 97.7 F (36.5 C) 97.6 F (36.4 C) (!) 97.5 F (36.4 C)  TempSrc: Oral Oral Oral Oral  SpO2: 97% 99% 98% 98%  Weight:      Height:        Wt Readings from Last 3 Encounters:  09/06/19 69.9 kg  07/04/19 70.8 kg  01/28/19 70.8 kg     Intake/Output Summary (Last 24 hours) at 09/09/2019 1140 Last data filed at 09/09/2019 0756 Gross per 24 hour  Intake 2351.99 ml  Output 2475 ml  Net -123.01 ml    Physical exam In some distress with pain HEENT: Dry mucosa, supple neck, no oral thrush Chest: Clear CVs: Normal S1-S2 GI: Soft, nondistended, right mid, lower and periumbilical tenderness, bowel sounds present Musculoskeletal: Warm, no edema     Data Review:    CBC Recent Labs  Lab 09/06/19 1805 09/07/19 0615  WBC 19.9* 11.4*  HGB 14.0 12.2*  HCT 41.5 36.9*  PLT 428* 318  MCV 91.4 92.0  MCH 30.8 30.4  MCHC 33.7 33.1  RDW 13.2  13.5    Chemistries  Recent Labs  Lab 09/06/19 1805 09/07/19 0615 09/09/19 0614  NA 139 141 139  K 3.5 3.8 4.0  CL 105 109 105  CO2 21* 25 26  GLUCOSE 95 104* 153*  BUN 9 6 12   CREATININE 0.85 0.84 0.79  CALCIUM 9.3 8.4* 8.8*  AST 20  --   --   ALT 18  --   --   ALKPHOS 56  --   --   BILITOT 0.7  --   --    ------------------------------------------------------------------------------------------------------------------ No results for input(s): CHOL, HDL, LDLCALC, TRIG, CHOLHDL, LDLDIRECT in the last 72 hours.  Lab Results  Component Value Date   HGBA1C 9.8 (H) 09/06/2019   ------------------------------------------------------------------------------------------------------------------ No results for input(s): TSH, T4TOTAL, T3FREE, THYROIDAB in the last 72 hours.  Invalid input(s): FREET3 ------------------------------------------------------------------------------------------------------------------ No results for input(s): VITAMINB12, FOLATE, FERRITIN, TIBC, IRON, RETICCTPCT in the last 72 hours.  Coagulation profile No results for input(s): INR, PROTIME in the last 168 hours.  No results for input(s): DDIMER in the last 72 hours.  Cardiac Enzymes No results for input(s): CKMB, TROPONINI, MYOGLOBIN in the last 168 hours.  Invalid input(s): CK ------------------------------------------------------------------------------------------------------------------ No results found for: BNP  Inpatient Medications  Scheduled Meds: . busPIRone  7.5 mg Oral BID  . enoxaparin (LOVENOX) injection  40 mg Subcutaneous Q24H  . insulin aspart  0-15 Units Subcutaneous TID WC  . insulin aspart  0-5 Units Subcutaneous QHS  . oxybutynin  10 mg Oral Daily  . pantoprazole  40 mg Oral Daily  . simvastatin  20 mg Oral q1800   Continuous Infusions: . sodium chloride Stopped (09/07/19 1433)  . ciprofloxacin 400 mg (09/09/19 0805)  . metronidazole Stopped (09/09/19 0554)   PRN  Meds:.sodium chloride, acetaminophen **OR** acetaminophen, albuterol, magic mouthwash **AND** lidocaine, menthol-cetylpyridinium, morphine injection, oxyCODONE, phenol, promethazine  Micro Results Recent Results (from the past 240 hour(s))  Urine culture     Status: None   Collection Time: 09/06/19  6:55 PM   Specimen: Urine, Random  Result Value Ref Range Status   Specimen Description URINE, RANDOM  Final   Special Requests   Final    NONE Performed at [facility], [address] [place], [place] [address]    Culture NO GROWTH  Final   Report Status 09/08/2019 FINAL  Final  SARS CORONAVIRUS 2 (TAT 6-24 HRS) Nasopharyngeal Nasopharyngeal Swab     Status: None   Collection Time: 09/06/19  9:20 PM   Specimen: Nasopharyngeal Swab  Result Value Ref Range Status   SARS Coronavirus 2 NEGATIVE NEGATIVE Final    Comment: (NOTE) SARS-CoV-2 target nucleic acids are NOT DETECTED. The SARS-CoV-2 RNA is generally detectable in upper and lower respiratory specimens during the acute phase of infection. Negative results do not preclude SARS-CoV-2 infection, do not rule out co-infections with other pathogens, and should not be used as the sole basis for treatment or other patient management decisions. Negative results must be combined with clinical observations, patient history, and epidemiological information. The expected result is Negative. Fact Sheet for Patients: SugarRoll.be Fact Sheet for Healthcare Providers: https://www.woods-mathews.com/ This test is not yet approved or cleared by the [place] FDA and  has been authorized for detection and/or diagnosis of SARS-CoV-2 by FDA under an Emergency Use Authorization (EUA). This EUA will remain  in effect (meaning this test can be used) for the duration of the COVID-19 declaration under Section 56 4(b)(1) of the Act, 21 U.S.C. section 360bbb-3(b)(1), unless the authorization is  terminated or revoked sooner. Performed at [facility], [place] [address], [place], [place] [address]     Radiology Reports CT Head Wo Contrast  Result Date: 09/06/2019 CLINICAL DATA:  Shakiness, history of bladder cancer EXAM: CT HEAD WITHOUT CONTRAST TECHNIQUE: Contiguous axial images were obtained from the base of the skull through the vertex without intravenous contrast. COMPARISON:  None. FINDINGS: Brain: No evidence of acute infarction, hemorrhage, hydrocephalus, extra-axial collection or mass lesion/mass effect. Vascular: No hyperdense vessel or unexpected calcification. Skull: Normal. Negative for fracture or focal lesion. Sinuses/Orbits: No acute finding. Other: None. IMPRESSION: No acute intracranial pathology. Electronically Signed   By: Eddie Candle M.D.   On: 09/06/2019 20:02   CT Abdomen Pelvis W Contrast  Result Date: 09/06/2019 CLINICAL DATA:  Lower abdominal pain, leukocytosis, history of bladder cancer EXAM: CT ABDOMEN AND PELVIS WITH CONTRAST TECHNIQUE: Multidetector CT imaging of the abdomen and pelvis was performed using the standard protocol following bolus administration of intravenous contrast. CONTRAST:  118mL OMNIPAQUE IOHEXOL 300 MG/ML  SOLN COMPARISON:  07/05/2019 FINDINGS: Lower chest: No acute abnormality. Hepatobiliary: No solid liver abnormality is seen. Subcentimeter cyst of the anterior right lobe of the liver (series 2, image 11). No gallstones, gallbladder wall thickening, or biliary dilatation. Pancreas: Unremarkable. No pancreatic ductal dilatation or surrounding inflammatory changes. Spleen: Normal in size without significant abnormality. Adrenals/Urinary Tract: Adrenal glands are unremarkable. Tiny nonobstructive calculus of the inferior pole of the left kidney. Bladder is unremarkable. Stomach/Bowel: Stomach is within normal  limits. Appendix appears normal. No evidence of bowel wall thickening, distention, or inflammatory changes. Occasional descending and  sigmoid diverticula. There is decompression and some thickening and adjacent fat stranding about the descending colon (series 5, image 43). Vascular/Lymphatic: Aortic atherosclerosis. No enlarged abdominal or pelvic lymph nodes. Reproductive: Mild prostatomegaly. Other: No abdominal wall hernia or abnormality. No abdominopelvic ascites. Musculoskeletal: No acute or significant osseous findings. IMPRESSION: 1. Some thickening and adjacent fat stranding about the descending colon, suggestive of nonspecific infectious, inflammatory, or ischemic colitis. Occasional descending and sigmoid diverticula which do not appear particularly involved to specifically suggest diverticulitis. 2. No evidence of bladder mass, lymphadenopathy, or metastatic disease in the abdomen or pelvis. 3. Nonobstructive left nephrolithiasis.  No hydronephrosis. 4. Mild prostatomegaly. 5. Aortic Atherosclerosis (ICD10-I70.0). Electronically Signed   By: Eddie Candle M.D.   On: 09/06/2019 20:01    Time Spent in minutes 35   Rainelle Sulewski M.D on 09/09/2019 at 11:40 AM  Between 7am to 7pm - Pager - [phone]  After 7pm go to [email or web address] - password [email or web address]  [facility] -  Office  [phone]

## 2019-09-10 LAB — BASIC METABOLIC PANEL
Anion gap: 10 (ref 5–15)
BUN: 14 mg/dL (ref 6–20)
CO2: 26 mmol/L (ref 22–32)
Calcium: 9.1 mg/dL (ref 8.9–10.3)
Chloride: 103 mmol/L (ref 98–111)
Creatinine, Ser: 0.86 mg/dL (ref 0.61–1.24)
GFR calc Af Amer: >60 mL/min (ref >60)
GFR calc non Af Amer: >60 mL/min (ref >60)
Glucose, Bld: 201 mg/dL — ABNORMAL HIGH (ref 70–99)
Potassium: 4.2 mmol/L (ref 3.5–5.1)
Sodium: 139 mmol/L (ref 135–145)

## 2019-09-10 LAB — CBC
HCT: 43.2 % (ref 39.0–52.0)
Hemoglobin: 14.3 g/dL (ref 13.0–17.0)
MCH: 30.2 pg (ref 26.0–34.0)
MCHC: 33.1 g/dL (ref 30.0–36.0)
MCV: 91.3 fL (ref 80.0–100.0)
Platelets: 331 10*3/uL (ref 150–400)
RBC: 4.73 MIL/uL (ref 4.22–5.81)
RDW: 12.8 % (ref 11.5–15.5)
WBC: 10.1 10*3/uL (ref 4.0–10.5)
nRBC: 0 % (ref 0.0–0.2)

## 2019-09-10 LAB — GLUCOSE, CAPILLARY: Glucose-Capillary: 165 mg/dL — ABNORMAL HIGH (ref 70–99)

## 2019-09-10 MED ORDER — CIPROFLOXACIN HCL 500 MG PO TABS: 500.0000 mg | ORAL_TABLET | Freq: Two times a day (BID) | ORAL | 0 refills | Status: AC

## 2019-09-10 MED ORDER — MAGIC MOUTHWASH: 10.0000 mL | Freq: Four times a day (QID) | ORAL | 2 refills | Status: AC | PRN

## 2019-09-10 MED ORDER — ACETAMINOPHEN 325 MG PO TABS: 650.0000 mg | ORAL_TABLET | Freq: Four times a day (QID) | ORAL | Status: DC | PRN

## 2019-09-10 MED ORDER — METRONIDAZOLE 500 MG PO TABS: 500.0000 mg | ORAL_TABLET | Freq: Three times a day (TID) | ORAL | 0 refills | Status: AC

## 2019-09-10 MED ORDER — OXYCODONE HCL 5 MG PO TABS: 5.0000 mg | ORAL_TABLET | Freq: Four times a day (QID) | ORAL | 0 refills | Status: AC | PRN

## 2019-09-10 NOTE — Discharge Summary (Signed)
Physician Discharge Summary  Charles Beasley [id number] DOB: [birth year] DOA: 09/06/2019  PCP: [facility]  Admit date: 09/06/2019 Discharge date: 09/10/2019  Admitted From: home Disposition:  home  Recommendations for Outpatient Follow-up:  1. Follow up with PCP in 1-2 weeks  Home Health: no  Equipment/Devices: no   Discharge Condition: stable CODE STATUS: full  Diet recommendation: Heart Healthy   Brief/Interim Summary: HPI was taken from Dr. Frederico Hamman. [name] is a 44 y.o. male with medical history significant for type 2 diabetes, asthma, hyperlipidemia, depression, and recently diagnosed bladder cancer who presents to the ED for evaluation of worsening lower abdominal pain.  Patient was initially seen by his PCP on 09/02/2019 for right-sided lower abdominal pain.  He was started on treatment for presumed right-sided diverticulitis with oral ciprofloxacin and Flagyl.  He was seen in follow-up on 09/05/2019 at which time his symptoms were documented to be improving.  Patient returns to the ED today for further evaluation of persistent right-sided and bilateral lower abdominal pain.  He says the pain also radiates to his back.  He has been feeling generally weak, fatigued, with occasional cold chills, diaphoresis, and rigors.  He has had nausea without emesis.  He reports one episode of watery stool without any bowel movements today.  He reports frequent urination without dysuria.  He also has been having intermittent headaches and occasionally seeing floaters in his vision.  He is feeling dehydrated.  ED Course:  Initial vitals showed BP 138/86, pulse 98, RR 18, temp 98.5 Fahrenheit, SPO2 100% on room air.  Labs are notable for WBC 19.9, hemoglobin 14.0, platelets 428,000, sodium 139, potassium 3.5, bicarb 21, BUN 9, creatinine 0.85, LFTs within normal limits, lipase 22, lactic acid 0.9.  Urinalysis showed negative nitrites, negative leukocytes, 0-5 RBC/hpf, 0-5  WBCs/hpf, no bacteria on microscopy.  Urine culture was obtained and pending.  SARS-CoV-2 PCR test was obtained and pending.  CT abdomen/pelvis with contrast showed changes suggestive of descending colitis.  Descending and sigmoid diverticula were seen without evidence of diverticulitis.  There was no evidence of bladder mass, lymphadenopathy, or metastatic disease in the abdomen or pelvis.  Nonobstructive left nephrolithiasis without hydronephrosis was seen.  Mild prostatomegaly also noted.  CT head without contrast was negative for acute intracranial pathology.  Patient was given 1 L normal saline, IV ciprofloxacin and Flagyl, and IV morphine 4 mg x 2.  The hospitalist service was consulted to admit for further evaluation and management.  Hospital Course from Dr. Lenise Herald 09/10/19: Pt was found to have acute colitis and was started on IV cirpo and flagyl. Pt responded well to treatment. Furthermore, pt was found to have mucositis (not oral thrush) and was started on magic mouthwash which improved pt symptoms but not resolved by the time of d/c. Pt was d/c home w/ po cipro, flagyl to complete the course at home. Pt was d/c w/ magic mouthwash & oxycodone.    Acute Colitis: likely infectious. Continue empiric  cipro and flagyl.  Still no bowel movement but passing flatus.  Abdominal pain improved  DM2:  continue on SSI w/ accuchecks while inpatient   Mucositis: continue on magic mouthwash.  No oral thrush.   Hx of asthma: stable. Albuterol prn   Bladder cancer: will f/u w/ [facility] as an outpatient  Poor p.o. intake: continue w/ ensure   Discharge Diagnoses:  Principal Problem:   Colitis Active Problems:   Diabetes mellitus without complication ([place])  Asthma   Bladder cancer [facility])    Discharge Instructions  Discharge Instructions    Diet - low sodium heart healthy   Complete by: As directed    Discharge instructions   Complete by: As directed    F/U PCP in  1 week   Increase activity slowly   Complete by: As directed      Allergies as of 09/10/2019      Reactions   Montelukast Photosensitivity   Sertraline Other (See Comments)   Feels out of it, zombie      Medication List    TAKE these medications   acetaminophen 325 MG tablet Commonly known as: TYLENOL Take 2 tablets (650 mg total) by mouth every 6 (six) hours as needed for mild pain or fever (or Fever >/= 101).   albuterol 108 (90 Base) MCG/ACT inhaler Commonly known as: VENTOLIN HFA Inhale 2 puffs into the lungs every 6 (six) hours as needed. Reported on 11/08/2015   busPIRone 7.5 MG tablet Commonly known as: BUSPAR Take 7.5 mg by mouth 2 (two) times daily.   ciprofloxacin 500 MG tablet Commonly known as: Cipro Take 1 tablet (500 mg total) by mouth 2 (two) times daily for 3 days.   fexofenadine 180 MG tablet Commonly known as: ALLEGRA Take 180 mg by mouth daily as needed for allergies.   glipiZIDE 5 MG 24 hr tablet Commonly known as: GLUCOTROL XL Take 5 mg by mouth daily.   magic mouthwash Soln Take 10 mLs by mouth 4 (four) times daily as needed for up to 5 days for mouth pain.   meloxicam 7.5 MG tablet Commonly known as: MOBIC Take 7.5 mg by mouth daily.   metFORMIN 1000 MG tablet Commonly known as: GLUCOPHAGE Take 1,000 mg by mouth 2 (two) times daily.   metroNIDAZOLE 500 MG tablet Commonly known as: Flagyl Take 1 tablet (500 mg total) by mouth 3 (three) times daily for 3 days.   omeprazole 20 MG capsule Commonly known as: PRILOSEC Take 20 mg by mouth 2 (two) times daily.   oxybutynin 10 MG 24 hr tablet Commonly known as: DITROPAN-XL Take 10 mg by mouth daily.   oxyCODONE 5 MG immediate release tablet Commonly known as: Oxy IR/ROXICODONE Take 1 tablet (5 mg total) by mouth every 6 (six) hours as needed for up to 5 days for moderate pain or severe pain.   promethazine 25 MG tablet Commonly known as: PHENERGAN Take 25 mg by mouth every 6 (six) hours  as needed for nausea.   simvastatin 20 MG tablet Commonly known as: ZOCOR Take 20 mg by mouth at bedtime.      Follow-up Information    [place], [facility]. Go on 09/17/2019.   Why: 1:20pm appointment Contact information: [place] [address] [phone]          Allergies  Allergen Reactions  . Montelukast Photosensitivity  . Sertraline Other (See Comments)    Feels out of it, zombie    Consultations:  n/a   Procedures/Studies: CT Head Wo Contrast  Result Date: 09/06/2019 CLINICAL DATA:  Shakiness, history of bladder cancer EXAM: CT HEAD WITHOUT CONTRAST TECHNIQUE: Contiguous axial images were obtained from the base of the skull through the vertex without intravenous contrast. COMPARISON:  None. FINDINGS: Brain: No evidence of acute infarction, hemorrhage, hydrocephalus, extra-axial collection or mass lesion/mass effect. Vascular: No hyperdense vessel or unexpected calcification. Skull: Normal. Negative for fracture or focal lesion. Sinuses/Orbits: No acute finding. Other: None. IMPRESSION:  No acute intracranial pathology. Electronically Signed   By: Eddie Candle M.D.   On: 09/06/2019 20:02   CT Abdomen Pelvis W Contrast  Result Date: 09/06/2019 CLINICAL DATA:  Lower abdominal pain, leukocytosis, history of bladder cancer EXAM: CT ABDOMEN AND PELVIS WITH CONTRAST TECHNIQUE: Multidetector CT imaging of the abdomen and pelvis was performed using the standard protocol following bolus administration of intravenous contrast. CONTRAST:  152mL OMNIPAQUE IOHEXOL 300 MG/ML  SOLN COMPARISON:  07/05/2019 FINDINGS: Lower chest: No acute abnormality. Hepatobiliary: No solid liver abnormality is seen. Subcentimeter cyst of the anterior right lobe of the liver (series 2, image 11). No gallstones, gallbladder wall thickening, or biliary dilatation. Pancreas: Unremarkable. No pancreatic ductal dilatation or surrounding inflammatory changes. Spleen: Normal in size without  significant abnormality. Adrenals/Urinary Tract: Adrenal glands are unremarkable. Tiny nonobstructive calculus of the inferior pole of the left kidney. Bladder is unremarkable. Stomach/Bowel: Stomach is within normal limits. Appendix appears normal. No evidence of bowel wall thickening, distention, or inflammatory changes. Occasional descending and sigmoid diverticula. There is decompression and some thickening and adjacent fat stranding about the descending colon (series 5, image 43). Vascular/Lymphatic: Aortic atherosclerosis. No enlarged abdominal or pelvic lymph nodes. Reproductive: Mild prostatomegaly. Other: No abdominal wall hernia or abnormality. No abdominopelvic ascites. Musculoskeletal: No acute or significant osseous findings. IMPRESSION: 1. Some thickening and adjacent fat stranding about the descending colon, suggestive of nonspecific infectious, inflammatory, or ischemic colitis. Occasional descending and sigmoid diverticula which do not appear particularly involved to specifically suggest diverticulitis. 2. No evidence of bladder mass, lymphadenopathy, or metastatic disease in the abdomen or pelvis. 3. Nonobstructive left nephrolithiasis.  No hydronephrosis. 4. Mild prostatomegaly. 5. Aortic Atherosclerosis (ICD10-I70.0). Electronically Signed   By: Eddie Candle M.D.   On: 09/06/2019 20:01       Subjective: Pt c/o fatigue  Discharge Exam: Vitals:   09/09/19 2019 09/10/19 0454  BP: 122/83 116/82  Pulse: 74 66  Resp: 20 20  Temp: 98 F (36.7 C) 98.1 F (36.7 C)  SpO2: 98% 96%   Vitals:   09/09/19 0441 09/09/19 1211 09/09/19 2019 09/10/19 0454  BP: 131/90 123/87 122/83 116/82  Pulse: 70 72 74 66  Resp: 20 18 20 20   Temp: (!) 97.5 F (36.4 C) 98.4 F (36.9 C) 98 F (36.7 C) 98.1 F (36.7 C)  TempSrc: Oral Oral Oral Oral  SpO2: 98% 97% 98% 96%  Weight:      Height:        General: Pt is alert, awake, not in acute distress Cardiovascular:  S1/S2 +, no rubs, no  gallops Respiratory: CTA bilaterally, no wheezing, no rhonchi Abdominal: Soft, NT, ND, bowel sounds + Extremities: no edema, no cyanosis    The results of significant diagnostics from this hospitalization (including imaging, microbiology, ancillary and laboratory) are listed below for reference.     Microbiology: Recent Results (from the past 240 hour(s))  Urine culture     Status: None   Collection Time: 09/06/19  6:55 PM   Specimen: Urine, Random  Result Value Ref Range Status   Specimen Description URINE, RANDOM  Final   Special Requests   Final    NONE Performed at [facility], [address] [place], [place] [address]    Culture NO GROWTH  Final   Report Status 09/08/2019 FINAL  Final  SARS CORONAVIRUS 2 (TAT 6-24 HRS) Nasopharyngeal Nasopharyngeal Swab     Status: None   Collection Time: 09/06/19  9:20 PM   Specimen: Nasopharyngeal  Swab  Result Value Ref Range Status   SARS Coronavirus 2 NEGATIVE NEGATIVE Final    Comment: (NOTE) SARS-CoV-2 target nucleic acids are NOT DETECTED. The SARS-CoV-2 RNA is generally detectable in upper and lower respiratory specimens during the acute phase of infection. Negative results do not preclude SARS-CoV-2 infection, do not rule out co-infections with other pathogens, and should not be used as the sole basis for treatment or other patient management decisions. Negative results must be combined with clinical observations, patient history, and epidemiological information. The expected result is Negative. Fact Sheet for Patients: SugarRoll.be Fact Sheet for Healthcare Providers: https://www.woods-mathews.com/ This test is not yet approved or cleared by the [place] FDA and  has been authorized for detection and/or diagnosis of SARS-CoV-2 by FDA under an Emergency Use Authorization (EUA). This EUA will remain  in effect (meaning this test can be used) for the duration of  the COVID-19 declaration under Section 56 4(b)(1) of the Act, 21 U.S.C. section 360bbb-3(b)(1), unless the authorization is terminated or revoked sooner. Performed at [facility], [place] [address], [place], [place] [address]      Labs: BNP (last 3 results) No results for input(s): BNP in the last 8760 hours. Basic Metabolic Panel: Recent Labs  Lab 09/06/19 1805 09/07/19 0615 09/09/19 0614 09/10/19 0821  NA 139 141 139 139  K 3.5 3.8 4.0 4.2  CL 105 109 105 103  CO2 21* 25 26 26   GLUCOSE 95 104* 153* 201*  BUN 9 6 12 14   CREATININE 0.85 0.84 0.79 0.86  CALCIUM 9.3 8.4* 8.8* 9.1   Liver Function Tests: Recent Labs  Lab 09/06/19 1805  AST 20  ALT 18  ALKPHOS 56  BILITOT 0.7  PROT 7.5  ALBUMIN 4.3   Recent Labs  Lab 09/06/19 1805  LIPASE 22   No results for input(s): AMMONIA in the last 168 hours. CBC: Recent Labs  Lab 09/06/19 1805 09/07/19 0615 09/10/19 0821  WBC 19.9* 11.4* 10.1  HGB 14.0 12.2* 14.3  HCT 41.5 36.9* 43.2  MCV 91.4 92.0 91.3  PLT 428* 318 331   Cardiac Enzymes: No results for input(s): CKTOTAL, CKMB, CKMBINDEX, TROPONINI in the last 168 hours. BNP: Invalid input(s): POCBNP CBG: Recent Labs  Lab 09/09/19 0750 09/09/19 1209 09/09/19 1648 09/09/19 2213 09/10/19 0756  GLUCAP 151* 110* 163* 207* 165*   D-Dimer No results for input(s): DDIMER in the last 72 hours. Hgb A1c No results for input(s): HGBA1C in the last 72 hours. Lipid Profile No results for input(s): CHOL, HDL, LDLCALC, TRIG, CHOLHDL, LDLDIRECT in the last 72 hours. Thyroid function studies No results for input(s): TSH, T4TOTAL, T3FREE, THYROIDAB in the last 72 hours.  Invalid input(s): FREET3 Anemia work up No results for input(s): VITAMINB12, FOLATE, FERRITIN, TIBC, IRON, RETICCTPCT in the last 72 hours. Urinalysis    Component Value Date/Time   COLORURINE YELLOW (A) 09/06/2019 1855   APPEARANCEUR CLEAR (A) 09/06/2019 1855   APPEARANCEUR Clear  02/02/2017 1320   LABSPEC 1.006 09/06/2019 1855   PHURINE 6.0 09/06/2019 1855   GLUCOSEU NEGATIVE 09/06/2019 1855   HGBUR NEGATIVE 09/06/2019 1855   BILIRUBINUR NEGATIVE 09/06/2019 1855   BILIRUBINUR Negative 02/02/2017 1320   KETONESUR NEGATIVE 09/06/2019 1855   PROTEINUR NEGATIVE 09/06/2019 1855   NITRITE NEGATIVE 09/06/2019 1855   LEUKOCYTESUR NEGATIVE 09/06/2019 1855   Sepsis Labs Invalid input(s): PROCALCITONIN,  WBC,  LACTICIDVEN Microbiology Recent Results (from the past 240 hour(s))  Urine culture     Status: None  Collection Time: 09/06/19  6:55 PM   Specimen: Urine, Random  Result Value Ref Range Status   Specimen Description URINE, RANDOM  Final   Special Requests   Final    NONE Performed at [facility], [address] [place], [place] [address]    Culture NO GROWTH  Final   Report Status 09/08/2019 FINAL  Final  SARS CORONAVIRUS 2 (TAT 6-24 HRS) Nasopharyngeal Nasopharyngeal Swab     Status: None   Collection Time: 09/06/19  9:20 PM   Specimen: Nasopharyngeal Swab  Result Value Ref Range Status   SARS Coronavirus 2 NEGATIVE NEGATIVE Final    Comment: (NOTE) SARS-CoV-2 target nucleic acids are NOT DETECTED. The SARS-CoV-2 RNA is generally detectable in upper and lower respiratory specimens during the acute phase of infection. Negative results do not preclude SARS-CoV-2 infection, do not rule out co-infections with other pathogens, and should not be used as the sole basis for treatment or other patient management decisions. Negative results must be combined with clinical observations, patient history, and epidemiological information. The expected result is Negative. Fact Sheet for Patients: SugarRoll.be Fact Sheet for Healthcare Providers: https://www.woods-mathews.com/ This test is not yet approved or cleared by the [place] FDA and  has been authorized for detection and/or diagnosis of SARS-CoV-2  by FDA under an Emergency Use Authorization (EUA). This EUA will remain  in effect (meaning this test can be used) for the duration of the COVID-19 declaration under Section 56 4(b)(1) of the Act, 21 U.S.C. section 360bbb-3(b)(1), unless the authorization is terminated or revoked sooner. Performed at [facility], [place] [address], [place], [place] [address]      Time coordinating discharge: Over 30 minutes  SIGNED:   Wyvonnia Dusky, MD  [facility] 09/10/2019, 1:40 PM Pager   If 7PM-7AM, please contact night-coverage [email or web address]

## 2019-09-29 ENCOUNTER — Ambulatory Visit: Payer: BC Managed Care – PPO | Admitting: Hematology and Oncology

## 2019-09-30 ENCOUNTER — Inpatient Hospital Stay: Payer: BC Managed Care – PPO | Attending: Internal Medicine | Admitting: Internal Medicine

## 2019-09-30 ENCOUNTER — Other Ambulatory Visit: Payer: Self-pay

## 2019-09-30 DIAGNOSIS — Z87891 Personal history of nicotine dependence: Secondary | ICD-10-CM | POA: Insufficient documentation

## 2019-09-30 DIAGNOSIS — R1030 Lower abdominal pain, unspecified: Secondary | ICD-10-CM | POA: Insufficient documentation

## 2019-09-30 DIAGNOSIS — R82998 Other abnormal findings in urine: Secondary | ICD-10-CM | POA: Diagnosis present

## 2019-09-30 DIAGNOSIS — Z79899 Other long term (current) drug therapy: Secondary | ICD-10-CM | POA: Diagnosis not present

## 2019-09-30 DIAGNOSIS — R634 Abnormal weight loss: Secondary | ICD-10-CM | POA: Diagnosis not present

## 2019-09-30 DIAGNOSIS — J45909 Unspecified asthma, uncomplicated: Secondary | ICD-10-CM | POA: Insufficient documentation

## 2019-09-30 DIAGNOSIS — Z791 Long term (current) use of non-steroidal anti-inflammatories (NSAID): Secondary | ICD-10-CM | POA: Diagnosis not present

## 2019-09-30 DIAGNOSIS — G8929 Other chronic pain: Secondary | ICD-10-CM | POA: Insufficient documentation

## 2019-09-30 DIAGNOSIS — R5381 Other malaise: Secondary | ICD-10-CM | POA: Diagnosis not present

## 2019-09-30 DIAGNOSIS — K589 Irritable bowel syndrome without diarrhea: Secondary | ICD-10-CM | POA: Insufficient documentation

## 2019-09-30 DIAGNOSIS — Z7984 Long term (current) use of oral hypoglycemic drugs: Secondary | ICD-10-CM | POA: Diagnosis not present

## 2019-09-30 DIAGNOSIS — R300 Dysuria: Secondary | ICD-10-CM | POA: Insufficient documentation

## 2019-09-30 DIAGNOSIS — R319 Hematuria, unspecified: Secondary | ICD-10-CM | POA: Diagnosis not present

## 2019-09-30 DIAGNOSIS — N400 Enlarged prostate without lower urinary tract symptoms: Secondary | ICD-10-CM | POA: Insufficient documentation

## 2019-09-30 DIAGNOSIS — Z87442 Personal history of urinary calculi: Secondary | ICD-10-CM | POA: Diagnosis not present

## 2019-09-30 DIAGNOSIS — R110 Nausea: Secondary | ICD-10-CM | POA: Insufficient documentation

## 2019-09-30 DIAGNOSIS — E1165 Type 2 diabetes mellitus with hyperglycemia: Secondary | ICD-10-CM | POA: Diagnosis not present

## 2019-09-30 DIAGNOSIS — M549 Dorsalgia, unspecified: Secondary | ICD-10-CM | POA: Diagnosis not present

## 2019-09-30 DIAGNOSIS — I700 Atherosclerosis of aorta: Secondary | ICD-10-CM | POA: Diagnosis not present

## 2019-09-30 DIAGNOSIS — R8289 Other abnormal findings on cytological and histological examination of urine: Secondary | ICD-10-CM

## 2019-09-30 NOTE — Assessment & Plan Note (Addendum)
#  Abnormal urine cytology-atypical suspicious cells for high-grade malignancy; based on urine test/cystoscopy at the time of stent removal [Jan 2021].  Repeat cystoscopy/urology evaluation-at [facility] February 2021-negative for malignancy.  February 2021 CT scan shows no evidence of malignancy.   #Discussed with patient that it is quite possible to have inflammatory cells mis-characterized as atypical suspicious cells.  However repeat cytology/cystoscopy seem to be reassuring.  Moreover patient's symptoms not likely related to subtle bladder findings.  Patient and wife still-very concerned regarding discrepant findings.  We will make a referral to urology locally for further evaluation and recommendations.   #Diabetes-poorly controlled-hemoglobin A1c 9.5.  Recommend follow-up with PCP regarding management.  Thank you Dr.George for allowing me to participate in the care of your pleasant patient. Please do not hesitate to contact me with questions or concerns in the interim.  # DISPOSITION: # Referral to [facility]- re: hx of atypical cells suspicous for high grade malignancy # follow up TBD/if abnormality noted with urology.- Dr.B

## 2019-09-30 NOTE — Progress Notes (Signed)
[place] CONSULT NOTE  Patient Care Team: [facility] as PCP - General  CHIEF COMPLAINTS/PURPOSE OF CONSULTATION: Bladder cancer  # February 2021-[facility] urology-cystoscopy negative; no malignant cells  #Poorly controlled diabetes-hemoglobin A1c February 2021 9.5  Oncology History   No history exists.     HISTORY OF PRESENTING ILLNESS:  Charles Beasley 44 y.o.  male with a history of poorly controlled diabetes has been referred to [place] for "bladder cancer".  Patient's urologic issues going on for years as per patient wife.  Patient noted to have left UVJ stone left bladder wall thickening in September 2020 on a CT scan; further evaluated at [facility] lithotripsy stenting stone removal.  January 2021-patient noted to have atypical cytology[JAN 2021- [facility]- SUSPICIOUS FOR HIGH GRADE UROTHELIAL CARCINOMA]/erythema around the trigone at the time of stone removal.   Patient was most recently February 2021 evaluated by [facility] urology-cystoscopy negative; bladder cytology negative.  Patient had more recent CT scans-February 2021-showed possible diverticulitis/colitis-needing hospitalization/IV antibiotics.  States her colonoscopy year ago.  Patient notes to have multiple complaints including lower abdominal pain.  Nausea.  Chronic back pain.  Worsening dysuria.  Intermittent blood in urine.  Reviewed the records at length-office note/labs from [facility] urology- summarized above.    Review of Systems  Constitutional: Positive for malaise/fatigue and weight loss. Negative for chills, diaphoresis and fever.  HENT: Negative for nosebleeds and sore throat.   Eyes: Negative for double vision.  Respiratory: Negative for cough, hemoptysis, sputum production, shortness of breath and wheezing.   Cardiovascular: Negative for chest pain, palpitations, orthopnea and leg swelling.  Gastrointestinal: Positive for abdominal pain and nausea. Negative for blood in stool, constipation,  diarrhea, heartburn, melena and vomiting.  Genitourinary: Negative for dysuria, frequency and urgency.  Musculoskeletal: Positive for back pain. Negative for joint pain.  Skin: Negative.  Negative for itching and rash.  Neurological: Negative for dizziness, tingling, focal weakness, weakness and headaches.  Endo/Heme/Allergies: Does not bruise/bleed easily.  Psychiatric/Behavioral: Negative for depression. The patient is not nervous/anxious and does not have insomnia.      MEDICAL HISTORY:  Past Medical History:  Diagnosis Date  . Asthma   . Bladder cancer ([place])   . Diabetes mellitus without complication ([place])   . Diverticulosis   . History of kidney stones   . IBS (irritable bowel syndrome)     SURGICAL HISTORY: No past surgical history on file.  SOCIAL HISTORY: Social History   Socioeconomic History  . Marital status: Married    Spouse name: Not on file  . Number of children: Not on file  . Years of education: Not on file  . Highest education level: Not on file  Occupational History  . Not on file  Tobacco Use  . Smoking status: Former Smoker    Types: Cigarettes  . Smokeless tobacco: Never Used  Substance and Sexual Activity  . Alcohol use: No    Alcohol/week: 0.0 standard drinks  . Drug use: No  . Sexual activity: Not on file  Other Topics Concern  . Not on file  Social History Narrative  . Not on file   Social Determinants of Health   Financial Resource Strain:   . Difficulty of Paying Living Expenses: Not on file  Food Insecurity:   . Worried About Charity fundraiser in the Last Year: Not on file  . Ran Out of Food in the Last Year: Not on file  Transportation Needs:   . Lack of Transportation (  Medical): Not on file  . Lack of Transportation (Non-Medical): Not on file  Physical Activity:   . Days of Exercise per Week: Not on file  . Minutes of Exercise per Session: Not on file  Stress:   . Feeling of Stress : Not on file  Social Connections:   .  Frequency of Communication with Friends and Family: Not on file  . Frequency of Social Gatherings with Friends and Family: Not on file  . Attends Religious Services: Not on file  . Active Member of Clubs or Organizations: Not on file  . Attends Archivist Meetings: Not on file  . Marital Status: Not on file  Intimate Partner Violence:   . Fear of Current or Ex-Partner: Not on file  . Emotionally Abused: Not on file  . Physically Abused: Not on file  . Sexually Abused: Not on file    FAMILY HISTORY: Family History  Problem Relation Age of Onset  . Breast cancer Mother   . Hypertension Father   . Prostate cancer Neg Hx   . Bladder Cancer Neg Hx   . Kidney cancer Neg Hx     ALLERGIES:  is allergic to montelukast and sertraline.  MEDICATIONS:  Current Outpatient Medications  Medication Sig Dispense Refill  . acetaminophen (TYLENOL) 325 MG tablet Take 2 tablets (650 mg total) by mouth every 6 (six) hours as needed for mild pain or fever (or Fever >/= 101).    Marland Kitchen albuterol (PROVENTIL HFA;VENTOLIN HFA) 108 (90 BASE) MCG/ACT inhaler Inhale 2 puffs into the lungs every 6 (six) hours as needed. Reported on 11/08/2015    . busPIRone (BUSPAR) 7.5 MG tablet Take 7.5 mg by mouth 2 (two) times daily.    Marland Kitchen glipiZIDE (GLUCOTROL XL) 5 MG 24 hr tablet Take 5 mg by mouth daily.    . meloxicam (MOBIC) 7.5 MG tablet Take 7.5 mg by mouth daily.     . metFORMIN (GLUCOPHAGE) 1000 MG tablet Take 1,000 mg by mouth 2 (two) times daily.    Marland Kitchen omeprazole (PRILOSEC) 20 MG capsule Take 20 mg by mouth 2 (two) times daily.    Marland Kitchen oxybutynin (DITROPAN-XL) 10 MG 24 hr tablet Take 10 mg by mouth daily.    . simvastatin (ZOCOR) 20 MG tablet Take 20 mg by mouth at bedtime.    . fexofenadine (ALLEGRA) 180 MG tablet Take 180 mg by mouth daily as needed for allergies.     . promethazine (PHENERGAN) 25 MG tablet Take 25 mg by mouth every 6 (six) hours as needed for nausea.     No current facility-administered  medications for this visit.      Marland Kitchen  PHYSICAL EXAMINATION: ECOG PERFORMANCE STATUS: 1 - Symptomatic but completely ambulatory  Vitals:   09/30/19 1443  BP: 122/81  Pulse: 96  Resp: 18  Temp: 97.7 F (36.5 C)  SpO2: 100%   Filed Weights   09/30/19 1443  Weight: 155 lb 12.8 oz (70.7 kg)    Physical Exam  Constitutional: He is oriented to person, place, and time and well-developed, well-nourished, and in no distress.  HENT:  Head: Normocephalic and atraumatic.  Mouth/Throat: Oropharynx is clear and moist. No oropharyngeal exudate.  Eyes: Pupils are equal, round, and reactive to light.  Cardiovascular: Normal rate and regular rhythm.  Pulmonary/Chest: Effort normal and breath sounds normal. No respiratory distress. He has no wheezes.  Abdominal: Soft. Bowel sounds are normal. He exhibits no distension and no mass. There is no abdominal tenderness. There  is no rebound and no guarding.  Musculoskeletal:        General: No tenderness or edema. Normal range of motion.     Cervical back: Normal range of motion and neck supple.  Neurological: He is alert and oriented to person, place, and time.  Skin: Skin is warm.  Psychiatric: Affect normal.     LABORATORY DATA:  I have reviewed the data as listed Lab Results  Component Value Date   WBC 10.1 09/10/2019   HGB 14.3 09/10/2019   HCT 43.2 09/10/2019   MCV 91.3 09/10/2019   PLT 331 09/10/2019   Recent Labs    01/28/19 0355 01/28/19 0355 07/04/19 1955 07/04/19 1955 09/06/19 1805 09/06/19 1805 09/07/19 0615 09/09/19 0614 09/10/19 0821  NA 138   < > 139   < > 139   < > 141 139 139  K 3.5   < > 3.9   < > 3.5   < > 3.8 4.0 4.2  CL 104   < > 105   < > 105   < > 109 105 103  CO2 20*   < > 23   < > 21*   < > 25 26 26   GLUCOSE 240*   < > 244*   < > 95   < > 104* 153* 201*  BUN 20   < > 11   < > 9   < > 6 12 14   CREATININE 1.24   < > 0.72   < > 0.85   < > 0.84 0.79 0.86  CALCIUM 9.7   < > 9.3   < > 9.3   < > 8.4* 8.8* 9.1   GFRNONAA >60   < > >60   < > >60   < > >60 >60 >60  GFRAA >60   < > >60   < > >60   < > >60 >60 >60  PROT 8.0  --  7.9  --  7.5  --   --   --   --   ALBUMIN 4.6  --  4.6  --  4.3  --   --   --   --   AST 28  --  16  --  20  --   --   --   --   ALT 15  --  13  --  18  --   --   --   --   ALKPHOS 74  --  70  --  56  --   --   --   --   BILITOT 0.7  --  0.3  --  0.7  --   --   --   --    < > = values in this interval not displayed.    RADIOGRAPHIC STUDIES: I have personally reviewed the radiological images as listed and agreed with the findings in the report. CT Head Wo Contrast  Result Date: 09/06/2019 CLINICAL DATA:  Shakiness, history of bladder cancer EXAM: CT HEAD WITHOUT CONTRAST TECHNIQUE: Contiguous axial images were obtained from the base of the skull through the vertex without intravenous contrast. COMPARISON:  None. FINDINGS: Brain: No evidence of acute infarction, hemorrhage, hydrocephalus, extra-axial collection or mass lesion/mass effect. Vascular: No hyperdense vessel or unexpected calcification. Skull: Normal. Negative for fracture or focal lesion. Sinuses/Orbits: No acute finding. Other: None. IMPRESSION: No acute intracranial pathology. Electronically Signed   By: Eddie Candle M.D.   On: 09/06/2019 20:02   CT  Abdomen Pelvis W Contrast  Result Date: 09/06/2019 CLINICAL DATA:  Lower abdominal pain, leukocytosis, history of bladder cancer EXAM: CT ABDOMEN AND PELVIS WITH CONTRAST TECHNIQUE: Multidetector CT imaging of the abdomen and pelvis was performed using the standard protocol following bolus administration of intravenous contrast. CONTRAST:  127mL OMNIPAQUE IOHEXOL 300 MG/ML  SOLN COMPARISON:  07/05/2019 FINDINGS: Lower chest: No acute abnormality. Hepatobiliary: No solid liver abnormality is seen. Subcentimeter cyst of the anterior right lobe of the liver (series 2, image 11). No gallstones, gallbladder wall thickening, or biliary dilatation. Pancreas: Unremarkable. No  pancreatic ductal dilatation or surrounding inflammatory changes. Spleen: Normal in size without significant abnormality. Adrenals/Urinary Tract: Adrenal glands are unremarkable. Tiny nonobstructive calculus of the inferior pole of the left kidney. Bladder is unremarkable. Stomach/Bowel: Stomach is within normal limits. Appendix appears normal. No evidence of bowel wall thickening, distention, or inflammatory changes. Occasional descending and sigmoid diverticula. There is decompression and some thickening and adjacent fat stranding about the descending colon (series 5, image 43). Vascular/Lymphatic: Aortic atherosclerosis. No enlarged abdominal or pelvic lymph nodes. Reproductive: Mild prostatomegaly. Other: No abdominal wall hernia or abnormality. No abdominopelvic ascites. Musculoskeletal: No acute or significant osseous findings. IMPRESSION: 1. Some thickening and adjacent fat stranding about the descending colon, suggestive of nonspecific infectious, inflammatory, or ischemic colitis. Occasional descending and sigmoid diverticula which do not appear particularly involved to specifically suggest diverticulitis. 2. No evidence of bladder mass, lymphadenopathy, or metastatic disease in the abdomen or pelvis. 3. Nonobstructive left nephrolithiasis.  No hydronephrosis. 4. Mild prostatomegaly. 5. Aortic Atherosclerosis (ICD10-I70.0). Electronically Signed   By: Eddie Candle M.D.   On: 09/06/2019 20:01    ASSESSMENT & PLAN:   Abnormal urine cytology #Abnormal urine cytology-atypical suspicious cells for high-grade malignancy; based on urine test/cystoscopy at the time of stent removal [Jan 2021].  Repeat cystoscopy/urology evaluation-at [facility] February 2021-negative for malignancy.  February 2021 CT scan shows no evidence of malignancy.   #Discussed with patient that it is quite possible to have inflammatory cells mis-characterized as atypical suspicious cells.  However repeat cytology/cystoscopy seem to be  reassuring.  Moreover patient's symptoms not likely related to subtle bladder findings.  Patient and wife still-very concerned regarding discrepant findings.  We will make a referral to urology locally for further evaluation and recommendations.   #Diabetes-poorly controlled-hemoglobin A1c 9.5.  Recommend follow-up with PCP regarding management.  Thank you Dr.George for allowing me to participate in the care of your pleasant patient. Please do not hesitate to contact me with questions or concerns in the interim.  # DISPOSITION: # Referral to [facility]- re: hx of atypical cells suspicous for high grade malignancy # follow up TBD/if abnormality noted with urology.- Dr.B  All questions were answered. The patient knows to call the clinic with any problems, questions or concerns.   Cammie Sickle, MD 10/01/2019 8:14 AM

## 2019-10-12 ENCOUNTER — Other Ambulatory Visit: Payer: Self-pay

## 2019-10-12 ENCOUNTER — Emergency Department
Admission: EM | Admit: 2019-10-12 | Discharge: 2019-10-12 | Disposition: A | Discharge status: Home / Self Care | Payer: BC Managed Care – PPO | Attending: Student | Admitting: Student

## 2019-10-12 ENCOUNTER — Emergency Department: Payer: BC Managed Care – PPO

## 2019-10-12 ENCOUNTER — Encounter: Payer: Self-pay | Admitting: Emergency Medicine

## 2019-10-12 DIAGNOSIS — Z8551 Personal history of malignant neoplasm of bladder: Secondary | ICD-10-CM | POA: Diagnosis not present

## 2019-10-12 DIAGNOSIS — E119 Type 2 diabetes mellitus without complications: Secondary | ICD-10-CM | POA: Insufficient documentation

## 2019-10-12 DIAGNOSIS — R1031 Right lower quadrant pain: Secondary | ICD-10-CM | POA: Diagnosis present

## 2019-10-12 DIAGNOSIS — Z79899 Other long term (current) drug therapy: Secondary | ICD-10-CM | POA: Insufficient documentation

## 2019-10-12 DIAGNOSIS — J45909 Unspecified asthma, uncomplicated: Secondary | ICD-10-CM | POA: Diagnosis not present

## 2019-10-12 DIAGNOSIS — Z87891 Personal history of nicotine dependence: Secondary | ICD-10-CM | POA: Insufficient documentation

## 2019-10-12 DIAGNOSIS — Z7984 Long term (current) use of oral hypoglycemic drugs: Secondary | ICD-10-CM | POA: Insufficient documentation

## 2019-10-12 LAB — URINALYSIS, COMPLETE (UACMP) WITH MICROSCOPIC
Bacteria, UA: NONE SEEN
Bilirubin Urine: NEGATIVE
Glucose, UA: NEGATIVE mg/dL
Hgb urine dipstick: NEGATIVE
Ketones, ur: 5 mg/dL — AB
Leukocytes,Ua: NEGATIVE
Nitrite: NEGATIVE
Protein, ur: NEGATIVE mg/dL
Specific Gravity, Urine: 1.023 (ref 1.005–1.030)
Squamous Epithelial / HPF: NONE SEEN (ref 0–5)
pH: 5 (ref 5.0–8.0)

## 2019-10-12 LAB — CBC
HCT: 41.4 % (ref 39.0–52.0)
Hemoglobin: 13.8 g/dL (ref 13.0–17.0)
MCH: 30.5 pg (ref 26.0–34.0)
MCHC: 33.3 g/dL (ref 30.0–36.0)
MCV: 91.6 fL (ref 80.0–100.0)
Platelets: 377 10*3/uL (ref 150–400)
RBC: 4.52 MIL/uL (ref 4.22–5.81)
RDW: 13.2 % (ref 11.5–15.5)
WBC: 13.6 10*3/uL — ABNORMAL HIGH (ref 4.0–10.5)
nRBC: 0 % (ref 0.0–0.2)

## 2019-10-12 LAB — BASIC METABOLIC PANEL
Anion gap: 11 (ref 5–15)
BUN: 19 mg/dL (ref 6–20)
CO2: 21 mmol/L — ABNORMAL LOW (ref 22–32)
Calcium: 9 mg/dL (ref 8.9–10.3)
Chloride: 103 mmol/L (ref 98–111)
Creatinine, Ser: 0.75 mg/dL (ref 0.61–1.24)
GFR calc Af Amer: >60 mL/min (ref >60)
GFR calc non Af Amer: >60 mL/min (ref >60)
Glucose, Bld: 213 mg/dL — ABNORMAL HIGH (ref 70–99)
Potassium: 4.1 mmol/L (ref 3.5–5.1)
Sodium: 135 mmol/L (ref 135–145)

## 2019-10-12 LAB — GLUCOSE, CAPILLARY: Glucose-Capillary: 70 mg/dL (ref 70–99)

## 2019-10-12 MED ORDER — MORPHINE SULFATE (PF) 4 MG/ML IV SOLN
4.0000 mg | Freq: Once | INTRAVENOUS | Status: AC
  Administered 2019-10-12: 4 mg via INTRAVENOUS
  Filled 2019-10-12: qty 1

## 2019-10-12 MED ORDER — ONDANSETRON HCL 4 MG/2ML IJ SOLN
4.0000 mg | Freq: Once | INTRAMUSCULAR | Status: AC
  Administered 2019-10-12: 4 mg via INTRAVENOUS
  Filled 2019-10-12: qty 2

## 2019-10-12 MED ORDER — SODIUM CHLORIDE 0.9 % IV BOLUS
1000.0000 mL | Freq: Once | INTRAVENOUS | Status: AC
  Administered 2019-10-12: 1000 mL via INTRAVENOUS

## 2019-10-12 MED ORDER — IBUPROFEN 600 MG PO TABS: 600.0000 mg | ORAL_TABLET | Freq: Four times a day (QID) | ORAL | 0 refills | Status: AC | PRN

## 2019-10-12 MED ORDER — IOHEXOL 300 MG/ML  SOLN
100.0000 mL | Freq: Once | INTRAMUSCULAR | Status: AC | PRN
  Administered 2019-10-12: 100 mL via INTRAVENOUS

## 2019-10-12 NOTE — Discharge Instructions (Addendum)
Thank you for letting us take care of you in the emergency department today.  ° °Please continue to take any regular, prescribed medications.  ° °Please follow up with: °- Your primary care doctor to review your ER visit and follow up on your symptoms.  ° °Please return to the ER for any new or worsening symptoms.  ° °

## 2019-10-12 NOTE — ED Notes (Signed)
Snack provided per pt request

## 2019-10-12 NOTE — ED Provider Notes (Signed)
[facility] Emergency Department Provider Note  ____________________________________________   First MD Initiated Contact with Patient 10/12/19 1820     (approximate)  I have reviewed the triage vital signs and the nursing notes.  History  Chief Complaint Flank Pain    HPI Charles Beasley is a 44 y.o. male with hx of colitis, nephrolithiasis (requiring stenting in past), IBS, atypical urothelial cytology concerning for urothelial carcinoma (currently being worked up), DM who presents to the ED for right lower quadrant and right flank pain.  Patient states pain has been ongoing for 1 week, constant since onset, worsening in severity last night prompting him to seek care.  Describes as an aching type sensation, 10/10 in severity, located to the right flank and radiates to the right lower quadrant area. No alleviating or aggravating components. Associated with nausea and 2 episodes of vomiting today.  Reports some urinary frequency.  No fevers or chills. No testicular pain or swelling.  Symptoms feel similar to prior episode of colitis.  Per Care Everywhere he was seen at an outside hospital ED for similar symptoms on 3/10, CT abdomen/pelvis with no acute findings.   Past Medical Hx Past Medical History:  Diagnosis Date  . Asthma   . Bladder cancer ([place])   . Diabetes mellitus without complication ([place])   . Diverticulosis   . History of kidney stones   . IBS (irritable bowel syndrome)     Problem List Patient Active Problem List   Diagnosis Date Noted  . Abnormal urine cytology 09/30/2019  . Colitis 09/06/2019  . Diabetes mellitus without complication ([place])   . Asthma     Past Surgical Hx History reviewed. No pertinent surgical history.  Medications Prior to Admission medications   Medication Sig Start Date End Date Taking? Authorizing Provider  acetaminophen (TYLENOL) 325 MG tablet Take 2 tablets (650 mg total) by mouth every 6 (six) hours as needed  for mild pain or fever (or Fever >/= 101). 09/10/19   Wyvonnia Dusky, MD  albuterol (PROVENTIL HFA;VENTOLIN HFA) 108 (90 BASE) MCG/ACT inhaler Inhale 2 puffs into the lungs every 6 (six) hours as needed. Reported on 11/08/2015    [provider]  busPIRone (BUSPAR) 7.5 MG tablet Take 7.5 mg by mouth 2 (two) times daily. 08/29/19   [provider]  fexofenadine (ALLEGRA) 180 MG tablet Take 180 mg by mouth daily as needed for allergies.     [provider]  glipiZIDE (GLUCOTROL XL) 5 MG 24 hr tablet Take 5 mg by mouth daily. 09/02/19   [provider]  meloxicam (MOBIC) 7.5 MG tablet Take 7.5 mg by mouth daily.     [provider]  metFORMIN (GLUCOPHAGE) 1000 MG tablet Take 1,000 mg by mouth 2 (two) times daily. 08/19/19   [provider]  omeprazole (PRILOSEC) 20 MG capsule Take 20 mg by mouth 2 (two) times daily. 08/25/19   [provider]  oxybutynin (DITROPAN-XL) 10 MG 24 hr tablet Take 10 mg by mouth daily. 08/25/19   [provider]  promethazine (PHENERGAN) 25 MG tablet Take 25 mg by mouth every 6 (six) hours as needed for nausea. 09/02/19 09/09/19  [provider]  simvastatin (ZOCOR) 20 MG tablet Take 20 mg by mouth at bedtime. 08/16/19   [provider]    Allergies Montelukast and Sertraline  Family Hx Family History  Problem Relation Age of Onset  . Breast cancer Mother   . Hypertension Father   .  Prostate cancer Neg Hx   . Bladder Cancer Neg Hx   . Kidney cancer Neg Hx     Social Hx Social History   Tobacco Use  . Smoking status: Former Smoker    Types: Cigarettes  . Smokeless tobacco: Never Used  Substance Use Topics  . Alcohol use: No    Alcohol/week: 0.0 standard drinks  . Drug use: No     Review of Systems  Constitutional: Negative for fever, chills. Eyes: Negative for visual changes. ENT: Negative for sore throat. Cardiovascular: Negative for chest pain. Respiratory:  Negative for shortness of breath. Gastrointestinal: Positive for abdominal pain, nausea, vomiting. Genitourinary: Negative for dysuria. Musculoskeletal: Negative for leg swelling. Skin: Negative for rash. Neurological: Negative for headaches.   Physical Exam  Vital Signs: ED Triage Vitals [10/12/19 1543]  Enc Vitals Group     BP 129/78     Pulse Rate 94     Resp 16     Temp 98.7 F (37.1 C)     Temp Source Oral     SpO2 98 %     Weight 152 lb (68.9 kg)     Height 5\' 9"  (1.753 m)     Head Circumference      Peak Flow      Pain Score 10     Pain Loc      Pain Edu?      Excl. in [place]?     Constitutional: Alert and oriented. Well appearing.  Head: Normocephalic. Atraumatic. Eyes: Conjunctivae clear, sclera anicteric. Pupils equal and symmetric. Nose: No masses or lesions. No congestion or rhinorrhea. Mouth/Throat: Wearing mask.  Neck: No stridor. Trachea midline.  Cardiovascular: Normal rate, regular rhythm. Extremities well perfused. Respiratory: Normal respiratory effort.  Lungs CTAB. Gastrointestinal: Soft.  TTP R flank and RLQ, no rebound or guarding.  Remainder of abdomen is soft non-tender.  No distention. Genitourinary: RN chaperone present.  No palpable hernias.  Testes of normal line.  No testicular swelling, erythema.  No discharge.  No rashes or lesions. Musculoskeletal: No lower extremity edema. No deformities. Neurologic:  Normal speech and language. No gross focal or lateralizing neurologic deficits are appreciated.  Skin: Skin is warm, dry and intact. No rash noted. Psychiatric: Mood and affect are appropriate for situation.  EKG  N/A    Radiology  CT abdomen/pelvis IMPRESSION:  1. Equivocal wall thickening of the sigmoid colon versus  nondistention. Previous pericolonic fat stranding of the descending  colon has resolved.  2. Otherwise no acute abnormality in the abdomen/pelvis.  3. Tiny nonobstructing stone in the left kidney.  4. Aortic  atherosclerosis, advanced for age.    Procedures  Procedure(s) performed (including critical care):  Procedures   Initial Impression / Assessment and Plan / MDM / ED Course  44 y.o. male who presents to the ED for ongoing right lower quadrant and right flank pain, as above  Ddx: nephroureterolithiasis, colitis, diverticulitis, MSK.  Normal GU exam.  No evidence of shingles on exam.  Will evaluate with labs, imaging, IV symptom control and reassess  CT imaging as above, equivocal wall thickening of sigmoid colon versus nondistention, otherwise no acute abnormalities.  Unclear if this is contributing to his symptoms today, as the majority of his pain is right-sided, as opposed to left.  Labs reveal very mild nonspecific WBC count 13.6.  No fevers.  UA negative for infection.  Remainder of labs without actionable derangements.  Patient feeling improved after fluids and symptom control.  Updated on  findings.  Able to tolerate PO in the ED.  Given negative work-up, feel patient is stable for discharge with outpatient follow-up.  Given Rx for ibuprofen as needed for further pain control.  Patient voices understanding is comfortable with the plan and discharge.  Given return precautions.   _______________________________  As part of my medical decision making I have reviewed available labs, radiology tests, reviewed old records.  Final Clinical Impression(s) / ED Diagnosis  Final diagnoses:  RLQ abdominal pain       Note:  This document was prepared using Dragon voice recognition software and may include unintentional dictation errors.   Lilia Pro., MD 10/12/19 2117

## 2019-10-12 NOTE — ED Triage Notes (Signed)
Patient presents to the ED with right flank pain x 1 week that became much worse last night.  Patient states he used a heating pad and oxycodone without relief.  Patient reports urinary frequency this week and a recent diagnosis of bladder cancer.  Patient also reports vomiting x 2 today.

## 2019-10-29 ENCOUNTER — Encounter: Payer: Self-pay | Admitting: Urology

## 2019-10-29 ENCOUNTER — Other Ambulatory Visit: Payer: Self-pay

## 2019-10-29 ENCOUNTER — Ambulatory Visit (INDEPENDENT_AMBULATORY_CARE_PROVIDER_SITE_OTHER): Payer: BC Managed Care – PPO | Admitting: Urology

## 2019-10-29 VITALS — BP 130/81 | HR 98 | Ht 69.0 in | Wt 153.0 lb

## 2019-10-29 DIAGNOSIS — R8289 Other abnormal findings on cytological and histological examination of urine: Secondary | ICD-10-CM | POA: Diagnosis not present

## 2019-10-29 DIAGNOSIS — R350 Frequency of micturition: Secondary | ICD-10-CM | POA: Diagnosis not present

## 2019-10-29 NOTE — Patient Instructions (Signed)

## 2019-10-29 NOTE — Progress Notes (Signed)
10/29/19 3:33 PM   Charles Beasley Charles Beasley [birth year] [id number]  CC: Abnormal urine cytology, abdominal pain  HPI: I saw Charles Beasley for evaluation of abnormal urine cytology and abdominal pain.  He is a 44 year old male with history of multiple ER visits for abdominal pain, as well as history of nephrolithiasis.  He has recently been seen by urology at both [facility] and [place].  To briefly summarize, he underwent left ureteroscopy and laser lithotripsy with stent placement at [facility] for a 5 mm left ureteral stone on 05/08/2019, and stent was not removed until 08/04/2019.  There was some erythema at time of stent removal.  He also had a CT with some bladder wall thickening.  He underwent a cystoscopy with Dr. Thurmond Butts at [facility] on 08/14/2019 which was essentially normal aside from some very mild erythema at the trigone.  Cytology was sent and was atypical.  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  He then was seen at [facility] on 09/12/2019 and underwent another cystoscopy that was reportedly completely normal with no erythema or abnormalities.  Cytology sent from that bladder wash was negative.  He most recently was seen in the ER on 10/12/2019 with abdominal pain and there is no evidence of hydronephrosis or ureteral stone at that time, and the bladder was grossly normal-appearing.  He denies any gross hematuria.  He has a 20-pack-year smoking history and quit 6 years ago.  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  PSA was normal in January 2021 and 0.5.  He has been taking oxybutynin for urinary frequency, but his primary complaint today is abdominal pain and constipation.  Urinalysis is benign today with 0-5 WBCs, 0 RBCs, no bacteria, nitrite negative, no leukocytes   PMH: Asthma Diabetes IBS Nephrolithiasis   Family History: Family History  Problem Relation Age of  Onset  . Breast cancer Mother   . Hypertension Father   . Prostate cancer Neg Hx   . Bladder Cancer Neg Hx   . Kidney cancer Neg Hx     Social History:  reports that he has quit smoking. His smoking use included cigarettes. He has never used smokeless tobacco. He reports that he does not drink alcohol or use drugs.  Physical Exam: BP 130/81   Pulse 98   Ht 5\' 9"  (1.753 m)   Wt 153 lb (69.4 kg)   BMI 22.59 kg/m    Constitutional:  Alert and oriented, No acute distress.  Laboratory Data: Reviewed, see HPI  Pertinent Imaging: Reviewed, see HPI  Assessment & Plan:   In summary, he is a 44 year old male with history of nephrolithiasis treated with ureteroscopy and stent placement at [facility] who had a stent in place for 3 months before being removed in January 2021.  He had an atypical cytology in early January 2021, followed by a suspicious cytology in mid January, then a negative repeat cytology in February 2021.  He has had 3 cystoscopies with no evidence of bladder tumors.  Most recent CT imaging with contrast shows a normal-appearing bladder.  We a long conversation about the false positive rate with cytology, and imperfections in terms of the detection of bladder cancer.  Certainly his most recent normal CT, and multiple normal cystoscopy's are all very reassuring that he does not have bladder cancer.  We discussed that without a tissue biopsy, I cannot guarantee him 100%  that he does not have cancer, however all of the data is very reassuring that his atypical and suspicious cytology were more likely related to having a prolonged ureteral stent in place.  Other options would include CT urogram or cystoscopy and bladder biopsy and/or diagnostic ureteroscopy, however I discouraged him from pursuing these invasive and expensive treatments in the setting of multiple prior CTs in the last 3 months, and multiple normal cystoscopies.  I provided extensive reassurance.  We discussed return  precautions at length including gross hematuria.  Regarding his urinary symptoms, had a long conversation with the patient about constipation and urinary symptoms, and I suspect his oxybutynin is contributing to his abdominal pain and constipation, and resulting in urinary frequency.  I recommended he stop oxybutynin and we discussed behavioral strategies regarding his urination.  Stop oxybutynin RTC 1 year with urinalysis prior  I spent 45 total minutes on the day of the encounter including pre-visit review of the medical record, face-to-face time with the patient, and post visit ordering of labs/imaging/tests.  Nickolas Madrid, MD 10/29/2019  [facility] [address], [place], [place] [address] [phone]

## 2019-10-30 LAB — MICROSCOPIC EXAMINATION
Bacteria, UA: NONE SEEN
RBC, Urine: NONE SEEN /hpf (ref 0–2)

## 2019-10-30 LAB — URINALYSIS, COMPLETE
Bilirubin, UA: NEGATIVE
Leukocytes,UA: NEGATIVE
Nitrite, UA: NEGATIVE
RBC, UA: NEGATIVE
Specific Gravity, UA: >1.03 — ABNORMAL HIGH (ref 1.005–1.030)
Urobilinogen, Ur: 0.2 mg/dL (ref 0.2–1.0)
pH, UA: 5.5 (ref 5.0–7.5)

## 2019-11-18 ENCOUNTER — Encounter: Payer: Self-pay | Admitting: Emergency Medicine

## 2019-11-18 ENCOUNTER — Other Ambulatory Visit: Payer: Self-pay

## 2019-11-18 ENCOUNTER — Emergency Department: Payer: BC Managed Care – PPO

## 2019-11-18 ENCOUNTER — Inpatient Hospital Stay
Admission: EM | Admit: 2019-11-18 | Discharge: 2019-11-21 | DRG: 694 | Disposition: A | Discharge status: Home / Self Care | Payer: BC Managed Care – PPO | Attending: Internal Medicine | Admitting: Internal Medicine

## 2019-11-18 DIAGNOSIS — Z8551 Personal history of malignant neoplasm of bladder: Secondary | ICD-10-CM

## 2019-11-18 DIAGNOSIS — E119 Type 2 diabetes mellitus without complications: Secondary | ICD-10-CM

## 2019-11-18 DIAGNOSIS — Z7984 Long term (current) use of oral hypoglycemic drugs: Secondary | ICD-10-CM

## 2019-11-18 DIAGNOSIS — J45909 Unspecified asthma, uncomplicated: Secondary | ICD-10-CM | POA: Diagnosis present

## 2019-11-18 DIAGNOSIS — Z87891 Personal history of nicotine dependence: Secondary | ICD-10-CM

## 2019-11-18 DIAGNOSIS — N390 Urinary tract infection, site not specified: Secondary | ICD-10-CM

## 2019-11-18 DIAGNOSIS — Z79899 Other long term (current) drug therapy: Secondary | ICD-10-CM

## 2019-11-18 DIAGNOSIS — R52 Pain, unspecified: Secondary | ICD-10-CM | POA: Diagnosis not present

## 2019-11-18 DIAGNOSIS — N200 Calculus of kidney: Secondary | ICD-10-CM

## 2019-11-18 DIAGNOSIS — Z791 Long term (current) use of non-steroidal anti-inflammatories (NSAID): Secondary | ICD-10-CM

## 2019-11-18 DIAGNOSIS — N202 Calculus of kidney with calculus of ureter: Principal | ICD-10-CM | POA: Diagnosis present

## 2019-11-18 DIAGNOSIS — Z803 Family history of malignant neoplasm of breast: Secondary | ICD-10-CM

## 2019-11-18 DIAGNOSIS — N23 Unspecified renal colic: Secondary | ICD-10-CM

## 2019-11-18 DIAGNOSIS — Z8719 Personal history of other diseases of the digestive system: Secondary | ICD-10-CM

## 2019-11-18 DIAGNOSIS — N400 Enlarged prostate without lower urinary tract symptoms: Secondary | ICD-10-CM | POA: Diagnosis present

## 2019-11-18 DIAGNOSIS — N201 Calculus of ureter: Secondary | ICD-10-CM

## 2019-11-18 DIAGNOSIS — R21 Rash and other nonspecific skin eruption: Secondary | ICD-10-CM | POA: Diagnosis present

## 2019-11-18 DIAGNOSIS — Z20822 Contact with and (suspected) exposure to covid-19: Secondary | ICD-10-CM | POA: Diagnosis present

## 2019-11-18 DIAGNOSIS — Z8249 Family history of ischemic heart disease and other diseases of the circulatory system: Secondary | ICD-10-CM

## 2019-11-18 HISTORY — DX: Noninfective gastroenteritis and colitis, unspecified: K52.9

## 2019-11-18 LAB — COMPREHENSIVE METABOLIC PANEL
ALT: 20 U/L (ref 0–44)
AST: 20 U/L (ref 15–41)
Albumin: 4.6 g/dL (ref 3.5–5.0)
Alkaline Phosphatase: 66 U/L (ref 38–126)
Anion gap: 10 (ref 5–15)
BUN: 15 mg/dL (ref 6–20)
CO2: 21 mmol/L — ABNORMAL LOW (ref 22–32)
Calcium: 9.9 mg/dL (ref 8.9–10.3)
Chloride: 107 mmol/L (ref 98–111)
Creatinine, Ser: 0.75 mg/dL (ref 0.61–1.24)
GFR calc Af Amer: >60 mL/min (ref >60)
GFR calc non Af Amer: >60 mL/min (ref >60)
Glucose, Bld: 161 mg/dL — ABNORMAL HIGH (ref 70–99)
Potassium: 4 mmol/L (ref 3.5–5.1)
Sodium: 138 mmol/L (ref 135–145)
Total Bilirubin: 0.6 mg/dL (ref 0.3–1.2)
Total Protein: 8 g/dL (ref 6.5–8.1)

## 2019-11-18 LAB — CBC
HCT: 41.5 % (ref 39.0–52.0)
Hemoglobin: 14.5 g/dL (ref 13.0–17.0)
MCH: 31.1 pg (ref 26.0–34.0)
MCHC: 34.9 g/dL (ref 30.0–36.0)
MCV: 89.1 fL (ref 80.0–100.0)
Platelets: 423 10*3/uL — ABNORMAL HIGH (ref 150–400)
RBC: 4.66 MIL/uL (ref 4.22–5.81)
RDW: 13.8 % (ref 11.5–15.5)
WBC: 14 10*3/uL — ABNORMAL HIGH (ref 4.0–10.5)
nRBC: 0 % (ref 0.0–0.2)

## 2019-11-18 LAB — URINALYSIS, COMPLETE (UACMP) WITH MICROSCOPIC
Bacteria, UA: NONE SEEN
Bilirubin Urine: NEGATIVE
Glucose, UA: 50 mg/dL — AB
Ketones, ur: NEGATIVE mg/dL
Leukocytes,Ua: NEGATIVE
Nitrite: NEGATIVE
Protein, ur: 100 mg/dL — AB
RBC / HPF: >50 RBC/hpf — ABNORMAL HIGH (ref 0–5)
Specific Gravity, Urine: 1.025 (ref 1.005–1.030)
Squamous Epithelial / HPF: NONE SEEN (ref 0–5)
pH: 7 (ref 5.0–8.0)

## 2019-11-18 LAB — LIPASE, BLOOD: Lipase: 26 U/L (ref 11–51)

## 2019-11-18 MED ORDER — INSULIN ASPART 100 UNIT/ML ~~LOC~~ SOLN
0.0000 [IU] | Freq: Three times a day (TID) | SUBCUTANEOUS | Status: DC
  Administered 2019-11-19 – 2019-11-20 (×5): 2 [IU] via SUBCUTANEOUS
  Filled 2019-11-18 (×5): qty 1

## 2019-11-18 MED ORDER — OXYCODONE HCL 5 MG PO TABS: 5.0000 mg | ORAL_TABLET | ORAL | Status: DC | PRN

## 2019-11-18 MED ORDER — TAMSULOSIN HCL 0.4 MG PO CAPS
0.4000 mg | ORAL_CAPSULE | Freq: Every day | ORAL | Status: DC
  Administered 2019-11-19 – 2019-11-21 (×3): 0.4 mg via ORAL
  Filled 2019-11-18 (×3): qty 1

## 2019-11-18 MED ORDER — ONDANSETRON HCL 4 MG/2ML IJ SOLN
4.0000 mg | Freq: Four times a day (QID) | INTRAMUSCULAR | Status: DC | PRN
  Administered 2019-11-19: 09:00:00 4 mg via INTRAVENOUS
  Filled 2019-11-18: qty 2

## 2019-11-18 MED ORDER — SENNOSIDES-DOCUSATE SODIUM 8.6-50 MG PO TABS: 1.0000 | ORAL_TABLET | Freq: Every evening | ORAL | Status: DC | PRN

## 2019-11-18 MED ORDER — KETOROLAC TROMETHAMINE 30 MG/ML IJ SOLN
30.0000 mg | Freq: Once | INTRAMUSCULAR | Status: AC
  Administered 2019-11-18: 19:00:00 30 mg via INTRAVENOUS
  Filled 2019-11-18: qty 1

## 2019-11-18 MED ORDER — LIDOCAINE HCL (CARDIAC) PF 100 MG/5ML IV SOSY
1.5000 mg/kg | PREFILLED_SYRINGE | Freq: Once | INTRAVENOUS | Status: DC
  Filled 2019-11-18: qty 10

## 2019-11-18 MED ORDER — MORPHINE SULFATE (PF) 2 MG/ML IV SOLN
INTRAVENOUS | Status: AC
  Filled 2019-11-18: qty 2

## 2019-11-18 MED ORDER — SODIUM CHLORIDE 0.9 % IV BOLUS
1000.0000 mL | Freq: Once | INTRAVENOUS | Status: AC
  Administered 2019-11-18: 19:00:00 1000 mL via INTRAVENOUS

## 2019-11-18 MED ORDER — MORPHINE SULFATE (PF) 4 MG/ML IV SOLN
4.0000 mg | Freq: Once | INTRAVENOUS | Status: AC
  Administered 2019-11-18: 17:00:00 4 mg via INTRAVENOUS
  Filled 2019-11-18: qty 1

## 2019-11-18 MED ORDER — HYDROMORPHONE HCL 1 MG/ML IJ SOLN
1.0000 mg | Freq: Once | INTRAMUSCULAR | Status: AC
  Administered 2019-11-18: 21:00:00 1 mg via INTRAVENOUS
  Filled 2019-11-18: qty 1

## 2019-11-18 MED ORDER — ONDANSETRON HCL 4 MG PO TABS: 4.0000 mg | ORAL_TABLET | Freq: Four times a day (QID) | ORAL | Status: DC | PRN

## 2019-11-18 MED ORDER — ACETAMINOPHEN 325 MG PO TABS
650.0000 mg | ORAL_TABLET | Freq: Four times a day (QID) | ORAL | Status: DC | PRN
  Administered 2019-11-20: 18:00:00 650 mg via ORAL
  Filled 2019-11-18: qty 2

## 2019-11-18 MED ORDER — ACETAMINOPHEN 650 MG RE SUPP: 650.0000 mg | Freq: Four times a day (QID) | RECTAL | Status: DC | PRN

## 2019-11-18 MED ORDER — SODIUM CHLORIDE 0.9 % IV SOLN: INTRAVENOUS | Status: AC

## 2019-11-18 MED ORDER — MORPHINE SULFATE (PF) 4 MG/ML IV SOLN
4.0000 mg | INTRAVENOUS | Status: DC | PRN
  Administered 2019-11-19 – 2019-11-20 (×9): 4 mg via INTRAVENOUS
  Filled 2019-11-18 (×9): qty 1

## 2019-11-18 MED ORDER — SODIUM CHLORIDE 0.9 % IV SOLN
Freq: Once | INTRAVENOUS | Status: AC
  Filled 2019-11-18: qty 95

## 2019-11-18 MED ORDER — ENOXAPARIN SODIUM 40 MG/0.4ML ~~LOC~~ SOLN
40.0000 mg | SUBCUTANEOUS | Status: DC
  Administered 2019-11-19 – 2019-11-21 (×3): 40 mg via SUBCUTANEOUS
  Filled 2019-11-18 (×3): qty 0.4

## 2019-11-18 NOTE — ED Provider Notes (Signed)
[facility] Emergency Department Provider Note ____________________________________________   First MD Initiated Contact with Patient 11/18/19 1750     (approximate)  I have reviewed the triage vital signs and the nursing notes.   HISTORY  Chief Complaint Flank Pain    HPI Charles Beasley is a 44 y.o. male with PMH as noted below including prior kidney stones who presents with acute onset of right flank pain a few hours ago, radiating to the right abdomen, and associated with nausea and vomiting.  His urine also has been somewhat dark in color.  He states that the pain feels somewhat like prior kidney stones, but more like when he had colitis in the past.  He denies any associated diarrhea or fever.  Past Medical History:  Diagnosis Date  . Asthma   . Bladder cancer ([place])   . Colitis   . Diabetes mellitus without complication ([place])   . Diverticulosis   . History of kidney stones   . IBS (irritable bowel syndrome)     Patient Active Problem List   Diagnosis Date Noted  . Intractable pain 11/18/2019  . Renal colic on right side [id number]  . Ureteral calculus, right 11/18/2019  . History of colitis 11/18/2019  . UTI (urinary tract infection) 11/18/2019  . Abnormal urine cytology 09/30/2019  . Colitis 09/06/2019  . Diabetes mellitus without complication ([place])   . Asthma     History reviewed. No pertinent surgical history.  Prior to Admission medications   Medication Sig Start Date End Date Taking? Authorizing Provider  acetaminophen (TYLENOL) 325 MG tablet Take 2 tablets (650 mg total) by mouth every 6 (six) hours as needed for mild pain or fever (or Fever >/= 101). 09/10/19   Wyvonnia Dusky, MD  albuterol (PROVENTIL HFA;VENTOLIN HFA) 108 (90 BASE) MCG/ACT inhaler Inhale 2 puffs into the lungs every 6 (six) hours as needed. Reported on 11/08/2015    [provider]  busPIRone (BUSPAR) 7.5 MG tablet Take 7.5 mg by mouth 2 (two) times  daily. 08/29/19   [provider]  fexofenadine (ALLEGRA) 180 MG tablet Take 180 mg by mouth daily as needed for allergies.     [provider]  glipiZIDE (GLUCOTROL XL) 5 MG 24 hr tablet Take 5 mg by mouth daily. 09/02/19   [provider]  meloxicam (MOBIC) 7.5 MG tablet Take 7.5 mg by mouth daily.     [provider]  metFORMIN (GLUCOPHAGE) 1000 MG tablet Take 1,000 mg by mouth 2 (two) times daily. 08/19/19   [provider]  omeprazole (PRILOSEC) 20 MG capsule Take 20 mg by mouth 2 (two) times daily. 08/25/19   [provider]  promethazine (PHENERGAN) 25 MG tablet Take 25 mg by mouth every 6 (six) hours as needed for nausea. 09/02/19 09/09/19  [provider]  simvastatin (ZOCOR) 20 MG tablet Take 20 mg by mouth at bedtime. 08/16/19   [provider]    Allergies Montelukast and Sertraline  Family History  Problem Relation Age of Onset  . Breast cancer Mother   . Hypertension Father   . Prostate cancer Neg Hx   . Bladder Cancer Neg Hx   . Kidney cancer Neg Hx     Social History Social History   Tobacco Use  . Smoking status: Former Smoker    Types: Cigarettes  . Smokeless tobacco: Never Used  Substance Use Topics  . Alcohol use: No    Alcohol/week: 0.0 standard drinks  .  Drug use: No    Review of Systems  Constitutional: No fever. Eyes: No redness. ENT: No sore throat. Cardiovascular: Denies chest pain. Respiratory: Denies shortness of breath. Gastrointestinal: Positive for nausea and vomiting. Genitourinary: Positive for hematuria. Musculoskeletal: Positive for back pain. Skin: Negative for rash. Neurological: Negative for headache.   ____________________________________________   PHYSICAL EXAM:  VITAL SIGNS: ED Triage Vitals  Enc Vitals Group     BP 11/18/19 1623 134/85     Pulse Rate 11/18/19 1623 (!) 103     Resp 11/18/19 1623 16     Temp 11/18/19 1623 98.5 F (36.9 C)     Temp Source  11/18/19 1623 Oral     SpO2 11/18/19 1623 96 %     Weight 11/18/19 1622 155 lb (70.3 kg)     Height 11/18/19 1622 5\' 9"  (1.753 m)     Head Circumference --      Peak Flow --      Pain Score 11/18/19 1622 8     Pain Loc --      Pain Edu? --      Excl. in [place]? --     Constitutional: Alert and oriented.  Uncomfortable appearing but in no acute distress. Eyes: Conjunctivae are normal.  Head: Atraumatic. Nose: No congestion/rhinnorhea. Mouth/Throat: Mucous membranes are moist.   Neck: Normal range of motion.  Cardiovascular:   Good peripheral circulation. Respiratory: Normal respiratory effort.  No retractions. Gastrointestinal: Soft with mild right mid abdominal tenderness.  No distention.  Genitourinary: Right flank and CVA tenderness. Musculoskeletal: Extremities warm and well perfused.  Neurologic:  Normal speech and language. No gross focal neurologic deficits are appreciated.  Skin:  Skin is warm and dry. No rash noted. Psychiatric: Mood and affect are normal. Speech and behavior are normal.  ____________________________________________   LABS (all labs ordered are listed, but only abnormal results are displayed)  Labs Reviewed  COMPREHENSIVE METABOLIC PANEL - Abnormal; Notable for the following components:      Result Value   CO2 21 (*)    Glucose, Bld 161 (*)    All other components within normal limits  CBC - Abnormal; Notable for the following components:   WBC 14.0 (*)    Platelets 423 (*)    All other components within normal limits  URINALYSIS, COMPLETE (UACMP) WITH MICROSCOPIC - Abnormal; Notable for the following components:   Color, Urine YELLOW (*)    APPearance CLOUDY (*)    Glucose, UA 50 (*)    Hgb urine dipstick LARGE (*)    Protein, ur 100 (*)    RBC / HPF >50 (*)    All other components within normal limits  SARS CORONAVIRUS 2 (TAT 6-24 HRS)  LIPASE, BLOOD  HEMOGLOBIN A1C    ____________________________________________  EKG   ____________________________________________  RADIOLOGY  CT abdomen/pelvis: 3 mm proximal right ureteral stone  ____________________________________________   PROCEDURES  Procedure(s) performed: No  Procedures  Critical Care performed: No ____________________________________________   INITIAL IMPRESSION / ASSESSMENT AND PLAN / ED COURSE  Pertinent labs & imaging results that were available during my care of the patient were reviewed by me and considered in my medical decision making (see chart for details).  44 year old male with PMH as noted above including prior kidney stones presents with right flank pain radiating to the right abdomen associated with vomiting and with hematuria.  I reviewed the past medical records in [place].  The patient was most recently seen for right lower quadrant and flank  pain last month with a negative CT.  Previously in February he was admitted for similar right-sided pain showing colitis.  On exam, the patient is uncomfortable appearing but in no acute distress.  His vital signs are normal except for mild tachycardia.  He has right flank and right mid abdominal mild tenderness.  Lab work-up is remarkable for elevated WBC count, urinalysis shows significant RBCs.  CT shows 3 mm proximal right ureteral stone and no colonic or other intestinal abnormalities.  Overall presentation is consistent with pain due to an acute ureteral stone.  Given the reassuring lab work-up and the small size of the stone on CT I anticipate that the patient will be able to go home with adequate pain control.  He had minimal response to morphine, so I have ordered Toradol and lidocaine.  ----------------------------------------- 9:44 PM on 11/18/2019 -----------------------------------------  The patient had brief improvement with Toradol, and then again with lidocaine, but now continues to have refractory pain.   Based on further discussion with him, I will admit for pain control.  I discussed the case with Dr. Erlene Quan from urology who will follow the patient.  I then discussed his case with Dr. Damita Dunnings from the hospitalist service. ____________________________________________   FINAL CLINICAL IMPRESSION(S) / ED DIAGNOSES  Final diagnoses:  Kidney stone      NEW MEDICATIONS STARTED DURING THIS VISIT:  New Prescriptions   No medications on file     Note:  This document was prepared using Dragon voice recognition software and may include unintentional dictation errors.    Arta Silence, MD 11/18/19 2145

## 2019-11-18 NOTE — ED Triage Notes (Signed)
Pt here for acute onset right flank pain that radiates around to right abdomen. Pt has hx of kidney stones but he thinks it feels more like when he had colitis.  Pt has vomited X 3 since pain onset r/t pain.  Pt appears very uncomfortable and appears similar to someone with kidney stone.  No fever.

## 2019-11-18 NOTE — ED Triage Notes (Signed)
First Nurse Note:  Patient presents to the ED with severe right lower quadrant abdominal and back pain.  Patient was given 123mcg of fentanyl by EMS.  Patient has history of bladder cancer and cystitis.

## 2019-11-18 NOTE — H&P (Signed)
History and Physical    Charles Beasley [id number] DOB: 02-Nov-1975 DOA: 11/18/2019  PCP: [facility]   Patient coming from: Home I have personally briefly reviewed patient's old medical records in [place]  Chief Complaint: Right flank pain  HPI: Charles Beasley is a 44 y.o. male with medical history significant for diabetes, nephrolithiasis and BPH followed by urology at [facility], recent work-up for bladder wall thickening with unclear final diagnosis,  and recently hospitalized for right-sided colitis in February 2021 who presents to the emergency room with a few hour history of right flank pain radiating to the right abdomen, sharp and severe in intensity associated with nausea and vomiting.  He denies fever or chills and denies diarrhea.  His pain was unrelieved with medications at home and he presented to the emergency room  ED Course: Vitals in the emergency room were within normal limits except for mild tachycardia of 103.  White cell count elevated at 14,000.  Blood work otherwise unremarkable.  Urinalysis strongly consistent with UTI.  CT renal stone protocol showed a 3 mm right ureteral calculus.  Patient received pain medicine in the emergency room but continued to be in intractable pain.  Hospitalist consulted for admission.  Review of Systems: As per HPI otherwise 10 point review of systems negative.    Past Medical History:  Diagnosis Date  . Asthma       . Colitis   . Diabetes mellitus without complication ([place])   . Diverticulosis   . History of kidney stones   . IBS (irritable bowel syndrome)     History reviewed. No pertinent surgical history.   reports that he has quit smoking. His smoking use included cigarettes. He has never used smokeless tobacco. He reports that he does not drink alcohol or use drugs.  Allergies  Allergen Reactions  . Montelukast Photosensitivity  . Sertraline Other (See Comments)    Feels out of it, zombie    Family History   Problem Relation Age of Onset  . Breast cancer Mother   . Hypertension Father   . Prostate cancer Neg Hx   . Bladder Cancer Neg Hx   . Kidney cancer Neg Hx      Prior to Admission medications   Medication Sig Start Date End Date Taking? Authorizing Provider  acetaminophen (TYLENOL) 325 MG tablet Take 2 tablets (650 mg total) by mouth every 6 (six) hours as needed for mild pain or fever (or Fever >/= 101). 09/10/19   Wyvonnia Dusky, MD  albuterol (PROVENTIL HFA;VENTOLIN HFA) 108 (90 BASE) MCG/ACT inhaler Inhale 2 puffs into the lungs every 6 (six) hours as needed. Reported on 11/08/2015    [provider]  busPIRone (BUSPAR) 7.5 MG tablet Take 7.5 mg by mouth 2 (two) times daily. 08/29/19   [provider]  fexofenadine (ALLEGRA) 180 MG tablet Take 180 mg by mouth daily as needed for allergies.     [provider]  glipiZIDE (GLUCOTROL XL) 5 MG 24 hr tablet Take 5 mg by mouth daily. 09/02/19   [provider]  meloxicam (MOBIC) 7.5 MG tablet Take 7.5 mg by mouth daily.     [provider]  metFORMIN (GLUCOPHAGE) 1000 MG tablet Take 1,000 mg by mouth 2 (two) times daily. 08/19/19   [provider]  omeprazole (PRILOSEC) 20 MG capsule Take 20 mg by mouth 2 (two) times daily. 08/25/19   [provider]  promethazine (PHENERGAN) 25 MG tablet Take 25  mg by mouth every 6 (six) hours as needed for nausea. 09/02/19 09/09/19  [provider]  simvastatin (ZOCOR) 20 MG tablet Take 20 mg by mouth at bedtime. 08/16/19   [provider]    Physical Exam: Vitals:   11/18/19 1622 11/18/19 1623 11/18/19 1958  BP:  134/85 120/82  Pulse:  (!) 103 85  Resp:  16 15  Temp:  98.5 F (36.9 C)   TempSrc:  Oral   SpO2:  96% 97%  Weight: 70.3 kg    Height: 5\' 9"  (1.753 m)       Vitals:   11/18/19 1622 11/18/19 1623 11/18/19 1958  BP:  134/85 120/82  Pulse:  (!) 103 85  Resp:  16 15  Temp:  98.5 F (36.9 C)   TempSrc:  Oral    SpO2:  96% 97%  Weight: 70.3 kg    Height: 5\' 9"  (1.753 m)      Constitutional: Alert and awake, oriented x3, not in any acute distress. Eyes: PERLA, EOMI, irises appear normal, anicteric sclera,  ENMT: external ears and nose appear normal, normal hearing             Lips appears normal, oropharynx mucosa, tongue, posterior pharynx appear normal  Neck: neck appears normal, no masses, normal ROM, no thyromegaly, no JVD  CVS: S1-S2 clear, no murmur rubs or gallops,  , no carotid bruits, pedal pulses palpable, No LE edema Respiratory:  clear to auscultation bilaterally, no wheezing, rales or rhonchi. Respiratory effort normal. No accessory muscle use.  Abdomen: Right CVA tenderness and tenderness in RLQ, nondistended, normal bowel sounds, no hepatosplenomegaly, no hernias Musculoskeletal: : no cyanosis, clubbing , no contractures or atrophy Neuro: Cranial nerves II-XII intact, sensation, reflexes normal, strength Psych: judgement and insight appear normal, stable mood and affect,  Skin: no rashes or lesions or ulcers, no induration or nodules   Labs on Admission: I have personally reviewed following labs and imaging studies  CBC: Recent Labs  Lab 11/18/19 1624  WBC 14.0*  HGB 14.5  HCT 41.5  MCV 89.1  PLT 99991111*   Basic Metabolic Panel: Recent Labs  Lab 11/18/19 1624  NA 138  K 4.0  CL 107  CO2 21*  GLUCOSE 161*  BUN 15  CREATININE 0.75  CALCIUM 9.9   GFR: Estimated Creatinine Clearance: 115.9 mL/min (by C-G formula based on SCr of 0.75 mg/dL). Liver Function Tests: Recent Labs  Lab 11/18/19 1624  AST 20  ALT 20  ALKPHOS 66  BILITOT 0.6  PROT 8.0  ALBUMIN 4.6   Recent Labs  Lab 11/18/19 1624  LIPASE 26   No results for input(s): AMMONIA in the last 168 hours. Coagulation Profile: No results for input(s): INR, PROTIME in the last 168 hours. Cardiac Enzymes: No results for input(s): CKTOTAL, CKMB, CKMBINDEX, TROPONINI in the last 168 hours. BNP (last 3  results) No results for input(s): PROBNP in the last 8760 hours. HbA1C: No results for input(s): HGBA1C in the last 72 hours. CBG: No results for input(s): GLUCAP in the last 168 hours. Lipid Profile: No results for input(s): CHOL, HDL, LDLCALC, TRIG, CHOLHDL, LDLDIRECT in the last 72 hours. Thyroid Function Tests: No results for input(s): TSH, T4TOTAL, FREET4, T3FREE, THYROIDAB in the last 72 hours. Anemia Panel: No results for input(s): VITAMINB12, FOLATE, FERRITIN, TIBC, IRON, RETICCTPCT in the last 72 hours. Urine analysis:    Component Value Date/Time   COLORURINE YELLOW (A) 11/18/2019 1624   APPEARANCEUR CLOUDY (A) 11/18/2019 1624  APPEARANCEUR Cloudy (A) 10/29/2019 1512   LABSPEC 1.025 11/18/2019 1624   PHURINE 7.0 11/18/2019 1624   GLUCOSEU 50 (A) 11/18/2019 1624   HGBUR LARGE (A) 11/18/2019 1624   BILIRUBINUR NEGATIVE 11/18/2019 1624   BILIRUBINUR Negative 10/29/2019 1512   [place] 11/18/2019 1624   PROTEINUR 100 (A) 11/18/2019 1624   NITRITE NEGATIVE 11/18/2019 1624   LEUKOCYTESUR NEGATIVE 11/18/2019 1624    Radiological Exams on Admission: CT Renal Stone Study  Result Date: 11/18/2019 CLINICAL DATA:  Severe right lower quadrant abdominal and back pain EXAM: CT ABDOMEN AND PELVIS WITHOUT CONTRAST TECHNIQUE: Multidetector CT imaging of the abdomen and pelvis was performed following the standard protocol without IV contrast. COMPARISON:  October 12, 2019 FINDINGS: Lower chest: The visualized heart size within normal limits. No pericardial fluid/thickening. No hiatal hernia. The visualized portions of the lungs are clear. Hepatobiliary: There is a tiny low-density lesion seen in the anterior right liver lobe which is unchanged from prior exam. No evidence of calcified gallstones or biliary ductal dilatation. Pancreas:  Unremarkable.  No surrounding inflammatory changes. Spleen: Normal in size. Although limited due to the lack of intravenous contrast, normal in  appearance. Adrenals/Urinary Tract: Both adrenal glands appear normal. Punctate calcifications seen in the upper pole of the right kidney and lower pole of the left kidney measuring less than 3 mm. There is a proximal right ureteral calculus measuring 3 mm causing mild right pelvicaliectasis and proximal ureterectasis. There is mild fat stranding changes seen around the proximal ureter. Stomach/Bowel: The stomach, small bowel, are normal in appearance. No inflammatory changes or obstructive findings. Scattered colonic diverticula are noted without diverticulitis. Vascular/Lymphatic: There are no enlarged abdominal or pelvic lymph nodes. Scattered aortic atherosclerotic calcifications are seen without aneurysmal dilatation. Reproductive: The prostate is unremarkable. Other: No evidence of abdominal wall mass or hernia. Musculoskeletal: No acute or significant osseous findings. IMPRESSION: 3 mm proximal right ureteral calculus causing mild right pelviectasis with periureteral stranding. Punctate nonobstructing bilateral renal calculi. Diverticulosis without diverticulitis. Aortic Atherosclerosis (ICD10-I70.0). Electronically Signed   By: Prudencio Pair M.D.   On: 11/18/2019 17:07    EKG: Independently reviewed.   Assessment/Plan Principal Problem:   Intractable pain   Renal colic on right side   Ureteral calculus, right with pelviectasis and periureteral stranding   UTI (urinary tract infection) -CT abdomen and pelvis showing 3 mm proximal right ureteral calculus causing mild right pelviectasis with periureteral ureteral stranding -Urinalysis consistent with UTI -IV hydration -Flomax -Pain control with Toradol and morphine for breakthrough -IV Rocephin -Follow urine cultures - Urology consult placed. Keep NPO and strain all urine per Dr Erlene Quan    Diabetes mellitus without complication ([place]) -Regular insulin coverage pending med rec    History of colitis -No acute concerns at this time    DVT  prophylaxis: Lovenox  Code Status: full code  Family Communication:  none  Disposition Plan: Back to previous home environment Consults called: Urology, dr. Erlene Quan Status:obs    Athena Masse MD [facility]     11/18/2019, 9:23 PM

## 2019-11-19 ENCOUNTER — Observation Stay: Payer: BC Managed Care – PPO

## 2019-11-19 DIAGNOSIS — N200 Calculus of kidney: Secondary | ICD-10-CM | POA: Diagnosis present

## 2019-11-19 DIAGNOSIS — Z8249 Family history of ischemic heart disease and other diseases of the circulatory system: Secondary | ICD-10-CM | POA: Diagnosis not present

## 2019-11-19 DIAGNOSIS — Z20822 Contact with and (suspected) exposure to covid-19: Secondary | ICD-10-CM | POA: Diagnosis present

## 2019-11-19 DIAGNOSIS — Z79899 Other long term (current) drug therapy: Secondary | ICD-10-CM | POA: Diagnosis not present

## 2019-11-19 DIAGNOSIS — N201 Calculus of ureter: Secondary | ICD-10-CM | POA: Diagnosis present

## 2019-11-19 DIAGNOSIS — J45909 Unspecified asthma, uncomplicated: Secondary | ICD-10-CM | POA: Diagnosis present

## 2019-11-19 DIAGNOSIS — N400 Enlarged prostate without lower urinary tract symptoms: Secondary | ICD-10-CM | POA: Diagnosis present

## 2019-11-19 DIAGNOSIS — Z7984 Long term (current) use of oral hypoglycemic drugs: Secondary | ICD-10-CM | POA: Diagnosis not present

## 2019-11-19 DIAGNOSIS — N23 Unspecified renal colic: Secondary | ICD-10-CM | POA: Diagnosis not present

## 2019-11-19 DIAGNOSIS — Z8551 Personal history of malignant neoplasm of bladder: Secondary | ICD-10-CM | POA: Diagnosis not present

## 2019-11-19 DIAGNOSIS — R21 Rash and other nonspecific skin eruption: Secondary | ICD-10-CM | POA: Diagnosis present

## 2019-11-19 DIAGNOSIS — E119 Type 2 diabetes mellitus without complications: Secondary | ICD-10-CM | POA: Diagnosis present

## 2019-11-19 DIAGNOSIS — N390 Urinary tract infection, site not specified: Secondary | ICD-10-CM | POA: Diagnosis present

## 2019-11-19 DIAGNOSIS — Z87891 Personal history of nicotine dependence: Secondary | ICD-10-CM | POA: Diagnosis not present

## 2019-11-19 DIAGNOSIS — N202 Calculus of kidney with calculus of ureter: Secondary | ICD-10-CM | POA: Diagnosis present

## 2019-11-19 DIAGNOSIS — Z803 Family history of malignant neoplasm of breast: Secondary | ICD-10-CM | POA: Diagnosis not present

## 2019-11-19 DIAGNOSIS — Z791 Long term (current) use of non-steroidal anti-inflammatories (NSAID): Secondary | ICD-10-CM | POA: Diagnosis not present

## 2019-11-19 DIAGNOSIS — R52 Pain, unspecified: Secondary | ICD-10-CM | POA: Diagnosis not present

## 2019-11-19 LAB — HEMOGLOBIN A1C
Hgb A1c MFr Bld: 8.1 % — ABNORMAL HIGH (ref 4.8–5.6)
Mean Plasma Glucose: 185.77 mg/dL

## 2019-11-19 LAB — GLUCOSE, CAPILLARY
Glucose-Capillary: 191 mg/dL — ABNORMAL HIGH (ref 70–99)
Glucose-Capillary: 164 mg/dL — ABNORMAL HIGH (ref 70–99)
Glucose-Capillary: 186 mg/dL — ABNORMAL HIGH (ref 70–99)
Glucose-Capillary: 164 mg/dL — ABNORMAL HIGH (ref 70–99)

## 2019-11-19 LAB — SARS CORONAVIRUS 2 (TAT 6-24 HRS): SARS Coronavirus 2: NEGATIVE

## 2019-11-19 MED ORDER — ALBUTEROL SULFATE HFA 108 (90 BASE) MCG/ACT IN AERS: 2.0000 | INHALATION_SPRAY | Freq: Four times a day (QID) | RESPIRATORY_TRACT | Status: DC | PRN

## 2019-11-19 MED ORDER — ALBUTEROL SULFATE (2.5 MG/3ML) 0.083% IN NEBU: 2.5000 mg | INHALATION_SOLUTION | Freq: Four times a day (QID) | RESPIRATORY_TRACT | Status: DC | PRN

## 2019-11-19 MED ORDER — PANTOPRAZOLE SODIUM 40 MG PO TBEC
40.0000 mg | DELAYED_RELEASE_TABLET | Freq: Every day | ORAL | Status: DC
  Administered 2019-11-19 – 2019-11-21 (×3): 40 mg via ORAL
  Filled 2019-11-19 (×3): qty 1

## 2019-11-19 MED ORDER — OXYCODONE HCL 5 MG PO TABS
10.0000 mg | ORAL_TABLET | Freq: Four times a day (QID) | ORAL | Status: DC
  Administered 2019-11-19 – 2019-11-20 (×4): 10 mg via ORAL
  Filled 2019-11-19 (×4): qty 2

## 2019-11-19 MED ORDER — SIMVASTATIN 20 MG PO TABS
20.0000 mg | ORAL_TABLET | Freq: Every day | ORAL | Status: DC
  Administered 2019-11-19 – 2019-11-20 (×2): 20 mg via ORAL
  Filled 2019-11-19 (×3): qty 1

## 2019-11-19 MED ORDER — BUSPIRONE HCL 15 MG PO TABS
7.5000 mg | ORAL_TABLET | Freq: Two times a day (BID) | ORAL | Status: DC
  Administered 2019-11-19 – 2019-11-21 (×5): 7.5 mg via ORAL
  Filled 2019-11-19 (×6): qty 1

## 2019-11-19 MED ORDER — LORATADINE 10 MG PO TABS: 10.0000 mg | ORAL_TABLET | Freq: Every day | ORAL | Status: DC | PRN

## 2019-11-19 MED ORDER — SODIUM CHLORIDE 0.9 % IV SOLN
2.0000 g | INTRAVENOUS | Status: DC
  Administered 2019-11-19 – 2019-11-20 (×2): 2 g via INTRAVENOUS
  Filled 2019-11-19: qty 20
  Filled 2019-11-19 (×2): qty 2

## 2019-11-19 MED ORDER — CITALOPRAM HYDROBROMIDE 20 MG PO TABS
20.0000 mg | ORAL_TABLET | Freq: Every day | ORAL | Status: DC
  Administered 2019-11-19 – 2019-11-21 (×3): 20 mg via ORAL
  Filled 2019-11-19 (×3): qty 1

## 2019-11-19 NOTE — Progress Notes (Addendum)
PROGRESS NOTE    Charles Beasley   [id number]  DOB: [birth year]  PCP: [facility]    DOA: 11/18/2019 LOS: 0   Brief Narrative   Charles Beasley is a 44 y.o. male with history of type 2 diabetes, nephrolithiasis and BPH followed by urology at [facility], recent work-up for bladder wall thickening with unclear final diagnosis,  and recently hospitalized for right-sided colitis in February 2021 who presented to the ED on 11/18/19  with a few hour history of right flank pain radiating to the right abdomen, sharp and severe in intensity associated with nausea and vomiting. In the ED, HR 103 and WBC 14k, otherwise labs and vitals unremarkable.  CT renal stone protocol showed a 3 mm proximal right ureteral calculus, non-obstructing.  Admitted to hospitalist service with urology consulted.    Assessment & Plan   Principal Problem:   Intractable pain Active Problems:   Diabetes mellitus without complication (HCC)   Renal colic on right side   Ureteral calculus, right   History of colitis   UTI (urinary tract infection)  Intractable pain Renal colic on right side Ureteral calculus, right with pelviectasis and periureteral stranding UTI (urinary tract infection) -IV hydration -Flomax -Pain control with Toradol and morphine for breakthrough -IV Rocephin -Follow urine cultures -Urology consulted --diet okay for now, further plans pending KUB and passing trial   Diabetes mellitus without complication (HCC) -Regular insulin coverage pending med rec  History of colitis -No acute concerns at this time  Patient BMI: Body mass index is 23.73 kg/m.   DVT prophylaxis: Lovenox  Diet:  Diet Orders (From admission, onward)    Start     Ordered   11/19/19 1020  Diet Carb Modified Fluid consistency: Thin; Room service appropriate? Yes  Diet effective now    Question Answer Comment  Diet-HS Snack? Nothing   Calorie Level Medium 1600-2000   Fluid consistency: Thin   Room service  appropriate? Yes      11/19/19 1019            Code Status: Full Code    Subjective 11/19/19    Patient seen this AM.  No acute events reported.  He reports ongoing pain, right flank and right side abdomen.  Has not seen any pieces of stone in urine yet.  No fever or chills.  Says pain medicine earlier wore off too fast.   Disposition Plan & Communication   Dispo & Barriers: Expect d/c home in another 24-48 hours pending adequate pain control with oral medication and clearance by urology Coming from: home Exp d/c date: 4/22 Medically stable for d/c? No  INPATIENT Status: patient continues to have intractable pain requiring IV medications, and close urologic monitoring, therefore continued hospital care is required at this time.  Family Communication: none at bedside, will attempt to call  Consults, Procedures, Significant Events   Consultants:   Urology  Procedures:   None  Antimicrobials:   Rocephin    Objective   Vitals:   11/18/19 1623 11/18/19 1958 11/19/19 0121 11/19/19 1212  BP: 134/85 120/82 119/77 102/72  Pulse: (!) 103 85 81 85  Resp: 16 15 20 18   Temp: 98.5 F (36.9 C)  97.8 F (36.6 C) 98.2 F (36.8 C)  TempSrc: Oral  Oral Oral  SpO2: 96% 97% 97% 95%  Weight:   72.9 kg   Height:   5\' 9"  (1.753 m)     Intake/Output Summary (Last 24 hours) at 11/19/2019 1307  Last data filed at 11/19/2019 0815 Gross per 24 hour  Intake 591.63 ml  Output 150 ml  Net 441.63 ml   Filed Weights   11/18/19 1622 11/19/19 0121  Weight: 70.3 kg 72.9 kg    Physical Exam:  General exam: awake, alert, wincing in pain HEENT: atraumatic, clear conjunctiva, anicteric sclera, moist mucus membranes, hearing grossly normal  Respiratory system: CTAB, no wheezes, rales or rhonchi, normal respiratory effort. Cardiovascular system: normal S1/S2, RRR, no JVD, murmurs, rubs, gallops, no pedal edema.   Gastrointestinal system: right side tender without rebound, ND, no HSM  felt, +bowel sounds. Central nervous system: A&O x4. no gross focal neurologic deficits, normal speech Extremities: moves all, no edema, normal tone Skin: dry, intact, normal temperature, normal color  Psychiatry: normal mood, congruent affect, judgement and insight appear normal  Labs   Data Reviewed: I have personally reviewed following labs and imaging studies  CBC: Recent Labs  Lab 11/18/19 1624  WBC 14.0*  HGB 14.5  HCT 41.5  MCV 89.1  PLT 99991111*   Basic Metabolic Panel: Recent Labs  Lab 11/18/19 1624  NA 138  K 4.0  CL 107  CO2 21*  GLUCOSE 161*  BUN 15  CREATININE 0.75  CALCIUM 9.9   GFR: Estimated Creatinine Clearance: 116.6 mL/min (by C-G formula based on SCr of 0.75 mg/dL). Liver Function Tests: Recent Labs  Lab 11/18/19 1624  AST 20  ALT 20  ALKPHOS 66  BILITOT 0.6  PROT 8.0  ALBUMIN 4.6   Recent Labs  Lab 11/18/19 1624  LIPASE 26   No results for input(s): AMMONIA in the last 168 hours. Coagulation Profile: No results for input(s): INR, PROTIME in the last 168 hours. Cardiac Enzymes: No results for input(s): CKTOTAL, CKMB, CKMBINDEX, TROPONINI in the last 168 hours. BNP (last 3 results) No results for input(s): PROBNP in the last 8760 hours. HbA1C: Recent Labs    11/18/19 1624  HGBA1C 8.1*   CBG: Recent Labs  Lab 11/19/19 0811 11/19/19 1211  GLUCAP 191* 164*   Lipid Profile: No results for input(s): CHOL, HDL, LDLCALC, TRIG, CHOLHDL, LDLDIRECT in the last 72 hours. Thyroid Function Tests: No results for input(s): TSH, T4TOTAL, FREET4, T3FREE, THYROIDAB in the last 72 hours. Anemia Panel: No results for input(s): VITAMINB12, FOLATE, FERRITIN, TIBC, IRON, RETICCTPCT in the last 72 hours. Sepsis Labs: No results for input(s): PROCALCITON, LATICACIDVEN in the last 168 hours.  Recent Results (from the past 240 hour(s))  SARS CORONAVIRUS 2 (TAT 6-24 HRS) Nasopharyngeal Nasopharyngeal Swab     Status: None   Collection Time: 11/18/19   9:27 PM   Specimen: Nasopharyngeal Swab  Result Value Ref Range Status   SARS Coronavirus 2 NEGATIVE NEGATIVE Final    Comment: (NOTE) SARS-CoV-2 target nucleic acids are NOT DETECTED. The SARS-CoV-2 RNA is generally detectable in upper and lower respiratory specimens during the acute phase of infection. Negative results do not preclude SARS-CoV-2 infection, do not rule out co-infections with other pathogens, and should not be used as the sole basis for treatment or other patient management decisions. Negative results must be combined with clinical observations, patient history, and epidemiological information. The expected result is Negative. Fact Sheet for Patients: SugarRoll.be Fact Sheet for Healthcare Providers: https://www.woods-mathews.com/ This test is not yet approved or cleared by the [place] FDA and  has been authorized for detection and/or diagnosis of SARS-CoV-2 by FDA under an Emergency Use Authorization (EUA). This EUA will remain  in effect (meaning this test can  be used) for the duration of the COVID-19 declaration under Section 56 4(b)(1) of the Act, 21 U.S.C. section 360bbb-3(b)(1), unless the authorization is terminated or revoked sooner. Performed at [facility], [place] [address], [place], [place] [address]       Imaging Studies   DG Abd 1 View  Result Date: 11/19/2019 CLINICAL DATA:  Right ureteral calculus. EXAM: ABDOMEN - 1 VIEW COMPARISON:  CT scan from yesterday. FINDINGS: The bowel gas pattern is unremarkable. The soft tissue shadows are maintained. No definite renal or right ureteral calculi are demonstrated on the radiograph. The right kidney is largely obscured by the colon. The right ureteral calculus was quite small and is not visualized for certain. The bony structures are intact. IMPRESSION: The right ureteral calculus is not visualized for certain. Electronically Signed   By: Marijo Sanes M.D.    On: 11/19/2019 11:31   CT Renal Stone Study  Result Date: 11/18/2019 CLINICAL DATA:  Severe right lower quadrant abdominal and back pain EXAM: CT ABDOMEN AND PELVIS WITHOUT CONTRAST TECHNIQUE: Multidetector CT imaging of the abdomen and pelvis was performed following the standard protocol without IV contrast. COMPARISON:  October 12, 2019 FINDINGS: Lower chest: The visualized heart size within normal limits. No pericardial fluid/thickening. No hiatal hernia. The visualized portions of the lungs are clear. Hepatobiliary: There is a tiny low-density lesion seen in the anterior right liver lobe which is unchanged from prior exam. No evidence of calcified gallstones or biliary ductal dilatation. Pancreas:  Unremarkable.  No surrounding inflammatory changes. Spleen: Normal in size. Although limited due to the lack of intravenous contrast, normal in appearance. Adrenals/Urinary Tract: Both adrenal glands appear normal. Punctate calcifications seen in the upper pole of the right kidney and lower pole of the left kidney measuring less than 3 mm. There is a proximal right ureteral calculus measuring 3 mm causing mild right pelvicaliectasis and proximal ureterectasis. There is mild fat stranding changes seen around the proximal ureter. Stomach/Bowel: The stomach, small bowel, are normal in appearance. No inflammatory changes or obstructive findings. Scattered colonic diverticula are noted without diverticulitis. Vascular/Lymphatic: There are no enlarged abdominal or pelvic lymph nodes. Scattered aortic atherosclerotic calcifications are seen without aneurysmal dilatation. Reproductive: The prostate is unremarkable. Other: No evidence of abdominal wall mass or hernia. Musculoskeletal: No acute or significant osseous findings. IMPRESSION: 3 mm proximal right ureteral calculus causing mild right pelviectasis with periureteral stranding. Punctate nonobstructing bilateral renal calculi. Diverticulosis without diverticulitis.  Aortic Atherosclerosis (ICD10-I70.0). Electronically Signed   By: Prudencio Pair M.D.   On: 11/18/2019 17:07     Medications   Scheduled Meds: . enoxaparin (LOVENOX) injection  40 mg Subcutaneous Q24H  . insulin aspart  0-9 Units Subcutaneous TID WC  . oxyCODONE  10 mg Oral Q6H  . tamsulosin  0.4 mg Oral Daily   Continuous Infusions: . cefTRIAXone (ROCEPHIN)  IV 2 g (11/19/19 1030)       LOS: 0 days    Time spent: 35 minutes    Ezekiel Slocumb, DO [facility]   If 7PM-7AM, please contact night-coverage [email or web address] 11/19/2019, 1:07 PM

## 2019-11-19 NOTE — Consult Note (Addendum)
Urology Consult  I have been asked to see the patient by Dr. Damita Dunnings, for evaluation and management of right renal colic.  Chief Complaint: Right flank pain  History of Present Illness: Charles Beasley is a 44 y.o. 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    CT stone study revealed a 3 mm proximal right ureteral stone with no right hydronephrosis.  Creatinine stable at 0.75.  WBC count slightly elevated at 14.0.  UA with >50 RBCs/hpf, 6-10 WBCs/hpf, no nitrites, and no bacteria.  He was admitted for pain control on morphine, 1 dose of Toradol administered yesterday.  Patient reports his pain has slightly improved on IV medications.  He reports the pain remains in his right flank and abdomen and has not moved since onset.  Nausea has resolved on Zofran.  Past Medical History:  Diagnosis Date  . Asthma   . Bladder cancer ([place])   . Colitis   . Diabetes mellitus without complication ([place])   . Diverticulosis   . History of kidney stones   . IBS (irritable bowel syndrome)     History reviewed. No pertinent surgical history.  Home Medications:  Current Meds  Medication Sig  . acetaminophen (TYLENOL) 325 MG tablet Take 2 tablets (650 mg total) by mouth every 6 (six) hours as needed for mild pain or fever (or Fever >/= 101).  Marland Kitchen albuterol (PROVENTIL HFA;VENTOLIN HFA) 108 (90 BASE) MCG/ACT inhaler Inhale 2 puffs into the lungs every 6 (six) hours as needed. Reported on 11/08/2015  . busPIRone (BUSPAR) 7.5 MG tablet Take 7.5 mg by mouth 2 (two) times daily.  . citalopram (CELEXA) 20 MG tablet Take 20 mg by mouth daily.  . fexofenadine (ALLEGRA) 180 MG tablet Take 180 mg by mouth daily as needed for  allergies.   Marland Kitchen glipiZIDE (GLUCOTROL XL) 5 MG 24 hr tablet Take 5 mg by mouth daily.  Marland Kitchen ibuprofen (ADVIL) 200 MG tablet Take 200 mg by mouth 2 (two) times daily as needed for moderate pain.  . meloxicam (MOBIC) 7.5 MG tablet Take 7.5 mg by mouth daily.   . metFORMIN (GLUCOPHAGE) 1000 MG tablet Take 1,000 mg by mouth 2 (two) times daily.  Marland Kitchen omeprazole (PRILOSEC) 20 MG capsule Take 20 mg by mouth 2 (two) times daily.  . simvastatin (ZOCOR) 20 MG tablet Take 20 mg by mouth at bedtime.    Allergies:  Allergies  Allergen Reactions  . Montelukast Photosensitivity  . Sertraline Other (See Comments)    Feels out of it, zombie    Family History  Problem Relation Age of Onset  . Breast cancer Mother   . Hypertension Father   . Prostate cancer Neg Hx   . Bladder Cancer Neg Hx   . Kidney cancer Neg Hx     Social History:  reports that he has quit smoking. His smoking use included cigarettes. He has never used smokeless tobacco. He reports that he does not drink alcohol or use drugs.  ROS: A complete review of systems was performed.  All systems are negative except for pertinent findings as noted.  Physical Exam:  Vital signs in last 24 hours: Temp:  [97.8 F (36.6 C)-98.5 F (36.9 C)] 97.8 F (36.6  C) (04/21 0121) Pulse Rate:  [81-103] 81 (04/21 0121) Resp:  [15-20] 20 (04/21 0121) BP: (119-134)/(77-85) 119/77 (04/21 0121) SpO2:  [96 %-97 %] 97 % (04/21 0121) Weight:  [70.3 kg-72.9 kg] 72.9 kg (04/21 0121) Constitutional:  Alert and oriented, uncomfortable appearing, no acute distress HEENT: [place] AT, moist mucus membranes Cardiovascular: No clubbing, cyanosis, or edema. Respiratory: Normal respiratory effort Skin: No rashes, bruises or suspicious lesions Neurologic: Grossly intact, no focal deficits, moving all 4 extremities Psychiatric: Normal mood and affect  Laboratory Data:  Recent Labs    11/18/19 1624  WBC 14.0*  HGB 14.5  HCT 41.5   Recent Labs    11/18/19 1624    NA 138  K 4.0  CL 107  CO2 21*  GLUCOSE 161*  BUN 15  CREATININE 0.75  CALCIUM 9.9   Urinalysis    Component Value Date/Time   COLORURINE YELLOW (A) 11/18/2019 1624   APPEARANCEUR CLOUDY (A) 11/18/2019 1624   APPEARANCEUR Cloudy (A) 10/29/2019 1512   LABSPEC 1.025 11/18/2019 1624   PHURINE 7.0 11/18/2019 1624   GLUCOSEU 50 (A) 11/18/2019 1624   HGBUR LARGE (A) 11/18/2019 1624   BILIRUBINUR NEGATIVE 11/18/2019 1624   BILIRUBINUR Negative 10/29/2019 1512   [place] 11/18/2019 1624   PROTEINUR 100 (A) 11/18/2019 1624   NITRITE NEGATIVE 11/18/2019 1624   LEUKOCYTESUR NEGATIVE 11/18/2019 1624   Results for orders placed or performed during the hospital encounter of 11/18/19  SARS CORONAVIRUS 2 (TAT 6-24 HRS) Nasopharyngeal Nasopharyngeal Swab     Status: None   Collection Time: 11/18/19  9:27 PM   Specimen: Nasopharyngeal Swab  Result Value Ref Range Status   SARS Coronavirus 2 NEGATIVE NEGATIVE Final    Comment: (NOTE) SARS-CoV-2 target nucleic acids are NOT DETECTED. The SARS-CoV-2 RNA is generally detectable in upper and lower respiratory specimens during the acute phase of infection. Negative results do not preclude SARS-CoV-2 infection, do not rule out co-infections with other pathogens, and should not be used as the sole basis for treatment or other patient management decisions. Negative results must be combined with clinical observations, patient history, and epidemiological information. The expected result is Negative. Fact Sheet for Patients: SugarRoll.be Fact Sheet for Healthcare Providers: https://www.woods-mathews.com/ This test is not yet approved or cleared by the [place] FDA and  has been authorized for detection and/or diagnosis of SARS-CoV-2 by FDA under an Emergency Use Authorization (EUA). This EUA will remain  in effect (meaning this test can be used) for the duration of the COVID-19 declaration  under Section 56 4(b)(1) of the Act, 21 U.S.C. section 360bbb-3(b)(1), unless the authorization is terminated or revoked sooner. Performed at [facility], [place] [address], [place], [place] [address]     Radiologic Imaging: CT Renal Stone Study  Result Date: 11/18/2019 CLINICAL DATA:  Severe right lower quadrant abdominal and back pain EXAM: CT ABDOMEN AND PELVIS WITHOUT CONTRAST TECHNIQUE: Multidetector CT imaging of the abdomen and pelvis was performed following the standard protocol without IV contrast. COMPARISON:  October 12, 2019 FINDINGS: Lower chest: The visualized heart size within normal limits. No pericardial fluid/thickening. No hiatal hernia. The visualized portions of the lungs are clear. Hepatobiliary: There is a tiny low-density lesion seen in the anterior right liver lobe which is unchanged from prior exam. No evidence of calcified gallstones or biliary ductal dilatation. Pancreas:  Unremarkable.  No surrounding inflammatory changes. Spleen: Normal in size. Although limited due to the lack of intravenous contrast, normal in appearance. Adrenals/Urinary  Tract: Both adrenal glands appear normal. Punctate calcifications seen in the upper pole of the right kidney and lower pole of the left kidney measuring less than 3 mm. There is a proximal right ureteral calculus measuring 3 mm causing mild right pelvicaliectasis and proximal ureterectasis. There is mild fat stranding changes seen around the proximal ureter. Stomach/Bowel: The stomach, small bowel, are normal in appearance. No inflammatory changes or obstructive findings. Scattered colonic diverticula are noted without diverticulitis. Vascular/Lymphatic: There are no enlarged abdominal or pelvic lymph nodes. Scattered aortic atherosclerotic calcifications are seen without aneurysmal dilatation. Reproductive: The prostate is unremarkable. Other: No evidence of abdominal wall mass or hernia. Musculoskeletal: No acute or significant osseous  findings. IMPRESSION: 3 mm proximal right ureteral calculus causing mild right pelviectasis with periureteral stranding. Punctate nonobstructing bilateral renal calculi. Diverticulosis without diverticulitis. Aortic Atherosclerosis (ICD10-I70.0). Electronically Signed   By: Prudencio Pair M.D.   On: 11/18/2019 17:07   Assessment & Plan:  44 year old male with a history of nephrolithiasis presents with right renal colic in the setting of a 3 mm proximal right ureteral stone.  UA reassuring for infection.  No hydronephrosis, creatinine stable.  Recommend trial of passage and pain control for management of a small ureteral stone at this time.  Would like to defer further endoscopic procedures given his URS 6 months ago.  Patient is not a candidate for ESWL tomorrow given administration of Toradol yesterday with proximal location of stone.  Will order KUB to reevaluate stone location and confirm visualization.  If stone has moved distally and is greater than 1 cm inferior to the renal shadow, may reconsider ESWL tomorrow.  Otherwise, may consider ESWL next week if pain remains uncontrollable.  Recommendations: -Continue Flomax, pain management, and antiemetics -KUB today to reassess stone position and visualization -No plans for urologic procedure today, okay to advance diet  Thank you for involving me in this patient's care, I will continue to follow along.  Debroah Loop, PA-C 11/19/2019 9:50 AM

## 2019-11-20 DIAGNOSIS — N23 Unspecified renal colic: Secondary | ICD-10-CM

## 2019-11-20 LAB — CBC WITH DIFFERENTIAL/PLATELET
Abs Immature Granulocytes: 0.04 10*3/uL (ref 0.00–0.07)
Basophils Absolute: 0.1 10*3/uL (ref 0.0–0.1)
Basophils Relative: 1 %
Eosinophils Absolute: 0.7 10*3/uL — ABNORMAL HIGH (ref 0.0–0.5)
Eosinophils Relative: 7 %
HCT: 38.1 % — ABNORMAL LOW (ref 39.0–52.0)
Hemoglobin: 12.5 g/dL — ABNORMAL LOW (ref 13.0–17.0)
Immature Granulocytes: 0 %
Lymphocytes Relative: 36 %
Lymphs Abs: 3.5 10*3/uL (ref 0.7–4.0)
MCH: 30.8 pg (ref 26.0–34.0)
MCHC: 32.8 g/dL (ref 30.0–36.0)
MCV: 93.8 fL (ref 80.0–100.0)
Monocytes Absolute: 0.7 10*3/uL (ref 0.1–1.0)
Monocytes Relative: 7 %
Neutro Abs: 4.7 10*3/uL (ref 1.7–7.7)
Neutrophils Relative %: 49 %
Platelets: 350 10*3/uL (ref 150–400)
RBC: 4.06 MIL/uL — ABNORMAL LOW (ref 4.22–5.81)
RDW: 13.7 % (ref 11.5–15.5)
WBC: 9.6 10*3/uL (ref 4.0–10.5)
nRBC: 0 % (ref 0.0–0.2)

## 2019-11-20 LAB — GLUCOSE, CAPILLARY
Glucose-Capillary: 119 mg/dL — ABNORMAL HIGH (ref 70–99)
Glucose-Capillary: 183 mg/dL — ABNORMAL HIGH (ref 70–99)
Glucose-Capillary: 182 mg/dL — ABNORMAL HIGH (ref 70–99)
Glucose-Capillary: 196 mg/dL — ABNORMAL HIGH (ref 70–99)

## 2019-11-20 MED ORDER — KETOROLAC TROMETHAMINE 10 MG PO TABS
10.0000 mg | ORAL_TABLET | Freq: Four times a day (QID) | ORAL | Status: DC | PRN
  Administered 2019-11-20 – 2019-11-21 (×3): 10 mg via ORAL
  Filled 2019-11-20 (×5): qty 1

## 2019-11-20 MED ORDER — OXYCODONE HCL 5 MG PO TABS
10.0000 mg | ORAL_TABLET | ORAL | Status: DC
  Administered 2019-11-20 – 2019-11-21 (×7): 10 mg via ORAL
  Filled 2019-11-20 (×7): qty 2

## 2019-11-20 NOTE — Progress Notes (Signed)
PROGRESS NOTE    Charles Beasley   [id number]  DOB: [birth year]  PCP: [facility]    DOA: 11/18/2019 LOS: 1   Brief Narrative   Charles Beasley is a 44 y.o. male with history of type 2 diabetes, nephrolithiasis and BPH followed by urology at [facility], recent work-up for bladder wall thickening with unclear final diagnosis,  and recently hospitalized for right-sided colitis in February 2021 who presented to the ED on 11/18/19  with a few hour history of right flank pain radiating to the right abdomen, sharp and severe in intensity associated with nausea and vomiting. In the ED, HR 103 and WBC 14k, otherwise labs and vitals unremarkable.  CT renal stone protocol showed a 3 mm proximal right ureteral calculus, non-obstructing.  Admitted to hospitalist service with urology consulted.    Assessment & Plan   Principal Problem:   Intractable pain Active Problems:   Diabetes mellitus without complication (HCC)   Renal colic on right side   Ureteral calculus, right   History of colitis   UTI (urinary tract infection)   Ureterolithiasis  Intractable pain Renal colic on right side Ureteral calculus, right with pelviectasis and periureteral stranding UTI (urinary tract infection) -continue IV hydration -Flomax -Pain control with PO oxycodone and toradol, PRN IV morphine -IV Rocephin -Follow urine cultures  -Urology consulted, recommend supportive care and outpatient follow up  Diabetes mellitus without complication ([facility]) -Regular insulin coverage pending med rec  History of colitis -No acute concerns at this time  Patient BMI: Body mass index is 23.73 kg/m.   DVT prophylaxis: Lovenox  Diet:  Diet Orders (From admission, onward)    Start     Ordered   11/19/19 1020  Diet Carb Modified Fluid consistency: Thin; Room service appropriate? Yes  Diet effective now    Question Answer Comment  Diet-HS Snack? Nothing   Calorie Level Medium 1600-2000   Fluid consistency: Thin    Room service appropriate? Yes      11/19/19 1019            Code Status: Full Code    Subjective 11/20/19    Patient seen this AM.  No acute events reported.  He continues to have spikes in his pain and right flank spasms.  No fever or chills.  Says the oral pain medicine wears off couple hours to soon.  Says he has not passed stone.   Disposition Plan & Communication   Dispo & Barriers: Expect d/c home tomorrow pending adequate pain control with oral medication  Coming from: home Exp d/c date: 4/23 Medically stable for d/c? No  INPATIENT Status: patient continues to have intractable pain requiring IV medications, therefore continued hospital care is required at this time.  Family Communication: none at bedside, will attempt to call  Consults, Procedures, Significant Events   Consultants:   Urology  Procedures:   None  Antimicrobials:   Rocephin    Objective   Vitals:   11/19/19 1212 11/19/19 1942 11/20/19 0529 11/20/19 1212  BP: 102/72 113/70 120/84 124/81  Pulse: 85 85 75 75  Resp: 18 16 16 18   Temp: 98.2 F (36.8 C) 97.8 F (36.6 C) (!) 97.5 F (36.4 C) 98 F (36.7 C)  TempSrc: Oral Oral Oral Oral  SpO2: 95% 97% 97% 99%  Weight:      Height:        Intake/Output Summary (Last 24 hours) at 11/20/2019 1345 Last data filed at 11/20/2019 1231 Gross per  24 hour  Intake --  Output 600 ml  Net -600 ml   Filed Weights   11/18/19 1622 11/19/19 0121  Weight: 70.3 kg 72.9 kg    Physical Exam:  General exam: awake, alert, wincing in pain Respiratory system: CTAB, no wheezes, rales or rhonchi, normal respiratory effort. Cardiovascular system: normal S1/S2, RRR, no JVD, murmurs, rubs, gallops, no pedal edema.   Gastrointestinal system: right side tender without rebound, ND. Central nervous system: A&O x4. no gross focal neurologic deficits, normal speech Extremities: moves all, no edema, normal tone Psychiatry: normal mood, congruent affect,  judgement and insight appear normal  Labs   Data Reviewed: I have personally reviewed following labs and imaging studies  CBC: Recent Labs  Lab 11/18/19 1624 11/20/19 0513  WBC 14.0* 9.6  NEUTROABS  --  4.7  HGB 14.5 12.5*  HCT 41.5 38.1*  MCV 89.1 93.8  PLT 423* [id number]   Basic Metabolic Panel: Recent Labs  Lab 11/18/19 1624  NA 138  K 4.0  CL 107  CO2 21*  GLUCOSE 161*  BUN 15  CREATININE 0.75  CALCIUM 9.9   GFR: Estimated Creatinine Clearance: 116.6 mL/min (by C-G formula based on SCr of 0.75 mg/dL). Liver Function Tests: Recent Labs  Lab 11/18/19 1624  AST 20  ALT 20  ALKPHOS 66  BILITOT 0.6  PROT 8.0  ALBUMIN 4.6   Recent Labs  Lab 11/18/19 1624  LIPASE 26   No results for input(s): AMMONIA in the last 168 hours. Coagulation Profile: No results for input(s): INR, PROTIME in the last 168 hours. Cardiac Enzymes: No results for input(s): CKTOTAL, CKMB, CKMBINDEX, TROPONINI in the last 168 hours. BNP (last 3 results) No results for input(s): PROBNP in the last 8760 hours. HbA1C: Recent Labs    11/18/19 1624  HGBA1C 8.1*   CBG: Recent Labs  Lab 11/19/19 1211 11/19/19 1658 11/19/19 2120 11/20/19 0800 11/20/19 1210  GLUCAP 164* 186* 164* 119* 183*   Lipid Profile: No results for input(s): CHOL, HDL, LDLCALC, TRIG, CHOLHDL, LDLDIRECT in the last 72 hours. Thyroid Function Tests: No results for input(s): TSH, T4TOTAL, FREET4, T3FREE, THYROIDAB in the last 72 hours. Anemia Panel: No results for input(s): VITAMINB12, FOLATE, FERRITIN, TIBC, IRON, RETICCTPCT in the last 72 hours. Sepsis Labs: No results for input(s): PROCALCITON, LATICACIDVEN in the last 168 hours.  Recent Results (from the past 240 hour(s))  SARS CORONAVIRUS 2 (TAT 6-24 HRS) Nasopharyngeal Nasopharyngeal Swab     Status: None   Collection Time: 11/18/19  9:27 PM   Specimen: Nasopharyngeal Swab  Result Value Ref Range Status   SARS Coronavirus 2 NEGATIVE NEGATIVE Final     Comment: (NOTE) SARS-CoV-2 target nucleic acids are NOT DETECTED. The SARS-CoV-2 RNA is generally detectable in upper and lower respiratory specimens during the acute phase of infection. Negative results do not preclude SARS-CoV-2 infection, do not rule out co-infections with other pathogens, and should not be used as the sole basis for treatment or other patient management decisions. Negative results must be combined with clinical observations, patient history, and epidemiological information. The expected result is Negative. Fact Sheet for Patients: SugarRoll.be Fact Sheet for Healthcare Providers: https://www.woods-mathews.com/ This test is not yet approved or cleared by the [place] FDA and  has been authorized for detection and/or diagnosis of SARS-CoV-2 by FDA under an Emergency Use Authorization (EUA). This EUA will remain  in effect (meaning this test can be used) for the duration of the COVID-19 declaration under Section 56 4(b)(1)  of the Act, 21 U.S.C. section 360bbb-3(b)(1), unless the authorization is terminated or revoked sooner. Performed at [facility], [place] [address] [place], [place] [address]       Imaging Studies   DG Abd 1 View  Result Date: 11/19/2019 CLINICAL DATA:  Right ureteral calculus. EXAM: ABDOMEN - 1 VIEW COMPARISON:  CT scan from yesterday. FINDINGS: The bowel gas pattern is unremarkable. The soft tissue shadows are maintained. No definite renal or right ureteral calculi are demonstrated on the radiograph. The right kidney is largely obscured by the colon. The right ureteral calculus was quite small and is not visualized for certain. The bony structures are intact. IMPRESSION: The right ureteral calculus is not visualized for certain. Electronically Signed   By: Marijo Sanes M.D.   On: 11/19/2019 11:31   CT Renal Stone Study  Result Date: 11/18/2019 CLINICAL DATA:  Severe right lower quadrant  abdominal and back pain EXAM: CT ABDOMEN AND PELVIS WITHOUT CONTRAST TECHNIQUE: Multidetector CT imaging of the abdomen and pelvis was performed following the standard protocol without IV contrast. COMPARISON:  October 12, 2019 FINDINGS: Lower chest: The visualized heart size within normal limits. No pericardial fluid/thickening. No hiatal hernia. The visualized portions of the lungs are clear. Hepatobiliary: There is a tiny low-density lesion seen in the anterior right liver lobe which is unchanged from prior exam. No evidence of calcified gallstones or biliary ductal dilatation. Pancreas:  Unremarkable.  No surrounding inflammatory changes. Spleen: Normal in size. Although limited due to the lack of intravenous contrast, normal in appearance. Adrenals/Urinary Tract: Both adrenal glands appear normal. Punctate calcifications seen in the upper pole of the right kidney and lower pole of the left kidney measuring less than 3 mm. There is a proximal right ureteral calculus measuring 3 mm causing mild right pelvicaliectasis and proximal ureterectasis. There is mild fat stranding changes seen around the proximal ureter. Stomach/Bowel: The stomach, small bowel, are normal in appearance. No inflammatory changes or obstructive findings. Scattered colonic diverticula are noted without diverticulitis. Vascular/Lymphatic: There are no enlarged abdominal or pelvic lymph nodes. Scattered aortic atherosclerotic calcifications are seen without aneurysmal dilatation. Reproductive: The prostate is unremarkable. Other: No evidence of abdominal wall mass or hernia. Musculoskeletal: No acute or significant osseous findings. IMPRESSION: 3 mm proximal right ureteral calculus causing mild right pelviectasis with periureteral stranding. Punctate nonobstructing bilateral renal calculi. Diverticulosis without diverticulitis. Aortic Atherosclerosis (ICD10-I70.0). Electronically Signed   By: Prudencio Pair M.D.   On: 11/18/2019 17:07      Medications   Scheduled Meds: . busPIRone  7.5 mg Oral BID  . citalopram  20 mg Oral Daily  . enoxaparin (LOVENOX) injection  40 mg Subcutaneous Q24H  . insulin aspart  0-9 Units Subcutaneous TID WC  . oxyCODONE  10 mg Oral Q4H  . pantoprazole  40 mg Oral Daily  . simvastatin  20 mg Oral QHS  . tamsulosin  0.4 mg Oral Daily   Continuous Infusions: . cefTRIAXone (ROCEPHIN)  IV Stopped (11/20/19 0926)       LOS: 1 day    Time spent: [place], DO [facility]   If 7PM-7AM, please contact night-coverage [email or web address] 11/20/2019, 1:45 PM

## 2019-11-21 DIAGNOSIS — N201 Calculus of ureter: Secondary | ICD-10-CM

## 2019-11-21 DIAGNOSIS — E119 Type 2 diabetes mellitus without complications: Secondary | ICD-10-CM

## 2019-11-21 LAB — GLUCOSE, CAPILLARY: Glucose-Capillary: 109 mg/dL — ABNORMAL HIGH (ref 70–99)

## 2019-11-21 MED ORDER — KETOROLAC TROMETHAMINE 10 MG PO TABS: 10.0000 mg | ORAL_TABLET | Freq: Four times a day (QID) | ORAL | 0 refills | Status: AC | PRN

## 2019-11-21 MED ORDER — TAMSULOSIN HCL 0.4 MG PO CAPS: 0.4000 mg | ORAL_CAPSULE | Freq: Every day | ORAL | 0 refills | Status: DC

## 2019-11-21 MED ORDER — OXYCODONE HCL 10 MG PO TABS: 10.0000 mg | ORAL_TABLET | ORAL | 0 refills | Status: AC

## 2019-11-21 MED ORDER — AMOXICILLIN 500 MG PO TABS: 500.0000 mg | ORAL_TABLET | Freq: Two times a day (BID) | ORAL | 0 refills | Status: AC

## 2019-11-21 NOTE — Progress Notes (Signed)
Charles Beasley  A and O x 4 VSS. Pt tolerating diet well. No complaints of pain or nausea. IV removed intact, prescriptions given. Pt voices understanding of discharge instructions with no further questions. Pt discharged via wheelchair with axillary.   Allergies as of 11/21/2019      Reactions   Montelukast Photosensitivity   Sertraline Other (See Comments)   Feels out of it, zombie      Medication List    STOP taking these medications   ibuprofen 200 MG tablet Commonly known as: ADVIL   meloxicam 7.5 MG tablet Commonly known as: MOBIC     TAKE these medications   acetaminophen 325 MG tablet Commonly known as: TYLENOL Take 2 tablets (650 mg total) by mouth every 6 (six) hours as needed for mild pain or fever (or Fever >/= 101).   albuterol 108 (90 Base) MCG/ACT inhaler Commonly known as: VENTOLIN HFA Inhale 2 puffs into the lungs every 6 (six) hours as needed. Reported on 11/08/2015   amoxicillin 500 MG tablet Commonly known as: AMOXIL Take 1 tablet (500 mg total) by mouth 2 (two) times daily for 3 days.   busPIRone 7.5 MG tablet Commonly known as: BUSPAR Take 7.5 mg by mouth 2 (two) times daily.   citalopram 20 MG tablet Commonly known as: CELEXA Take 20 mg by mouth daily.   fexofenadine 180 MG tablet Commonly known as: ALLEGRA Take 180 mg by mouth daily as needed for allergies.   glipiZIDE 5 MG 24 hr tablet Commonly known as: GLUCOTROL XL Take 5 mg by mouth daily.   ketorolac 10 MG tablet Commonly known as: TORADOL Take 1 tablet (10 mg total) by mouth every 6 (six) hours as needed for up to 5 days for severe pain.   metFORMIN 1000 MG tablet Commonly known as: GLUCOPHAGE Take 1,000 mg by mouth 2 (two) times daily.   omeprazole 20 MG capsule Commonly known as: PRILOSEC Take 20 mg by mouth 2 (two) times daily.   Oxycodone HCl 10 MG Tabs Take 1 tablet (10 mg total) by mouth every 4 (four) hours for 5 days.   simvastatin 20 MG tablet Commonly known as:  ZOCOR Take 20 mg by mouth at bedtime.   tamsulosin 0.4 MG Caps capsule Commonly known as: FLOMAX Take 1 capsule (0.4 mg total) by mouth daily.       Vitals:   11/20/19 2111 11/21/19 0543  BP: 126/84 106/77  Pulse: 77 75  Resp: 20 16  Temp: 98.1 F (36.7 C) 98.3 F (36.8 C)  SpO2: 98% 91%    Charles Beasley Charles Beasley

## 2019-11-21 NOTE — Discharge Instructions (Signed)
Monitor the rash on your back.  If it worsens call your PCP.  This could be Shingles, please monitor.  If it becomes vesicular (with any draining fluid), please keep the area covered.

## 2019-11-21 NOTE — Discharge Summary (Signed)
Physician Discharge Summary  ASHFORD MEDCALF [id number] DOB: 1975-11-24 DOA: 11/18/2019  PCP: [facility]  Admit date: 11/18/2019 Discharge date: 11/21/2019  Admitted From: home Disposition:  home  Recommendations for Outpatient Follow-up:  1. Follow up with PCP in 1-2 weeks 2. Please obtain BMP/CBC in one week 3. Please follow up with [facility] urology in 1-2 weeks  Home Health: No Equipment/Devices: None  Discharge Condition: stable CODE STATUS: full  Diet recommendation: Heart Healthy / Carb Modified   Brief/Interim Summary:   Charles L Allenis a 44 y.o.malewith history of type 2 diabetes, nephrolithiasis and BPH followed by urology at [facility], recent work-up for bladder wall thickening with unclear final diagnosis, and recently hospitalized for right-sided colitis in February 2021 who presented to the ED on 11/18/19  with a few hour history of right flank pain radiating to the right abdomen, sharp and severe in intensity associated with nausea and vomiting.  In the ED, HR 103 and WBC 14k, otherwise labs and vitals unremarkable.  CT renal stone protocol showed a 3 mm proximal right ureteral calculus, non-obstructing.  Admitted to hospitalist service with urology consulted.   Intractable pain Renal colic on right side Ureteral calculus, rightwith pelviectasis and periureteral stranding UTI (urinary tract infection) -Flomax -Pain control with PO oxycodone and toradol -IV Rocephin while admitted, to complete course with PO Amoxicillin -Appears no urine was sent for cultures so will empirically treat -Urology consulted, recommended supportive care and outpatient follow up --follow up with [facility] urology in 1-2 weeks or call sooner if needed  Rash on left lateral mid-back - unsure but this could be early Shingles developing.  Patient does report history of chickenpox.  Does not appear to be allergic or contact dermatitis. --monitor for spread beyond ink outline or development of  vesicles --follow up with PCP   Diabetes mellitus without complication - covered with sliding scale Novolog.  Resumed home regimen on discharge.  History of colitis -No acute issues during admission.  Discharge Diagnoses: Principal Problem:   Intractable pain Active Problems:   Diabetes mellitus without complication (HCC)   Renal colic on right side   Ureteral calculus, right   History of colitis   UTI (urinary tract infection)   Ureterolithiasis    Discharge Instructions   Discharge Instructions    Call MD for:  severe uncontrolled pain   Complete by: As directed    Call MD for:  temperature >100.4   Complete by: As directed    Diet - low sodium heart healthy   Complete by: As directed    Discharge instructions   Complete by: As directed    Take Flomax daily.  Make sure to drink plenty of fluids to stay well hydrated. Follow up with urology in clinic in 1-2 weeks. Take antibiotic as prescribed until completed.   Increase activity slowly   Complete by: As directed      Allergies as of 11/21/2019      Reactions   Montelukast Photosensitivity   Sertraline Other (See Comments)   Feels out of it, zombie      Medication List    STOP taking these medications   ibuprofen 200 MG tablet Commonly known as: ADVIL   meloxicam 7.5 MG tablet Commonly known as: MOBIC     TAKE these medications   acetaminophen 325 MG tablet Commonly known as: TYLENOL Take 2 tablets (650 mg total) by mouth every 6 (six) hours as needed for mild pain or fever (or Fever >/=  101).   albuterol 108 (90 Base) MCG/ACT inhaler Commonly known as: VENTOLIN HFA Inhale 2 puffs into the lungs every 6 (six) hours as needed. Reported on 11/08/2015   amoxicillin 500 MG tablet Commonly known as: AMOXIL Take 1 tablet (500 mg total) by mouth 2 (two) times daily for 3 days.   busPIRone 7.5 MG tablet Commonly known as: BUSPAR Take 7.5 mg by mouth 2 (two) times daily.   citalopram 20 MG  tablet Commonly known as: CELEXA Take 20 mg by mouth daily.   fexofenadine 180 MG tablet Commonly known as: ALLEGRA Take 180 mg by mouth daily as needed for allergies.   glipiZIDE 5 MG 24 hr tablet Commonly known as: GLUCOTROL XL Take 5 mg by mouth daily.   ketorolac 10 MG tablet Commonly known as: TORADOL Take 1 tablet (10 mg total) by mouth every 6 (six) hours as needed for up to 5 days for severe pain.   metFORMIN 1000 MG tablet Commonly known as: GLUCOPHAGE Take 1,000 mg by mouth 2 (two) times daily.   omeprazole 20 MG capsule Commonly known as: PRILOSEC Take 20 mg by mouth 2 (two) times daily.   Oxycodone HCl 10 MG Tabs Take 1 tablet (10 mg total) by mouth every 4 (four) hours for 5 days.   simvastatin 20 MG tablet Commonly known as: ZOCOR Take 20 mg by mouth at bedtime.   tamsulosin 0.4 MG Caps capsule Commonly known as: FLOMAX Take 1 capsule (0.4 mg total) by mouth daily.      Follow-up Information    [place]. Schedule an appointment as soon as possible for a visit in 1 week(s).   Contact information: [place] [address] [phone]        Johnella Moloney, MD. Schedule an appointment as soon as possible for a visit in 1 week(s).   Specialty: Urology Contact information: [address] [id number], [id number] Phys Office Bldg [place] [address] [phone]          Allergies  Allergen Reactions  . Montelukast Photosensitivity  . Sertraline Other (See Comments)    Feels out of it, zombie    Consultations:  Urology    Procedures/Studies: DG Abd 1 View  Result Date: 11/19/2019 CLINICAL DATA:  Right ureteral calculus. EXAM: ABDOMEN - 1 VIEW COMPARISON:  CT scan from yesterday. FINDINGS: The bowel gas pattern is unremarkable. The soft tissue shadows are maintained. No definite renal or right ureteral calculi are demonstrated on the radiograph. The right kidney is largely obscured by the colon. The right  ureteral calculus was quite small and is not visualized for certain. The bony structures are intact. IMPRESSION: The right ureteral calculus is not visualized for certain. Electronically Signed   By: Marijo Sanes M.D.   On: 11/19/2019 11:31   CT Renal Stone Study  Result Date: 11/18/2019 CLINICAL DATA:  Severe right lower quadrant abdominal and back pain EXAM: CT ABDOMEN AND PELVIS WITHOUT CONTRAST TECHNIQUE: Multidetector CT imaging of the abdomen and pelvis was performed following the standard protocol without IV contrast. COMPARISON:  October 12, 2019 FINDINGS: Lower chest: The visualized heart size within normal limits. No pericardial fluid/thickening. No hiatal hernia. The visualized portions of the lungs are clear. Hepatobiliary: There is a tiny low-density lesion seen in the anterior right liver lobe which is unchanged from prior exam. No evidence of calcified gallstones or biliary ductal dilatation. Pancreas:  Unremarkable.  No surrounding inflammatory changes. Spleen: Normal in size. Although  limited due to the lack of intravenous contrast, normal in appearance. Adrenals/Urinary Tract: Both adrenal glands appear normal. Punctate calcifications seen in the upper pole of the right kidney and lower pole of the left kidney measuring less than 3 mm. There is a proximal right ureteral calculus measuring 3 mm causing mild right pelvicaliectasis and proximal ureterectasis. There is mild fat stranding changes seen around the proximal ureter. Stomach/Bowel: The stomach, small bowel, are normal in appearance. No inflammatory changes or obstructive findings. Scattered colonic diverticula are noted without diverticulitis. Vascular/Lymphatic: There are no enlarged abdominal or pelvic lymph nodes. Scattered aortic atherosclerotic calcifications are seen without aneurysmal dilatation. Reproductive: The prostate is unremarkable. Other: No evidence of abdominal wall mass or hernia. Musculoskeletal: No acute or significant  osseous findings. IMPRESSION: 3 mm proximal right ureteral calculus causing mild right pelviectasis with periureteral stranding. Punctate nonobstructing bilateral renal calculi. Diverticulosis without diverticulitis. Aortic Atherosclerosis (ICD10-I70.0). Electronically Signed   By: Prudencio Pair M.D.   On: 11/18/2019 17:07       Subjective: Patient seen this AM.  Reports having some right flank pain/spasms still but getting better.  No fever/chills or other complaints.  Pain controlled on current oral regimen.  Patient agreeable with plan to discharge and follow with his urologist.   Discharge Exam: Vitals:   11/20/19 2111 11/21/19 0543  BP: 126/84 106/77  Pulse: 77 75  Resp: 20 16  Temp: 98.1 F (36.7 C) 98.3 F (36.8 C)  SpO2: 98% 91%   Vitals:   11/20/19 0529 11/20/19 1212 11/20/19 2111 11/21/19 0543  BP: 120/84 124/81 126/84 106/77  Pulse: 75 75 77 75  Resp: 16 18 20 16   Temp: (!) 97.5 F (36.4 C) 98 F (36.7 C) 98.1 F (36.7 C) 98.3 F (36.8 C)  TempSrc: Oral Oral Oral Oral  SpO2: 97% 99% 98% 91%  Weight:      Height:        General: Pt is alert, awake, not in acute distress Cardiovascular: RRR, S1/S2 +, no rubs, no gallops Respiratory: CTA bilaterally, no wheezing, no rhonchi Abdominal: Soft, NT, ND, bowel sounds + Extremities: no edema, no cyanosis    The results of significant diagnostics from this hospitalization (including imaging, microbiology, ancillary and laboratory) are listed below for reference.     Microbiology: Recent Results (from the past 240 hour(s))  SARS CORONAVIRUS 2 (TAT 6-24 HRS) Nasopharyngeal Nasopharyngeal Swab     Status: None   Collection Time: 11/18/19  9:27 PM   Specimen: Nasopharyngeal Swab  Result Value Ref Range Status   SARS Coronavirus 2 NEGATIVE NEGATIVE Final    Comment: (NOTE) SARS-CoV-2 target nucleic acids are NOT DETECTED. The SARS-CoV-2 RNA is generally detectable in upper and lower respiratory specimens during the  acute phase of infection. Negative results do not preclude SARS-CoV-2 infection, do not rule out co-infections with other pathogens, and should not be used as the sole basis for treatment or other patient management decisions. Negative results must be combined with clinical observations, patient history, and epidemiological information. The expected result is Negative. Fact Sheet for Patients: SugarRoll.be Fact Sheet for Healthcare Providers: https://www.woods-mathews.com/ This test is not yet approved or cleared by the [place] FDA and  has been authorized for detection and/or diagnosis of SARS-CoV-2 by FDA under an Emergency Use Authorization (EUA). This EUA will remain  in effect (meaning this test can be used) for the duration of the COVID-19 declaration under Section 56 4(b)(1) of the Act, 21 U.S.C. section 360bbb-3(b)(1), unless  the authorization is terminated or revoked sooner. Performed at [facility], [place] [address], [place], [place] [address]      Labs: BNP (last 3 results) No results for input(s): BNP in the last 8760 hours. Basic Metabolic Panel: Recent Labs  Lab 11/18/19 1624  NA 138  K 4.0  CL 107  CO2 21*  GLUCOSE 161*  BUN 15  CREATININE 0.75  CALCIUM 9.9   Liver Function Tests: Recent Labs  Lab 11/18/19 1624  AST 20  ALT 20  ALKPHOS 66  BILITOT 0.6  PROT 8.0  ALBUMIN 4.6   Recent Labs  Lab 11/18/19 1624  LIPASE 26   No results for input(s): AMMONIA in the last 168 hours. CBC: Recent Labs  Lab 11/18/19 1624 11/20/19 0513  WBC 14.0* 9.6  NEUTROABS  --  4.7  HGB 14.5 12.5*  HCT 41.5 38.1*  MCV 89.1 93.8  PLT 423* 350   Cardiac Enzymes: No results for input(s): CKTOTAL, CKMB, CKMBINDEX, TROPONINI in the last 168 hours. BNP: Invalid input(s): POCBNP CBG: Recent Labs  Lab 11/20/19 0800 11/20/19 1210 11/20/19 1643 11/20/19 2113 11/21/19 0814  GLUCAP 119* 183* 182* 196* 109*    D-Dimer No results for input(s): DDIMER in the last 72 hours. Hgb A1c Recent Labs    11/18/19 1624  HGBA1C 8.1*   Lipid Profile No results for input(s): CHOL, HDL, LDLCALC, TRIG, CHOLHDL, LDLDIRECT in the last 72 hours. Thyroid function studies No results for input(s): TSH, T4TOTAL, T3FREE, THYROIDAB in the last 72 hours.  Invalid input(s): FREET3 Anemia work up No results for input(s): VITAMINB12, FOLATE, FERRITIN, TIBC, IRON, RETICCTPCT in the last 72 hours. Urinalysis    Component Value Date/Time   COLORURINE YELLOW (A) 11/18/2019 1624   APPEARANCEUR CLOUDY (A) 11/18/2019 1624   APPEARANCEUR Cloudy (A) 10/29/2019 1512   LABSPEC 1.025 11/18/2019 1624   PHURINE 7.0 11/18/2019 1624   GLUCOSEU 50 (A) 11/18/2019 1624   HGBUR LARGE (A) 11/18/2019 1624   BILIRUBINUR NEGATIVE 11/18/2019 1624   BILIRUBINUR Negative 10/29/2019 1512   KETONESUR NEGATIVE 11/18/2019 1624   PROTEINUR 100 (A) 11/18/2019 1624   NITRITE NEGATIVE 11/18/2019 1624   LEUKOCYTESUR NEGATIVE 11/18/2019 1624   Sepsis Labs Invalid input(s): PROCALCITONIN,  WBC,  LACTICIDVEN Microbiology Recent Results (from the past 240 hour(s))  SARS CORONAVIRUS 2 (TAT 6-24 HRS) Nasopharyngeal Nasopharyngeal Swab     Status: None   Collection Time: 11/18/19  9:27 PM   Specimen: Nasopharyngeal Swab  Result Value Ref Range Status   SARS Coronavirus 2 NEGATIVE NEGATIVE Final    Comment: (NOTE) SARS-CoV-2 target nucleic acids are NOT DETECTED. The SARS-CoV-2 RNA is generally detectable in upper and lower respiratory specimens during the acute phase of infection. Negative results do not preclude SARS-CoV-2 infection, do not rule out co-infections with other pathogens, and should not be used as the sole basis for treatment or other patient management decisions. Negative results must be combined with clinical observations, patient history, and epidemiological information. The expected result is Negative. Fact Sheet for  Patients: SugarRoll.be Fact Sheet for Healthcare Providers: https://www.woods-mathews.com/ This test is not yet approved or cleared by the [place] FDA and  has been authorized for detection and/or diagnosis of SARS-CoV-2 by FDA under an Emergency Use Authorization (EUA). This EUA will remain  in effect (meaning this test can be used) for the duration of the COVID-19 declaration under Section 56 4(b)(1) of the Act, 21 U.S.C. section 360bbb-3(b)(1), unless the authorization is terminated or revoked  sooner. Performed at [facility], [place] [address] [place], [place] [address]      Time coordinating discharge: Over 30 minutes  SIGNED:   Ezekiel Slocumb, DO [facility] 11/21/2019, 8:51 AM   If 7PM-7AM, please contact night-coverage [email or web address]

## 2020-01-12 ENCOUNTER — Emergency Department
Admission: EM | Admit: 2020-01-12 | Discharge: 2020-01-12 | Disposition: A | Discharge status: Home / Self Care | Payer: PRIVATE HEALTH INSURANCE | Attending: Student | Admitting: Student

## 2020-01-12 ENCOUNTER — Emergency Department: Payer: PRIVATE HEALTH INSURANCE

## 2020-01-12 ENCOUNTER — Encounter: Payer: Self-pay | Admitting: Emergency Medicine

## 2020-01-12 ENCOUNTER — Other Ambulatory Visit: Payer: Self-pay

## 2020-01-12 DIAGNOSIS — R109 Unspecified abdominal pain: Secondary | ICD-10-CM

## 2020-01-12 DIAGNOSIS — Z7984 Long term (current) use of oral hypoglycemic drugs: Secondary | ICD-10-CM | POA: Insufficient documentation

## 2020-01-12 DIAGNOSIS — E119 Type 2 diabetes mellitus without complications: Secondary | ICD-10-CM | POA: Diagnosis not present

## 2020-01-12 DIAGNOSIS — Z8551 Personal history of malignant neoplasm of bladder: Secondary | ICD-10-CM | POA: Diagnosis not present

## 2020-01-12 DIAGNOSIS — Z79899 Other long term (current) drug therapy: Secondary | ICD-10-CM | POA: Diagnosis not present

## 2020-01-12 DIAGNOSIS — Z87891 Personal history of nicotine dependence: Secondary | ICD-10-CM | POA: Diagnosis not present

## 2020-01-12 DIAGNOSIS — R1031 Right lower quadrant pain: Secondary | ICD-10-CM | POA: Insufficient documentation

## 2020-01-12 LAB — URINALYSIS, COMPLETE (UACMP) WITH MICROSCOPIC
Bacteria, UA: NONE SEEN
Bilirubin Urine: NEGATIVE
Glucose, UA: NEGATIVE mg/dL
Hgb urine dipstick: NEGATIVE
Ketones, ur: NEGATIVE mg/dL
Leukocytes,Ua: NEGATIVE
Nitrite: NEGATIVE
Protein, ur: NEGATIVE mg/dL
Specific Gravity, Urine: 1.02 (ref 1.005–1.030)
Squamous Epithelial / HPF: NONE SEEN (ref 0–5)
pH: 5 (ref 5.0–8.0)

## 2020-01-12 LAB — BASIC METABOLIC PANEL
Anion gap: 10 (ref 5–15)
BUN: 15 mg/dL (ref 6–20)
CO2: 24 mmol/L (ref 22–32)
Calcium: 9.2 mg/dL (ref 8.9–10.3)
Chloride: 105 mmol/L (ref 98–111)
Creatinine, Ser: 0.79 mg/dL (ref 0.61–1.24)
GFR calc Af Amer: >60 mL/min (ref >60)
GFR calc non Af Amer: >60 mL/min (ref >60)
Glucose, Bld: 98 mg/dL (ref 70–99)
Potassium: 3.7 mmol/L (ref 3.5–5.1)
Sodium: 139 mmol/L (ref 135–145)

## 2020-01-12 LAB — CBC
HCT: 41.1 % (ref 39.0–52.0)
Hemoglobin: 14.4 g/dL (ref 13.0–17.0)
MCH: 30.8 pg (ref 26.0–34.0)
MCHC: 35 g/dL (ref 30.0–36.0)
MCV: 87.8 fL (ref 80.0–100.0)
Platelets: 407 10*3/uL — ABNORMAL HIGH (ref 150–400)
RBC: 4.68 MIL/uL (ref 4.22–5.81)
RDW: 13.1 % (ref 11.5–15.5)
WBC: 12.2 10*3/uL — ABNORMAL HIGH (ref 4.0–10.5)
nRBC: 0 % (ref 0.0–0.2)

## 2020-01-12 MED ORDER — MORPHINE SULFATE (PF) 4 MG/ML IV SOLN
4.0000 mg | Freq: Once | INTRAVENOUS | Status: AC
  Administered 2020-01-12: 4 mg via INTRAVENOUS
  Filled 2020-01-12: qty 1

## 2020-01-12 MED ORDER — KETOROLAC TROMETHAMINE 30 MG/ML IJ SOLN
30.0000 mg | Freq: Once | INTRAMUSCULAR | Status: AC
  Administered 2020-01-12: 30 mg via INTRAVENOUS

## 2020-01-12 MED ORDER — OXYCODONE HCL 5 MG PO TABS
5.0000 mg | ORAL_TABLET | Freq: Once | ORAL | Status: AC
  Administered 2020-01-12: 5 mg via ORAL
  Filled 2020-01-12: qty 1

## 2020-01-12 MED ORDER — IOHEXOL 300 MG/ML  SOLN
100.0000 mL | Freq: Once | INTRAMUSCULAR | Status: AC | PRN
  Administered 2020-01-12: 100 mL via INTRAVENOUS
  Filled 2020-01-12: qty 100

## 2020-01-12 MED ORDER — KETOROLAC TROMETHAMINE 30 MG/ML IJ SOLN
INTRAMUSCULAR | Status: AC
  Filled 2020-01-12: qty 1

## 2020-01-12 MED ORDER — ONDANSETRON HCL 4 MG/2ML IJ SOLN
4.0000 mg | Freq: Once | INTRAMUSCULAR | Status: AC
  Administered 2020-01-12: 4 mg via INTRAVENOUS
  Filled 2020-01-12: qty 2

## 2020-01-12 NOTE — ED Provider Notes (Signed)
[facility] Emergency Department Provider Note  ____________________________________________   First MD Initiated Contact with Patient 01/12/20 1920     (approximate)  I have reviewed the triage vital signs and the nursing notes.  History  Chief Complaint Flank Pain    HPI Charles Beasley is a 44 y.o. male has medical history as below who presents to the emergency department for abdominal pain.  Patient states he has had ongoing issues with right-sided abdominal pain for several years.  He has a history of nephrolithiasis and colitis.  He states recurrence of this pain started last night, and has been constant and progressively worsening since onset.  Located in the RLQ.  Describes it as a burning sensation, 10/10 in severity.  No alleviating or aggravating components.  No radiation.  Associated with nausea, no vomiting.  Has had increased loose stools and some burning with bowel movements.  No blood or melena.  No fevers.  Has not eaten anything new or different.   Past Medical Hx Past Medical History:  Diagnosis Date  . Asthma   . Bladder cancer ([place])   . Colitis   . Diabetes mellitus without complication ([place])   . Diverticulosis   . History of kidney stones   . IBS (irritable bowel syndrome)     Problem List Patient Active Problem List   Diagnosis Date Noted  . Ureterolithiasis 11/19/2019  . Intractable pain 11/18/2019  . Renal colic on right side 60/04/9322  . Ureteral calculus, right 11/18/2019  . History of colitis 11/18/2019  . UTI (urinary tract infection) 11/18/2019  . Abnormal urine cytology 09/30/2019  . Colitis 09/06/2019  . Diabetes mellitus without complication ([place])   . Asthma     Past Surgical Hx History reviewed. No pertinent surgical history.  Medications Prior to Admission medications   Medication Sig Start Date End Date Taking? Authorizing Provider  acetaminophen (TYLENOL) 325 MG tablet Take 2 tablets (650 mg total) by  mouth every 6 (six) hours as needed for mild pain or fever (or Fever >/= 101). 09/10/19   Wyvonnia Dusky, MD  albuterol (PROVENTIL HFA;VENTOLIN HFA) 108 (90 BASE) MCG/ACT inhaler Inhale 2 puffs into the lungs every 6 (six) hours as needed. Reported on 11/08/2015    [provider]  busPIRone (BUSPAR) 7.5 MG tablet Take 7.5 mg by mouth 2 (two) times daily. 08/29/19   [provider]  citalopram (CELEXA) 20 MG tablet Take 20 mg by mouth daily. 11/05/19   [provider]  fexofenadine (ALLEGRA) 180 MG tablet Take 180 mg by mouth daily as needed for allergies.     [provider]  glipiZIDE (GLUCOTROL XL) 5 MG 24 hr tablet Take 5 mg by mouth daily. 09/02/19   [provider]  metFORMIN (GLUCOPHAGE) 1000 MG tablet Take 1,000 mg by mouth 2 (two) times daily. 08/19/19   [provider]  omeprazole (PRILOSEC) 20 MG capsule Take 20 mg by mouth 2 (two) times daily. 08/25/19   [provider]  simvastatin (ZOCOR) 20 MG tablet Take 20 mg by mouth at bedtime. 08/16/19   [provider]  tamsulosin (FLOMAX) 0.4 MG CAPS capsule Take 1 capsule (0.4 mg total) by mouth daily. 11/21/19   Ezekiel Slocumb, DO    Allergies Montelukast and Sertraline  Family Hx Family History  Problem Relation Age of Onset  . Breast cancer Mother   . Hypertension Father   . Prostate cancer Neg Hx   . Bladder Cancer Neg  Hx   . Kidney cancer Neg Hx     Social Hx Social History   Tobacco Use  . Smoking status: Former Smoker    Types: Cigarettes  . Smokeless tobacco: Never Used  Vaping Use  . Vaping Use: Every day  Substance Use Topics  . Alcohol use: No    Alcohol/week: 0.0 standard drinks  . Drug use: No     Review of Systems  Constitutional: Negative for fever. Negative for chills. Eyes: Negative for visual changes. ENT: Negative for sore throat. Cardiovascular: Negative for chest pain. Respiratory: Negative for shortness of  breath. Gastrointestinal: Positive for abdominal pain, nausea. Genitourinary: Negative for dysuria. Musculoskeletal: Negative for leg swelling. Skin: Negative for rash. Neurological: Negative for headaches.   Physical Exam  Vital Signs: ED Triage Vitals  Enc Vitals Group     BP 01/12/20 1539 122/84     Pulse Rate 01/12/20 1539 96     Resp 01/12/20 1539 18     Temp 01/12/20 1539 98.8 F (37.1 C)     Temp Source 01/12/20 1539 Oral     SpO2 01/12/20 1539 97 %     Weight 01/12/20 1539 156 lb (70.8 kg)     Height 01/12/20 1539 5\' 9"  (1.753 m)     Head Circumference --      Peak Flow --      Pain Score 01/12/20 1555 10     Pain Loc --      Pain Edu? --      Excl. in [place]? --     Constitutional: Alert and oriented. Well appearing. NAD.  Head: Normocephalic. Atraumatic. Eyes: Conjunctivae clear. Sclera anicteric. Pupils equal and symmetric. Nose: No masses or lesions. No congestion or rhinorrhea. Mouth/Throat: Wearing mask.  Neck: No stridor. Trachea midline.  Cardiovascular: Normal rate, regular rhythm. Extremities well perfused. Respiratory: Normal respiratory effort.  Lungs CTAB. Gastrointestinal: Soft.  TTP in RLQ.  No rebound, guarding, rigidity Genitourinary: Deferred. Musculoskeletal: No lower extremity edema. No deformities. Neurologic:  Normal speech and language. No gross focal or lateralizing neurologic deficits are appreciated.  Skin: Skin is warm, dry and intact. No rash noted. Psychiatric: Mood and affect are appropriate for situation.   Radiology  Personally reviewed available imaging myself.   CT A/P - IMPRESSION:  1. No acute abnormality in the abdomen/pelvis. Normal appendix.  2. Previous right proximal ureteral calculus has passed. No  hydronephrosis or obstructive uropathy.  3. Nonobstructing left renal stone.  4. Colonic diverticulosis without diverticulitis.  5. Hepatic steatosis.    Procedures  Procedure(s) performed (including critical  care):  Procedures   Initial Impression / Assessment and Plan / MDM / ED Course  44 y.o. male who presents to the ED for RLQ abdominal pain and nausea  Ddx: appendicitis, colitis, nephroureterolithiasis, UTI  Will plan for labs, imaging, pain control and reassess  Work-up unremarkable.  Urine without evidence of infection.  Mild, nonspecific leukocytosis at 12.  Remainder of labs without actionable derangements.  CT without acute abnormalities, prior ureteral calculus is passed.  Patient's pain improved with interventions.  As such, feel he is stable for discharge with outpatient follow-up.  Given information for urology here as he has had trouble scheduling with [facility].  Otherwise, advised PCP follow-up and given return precautions.   _______________________________   As part of my medical decision making I have reviewed available labs, radiology tests, reviewed old records/performed chart review.    Final Clinical Impression(s) / ED Diagnosis  RLQ abdominal  pain Acute on chronic abdominal pain Nausea    Note:  This document was prepared using Dragon voice recognition software and may include unintentional dictation errors.   Lilia Pro., MD 01/12/20 [phone]

## 2020-01-12 NOTE — ED Notes (Signed)
E-signature not working at this time. Pt verbalized understanding of D/C instructions, prescriptions and follow up care with no further questions at this time. Pt in NAD and ambulatory at time of D/C.  

## 2020-01-12 NOTE — ED Triage Notes (Signed)
Pt from home with continued right flank pain. Pt states he has had this problem for 2 years; was diagnosed with bladder cancer, was told he did not have bladder cancer, has had kidney stones, colitis; is waiting to have appointment with urologist. Pt states his symptoms are pain in right lower quadrant, nausea, shakes. Pt alert & oriented, nad noted.

## 2020-01-12 NOTE — Discharge Instructions (Signed)
Thank you for letting us take care of you in the emergency department today.   Please continue to take any regular, prescribed medications.   Please follow up with: Urology, information below  Please return to the ER for any new or worsening symptoms.

## 2020-01-13 ENCOUNTER — Ambulatory Visit (INDEPENDENT_AMBULATORY_CARE_PROVIDER_SITE_OTHER): Payer: PRIVATE HEALTH INSURANCE | Admitting: Urology

## 2020-01-13 ENCOUNTER — Encounter: Payer: Self-pay | Admitting: Medical Oncology

## 2020-01-13 ENCOUNTER — Other Ambulatory Visit: Payer: Self-pay

## 2020-01-13 ENCOUNTER — Encounter: Payer: Self-pay | Admitting: Urology

## 2020-01-13 VITALS — BP 117/80 | HR 94 | Ht 69.0 in | Wt 156.0 lb

## 2020-01-13 DIAGNOSIS — J45909 Unspecified asthma, uncomplicated: Secondary | ICD-10-CM | POA: Insufficient documentation

## 2020-01-13 DIAGNOSIS — Z87891 Personal history of nicotine dependence: Secondary | ICD-10-CM | POA: Diagnosis not present

## 2020-01-13 DIAGNOSIS — Z79899 Other long term (current) drug therapy: Secondary | ICD-10-CM | POA: Diagnosis not present

## 2020-01-13 DIAGNOSIS — R1031 Right lower quadrant pain: Secondary | ICD-10-CM | POA: Insufficient documentation

## 2020-01-13 DIAGNOSIS — N200 Calculus of kidney: Secondary | ICD-10-CM

## 2020-01-13 DIAGNOSIS — R1084 Generalized abdominal pain: Secondary | ICD-10-CM | POA: Diagnosis not present

## 2020-01-13 DIAGNOSIS — E119 Type 2 diabetes mellitus without complications: Secondary | ICD-10-CM | POA: Diagnosis not present

## 2020-01-13 DIAGNOSIS — Z7984 Long term (current) use of oral hypoglycemic drugs: Secondary | ICD-10-CM | POA: Insufficient documentation

## 2020-01-13 LAB — CBC
HCT: 41.1 % (ref 39.0–52.0)
Hemoglobin: 13.9 g/dL (ref 13.0–17.0)
MCH: 31 pg (ref 26.0–34.0)
MCHC: 33.8 g/dL (ref 30.0–36.0)
MCV: 91.7 fL (ref 80.0–100.0)
Platelets: 410 K/uL — ABNORMAL HIGH (ref 150–400)
RBC: 4.48 MIL/uL (ref 4.22–5.81)
RDW: 13 % (ref 11.5–15.5)
WBC: 16.5 K/uL — ABNORMAL HIGH (ref 4.0–10.5)
nRBC: 0 % (ref 0.0–0.2)

## 2020-01-13 LAB — BASIC METABOLIC PANEL
Anion gap: 9 (ref 5–15)
BUN: 17 mg/dL (ref 6–20)
CO2: 24 mmol/L (ref 22–32)
Calcium: 9 mg/dL (ref 8.9–10.3)
Chloride: 105 mmol/L (ref 98–111)
Creatinine, Ser: 0.92 mg/dL (ref 0.61–1.24)
GFR calc Af Amer: >60 mL/min (ref >60)
GFR calc non Af Amer: >60 mL/min (ref >60)
Glucose, Bld: 254 mg/dL — ABNORMAL HIGH (ref 70–99)
Potassium: 4.1 mmol/L (ref 3.5–5.1)
Sodium: 138 mmol/L (ref 135–145)

## 2020-01-13 LAB — URINE CULTURE: Culture: <10000 — AB

## 2020-01-13 LAB — LIPASE, BLOOD: Lipase: 29 U/L (ref 11–51)

## 2020-01-13 NOTE — ED Triage Notes (Signed)
Pt reports continued rt sided flank pain that pt was seen for yesterday in ED for and referred to urology, pt seen urology today and was told to come back to ED for pain management and possible colitis. Pt was also referred to GI. Pt in NAD at this time.

## 2020-01-13 NOTE — Progress Notes (Signed)
   01/13/2020 4:32 PM   Charles Beasley 05/08/1976 [id number]  Reason for visit: Abdominal pain  HPI: I saw Charles Beasley for evaluation of abdominal pain.  To briefly summarize his history, he is a 44 year old male with history of multiple ER visits for abdominal pain, as well as history of nephrolithiasis.  He has been seen by urology at both [facility] and [place].  He underwent left ureteroscopy and laser lithotripsy with stent placement at [facility] for a 5 mm left ureteral stone on 05/08/2019, and stent was not removed until 08/04/2019.  There was some erythema at time of stent removal.  He also had a CT with some bladder wall thickening.  He underwent a cystoscopy with Dr. Thurmond Butts at [facility] on 08/14/2019 which was essentially normal aside from some very mild erythema at the trigone.  Cytology was sent and was atypical.  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  He then was seen at [facility] on 09/12/2019 and underwent another cystoscopy that was reportedly completely normal with no erythema or abnormalities.  Cytology sent from that bladder wash was negative.    He was admitted on 11/19/2019 to [facility] ED with a 3 mm right ureteral stone that ultimately passed spontaneously.  He has not tolerated stents well in the past.  Most recently, he was seen yesterday in the ED with right-sided abdominal pain and extensive work-up was performed.  He also complains of nausea/vomiting, and constipation.  Urinalysis was completely benign, labs were reassuring, and CT was negative.  There was no hydronephrosis or ureteral stones seen, and bladder was decompressed.  I personally reviewed the films with the patient today showing no urologic pathology.  I do not have any urologic cause for his abdominal pain.  He has had multiple ED visits in the past with abdominal pain and negative CT.  I recommended referral to gastroenterology for  consideration of colonoscopy and better management of his abdominal pain with unclear etiology.  I spent 30 total minutes on the day of the encounter including pre-visit review of the medical record, face-to-face time with the patient, and post visit ordering of labs/imaging/tests.  B[place]ley Co, [facility] [address] [place], [place] [address] [phone]

## 2020-01-14 ENCOUNTER — Emergency Department: Payer: PRIVATE HEALTH INSURANCE

## 2020-01-14 ENCOUNTER — Other Ambulatory Visit: Payer: Self-pay

## 2020-01-14 ENCOUNTER — Ambulatory Visit (INDEPENDENT_AMBULATORY_CARE_PROVIDER_SITE_OTHER): Payer: PRIVATE HEALTH INSURANCE | Admitting: Gastroenterology

## 2020-01-14 ENCOUNTER — Emergency Department
Admission: EM | Admit: 2020-01-14 | Discharge: 2020-01-14 | Disposition: A | Discharge status: Home / Self Care | Payer: PRIVATE HEALTH INSURANCE | Attending: Emergency Medicine | Admitting: Emergency Medicine

## 2020-01-14 ENCOUNTER — Encounter: Payer: Self-pay | Admitting: Gastroenterology

## 2020-01-14 VITALS — BP 107/69 | HR 94 | Temp 98.3°F | Ht 69.0 in | Wt 158.4 lb

## 2020-01-14 DIAGNOSIS — R1084 Generalized abdominal pain: Secondary | ICD-10-CM

## 2020-01-14 DIAGNOSIS — D72829 Elevated white blood cell count, unspecified: Secondary | ICD-10-CM | POA: Diagnosis not present

## 2020-01-14 DIAGNOSIS — Z1211 Encounter for screening for malignant neoplasm of colon: Secondary | ICD-10-CM

## 2020-01-14 DIAGNOSIS — R1031 Right lower quadrant pain: Secondary | ICD-10-CM

## 2020-01-14 MED ORDER — GADOBUTROL 1 MMOL/ML IV SOLN
7.0000 mL | Freq: Once | INTRAVENOUS | Status: AC | PRN
  Administered 2020-01-14: 7 mL via INTRAVENOUS

## 2020-01-14 MED ORDER — MORPHINE SULFATE (PF) 4 MG/ML IV SOLN
4.0000 mg | Freq: Once | INTRAVENOUS | Status: AC
  Administered 2020-01-14: 4 mg via INTRAVENOUS
  Filled 2020-01-14: qty 1

## 2020-01-14 MED ORDER — OXYCODONE-ACETAMINOPHEN 5-325 MG PO TABS: 1.0000 | ORAL_TABLET | ORAL | 0 refills | Status: DC | PRN

## 2020-01-14 MED ORDER — HYDROMORPHONE HCL 1 MG/ML IJ SOLN
1.0000 mg | Freq: Once | INTRAMUSCULAR | Status: AC
  Administered 2020-01-14: 1 mg via INTRAVENOUS
  Filled 2020-01-14: qty 1

## 2020-01-14 MED ORDER — ONDANSETRON HCL 4 MG/2ML IJ SOLN
4.0000 mg | Freq: Once | INTRAMUSCULAR | Status: AC
  Administered 2020-01-14: 4 mg via INTRAVENOUS
  Filled 2020-01-14: qty 2

## 2020-01-14 MED ORDER — SODIUM CHLORIDE 0.9 % IV BOLUS
1000.0000 mL | Freq: Once | INTRAVENOUS | Status: AC
  Administered 2020-01-14: 1000 mL via INTRAVENOUS

## 2020-01-14 NOTE — ED Provider Notes (Signed)
[facility] Emergency Department Provider Note  ____________________________________________   First MD Initiated Contact with Patient 01/14/20 0040     (approximate)  I have reviewed the triage vital signs and the nursing notes.   HISTORY  Chief Complaint Abdominal Pain and Nausea    HPI Charles Beasley is a 44 y.o. male with below list of chronic medical conditions including nephrolithiasis diverticulosis kidney stones returns to the emergency department secondary to right lower quadrant pain.  Patient was seen in the emergency department on 01/12/2020 secondary to the same.  Patient subsequently followed up with urology who referred the patient to return to the emergency department for pain control and evaluation for colitis.  Patient currently admits to 10 out of 10 right lower quadrant abdominal pain.  Patient denies any nausea vomiting diarrhea constipation.  Patient denies any fever afebrile on presentation.        Past Medical History:  Diagnosis Date  . Asthma   . Bladder cancer ([place])   . Colitis   . Diabetes mellitus without complication ([place])   . Diverticulosis   . History of kidney stones   . IBS (irritable bowel syndrome)     Patient Active Problem List   Diagnosis Date Noted  . Ureterolithiasis 11/19/2019  . Intractable pain 11/18/2019  . Renal colic on right side 53/61/4431  . Ureteral calculus, right 11/18/2019  . History of colitis 11/18/2019  . UTI (urinary tract infection) 11/18/2019  . Abnormal urine cytology 09/30/2019  . Colitis 09/06/2019  . Diabetes mellitus without complication ([place])   . Asthma     History reviewed. No pertinent surgical history.  Prior to Admission medications   Medication Sig Start Date End Date Taking? Authorizing Provider  ACCU-CHEK GUIDE test strip 2 (two) times daily. for testing as directed 11/05/19   [provider]  acetaminophen (TYLENOL) 325 MG tablet Take 2 tablets (650 mg total)  by mouth every 6 (six) hours as needed for mild pain or fever (or Fever >/= 101). 09/10/19   Wyvonnia Dusky, MD  albuterol (PROVENTIL HFA;VENTOLIN HFA) 108 (90 BASE) MCG/ACT inhaler Inhale 2 puffs into the lungs every 6 (six) hours as needed. Reported on 11/08/2015    [provider]  busPIRone (BUSPAR) 7.5 MG tablet Take 7.5 mg by mouth 2 (two) times daily. 08/29/19   [provider]  citalopram (CELEXA) 20 MG tablet Take 20 mg by mouth daily. 11/05/19   [provider]  glipiZIDE (GLUCOTROL XL) 5 MG 24 hr tablet Take 5 mg by mouth daily. 09/02/19   [provider]  metFORMIN (GLUCOPHAGE) 1000 MG tablet Take 1,000 mg by mouth 2 (two) times daily. 08/19/19   [provider]  omeprazole (PRILOSEC) 20 MG capsule Take 20 mg by mouth 2 (two) times daily. 08/25/19   [provider]  oxyCODONE-acetaminophen (PERCOCET) 5-325 MG tablet Take 1 tablet by mouth every 4 (four) hours as needed. 01/14/20 01/13/21  Gregor Hams, MD  pantoprazole (PROTONIX) 40 MG tablet Take 40 mg by mouth daily. 11/24/19   [provider]  simvastatin (ZOCOR) 20 MG tablet Take 20 mg by mouth at bedtime. 08/16/19   [provider]    Allergies Montelukast and Sertraline  Family History  Problem Relation Age of Onset  . Breast cancer Mother   . Hypertension Father   . Prostate cancer Neg Hx   . Bladder Cancer Neg Hx   . Kidney cancer Neg Hx  Social History Social History   Tobacco Use  . Smoking status: Former Smoker    Types: Cigarettes  . Smokeless tobacco: Never Used  Vaping Use  . Vaping Use: Every day  Substance Use Topics  . Alcohol use: No    Alcohol/week: 0.0 standard drinks  . Drug use: No    Review of Systems Constitutional: No fever/chills Eyes: No visual changes. ENT: No sore throat. Cardiovascular: Denies chest pain. Respiratory: Denies shortness of breath. Gastrointestinal: Positive for abdominal pain.  No nausea, no  vomiting.  No diarrhea.  No constipation. Genitourinary: Negative for dysuria. Musculoskeletal: Negative for neck pain.  Negative for back pain. Integumentary: Negative for rash. Neurological: Negative for headaches, focal weakness or numbness.   ____________________________________________   PHYSICAL EXAM:  VITAL SIGNS: ED Triage Vitals  Enc Vitals Group     BP 01/13/20 1823 127/78     Pulse Rate 01/13/20 1823 99     Resp 01/13/20 1823 18     Temp 01/13/20 1823 98.7 F (37.1 C)     Temp Source 01/13/20 1823 Oral     SpO2 01/13/20 1823 96 %     Weight 01/13/20 1824 70 kg (154 lb 5.2 oz)     Height 01/13/20 1824 1.753 m (5\' 9" )     Head Circumference --      Peak Flow --      Pain Score 01/13/20 1824 10     Pain Loc --      Pain Edu? --      Excl. in [place]? --     Constitutional: Alert and oriented.  Eyes: Conjunctivae are normal.  Mouth/Throat: Patient is wearing a mask. Neck: No stridor.  No meningeal signs.   Cardiovascular: Normal rate, regular rhythm. Good peripheral circulation. Grossly normal heart sounds. Respiratory: Normal respiratory effort.  No retractions. Gastrointestinal: Soft and nontender. No distention.  Musculoskeletal: No lower extremity tenderness nor edema. No gross deformities of extremities. Neurologic:  Normal speech and language. No gross focal neurologic deficits are appreciated.  Skin:  Skin is warm, dry and intact. Psychiatric: Mood and affect are normal. Speech and behavior are normal.  ____________________________________________   LABS (all labs ordered are listed, but only abnormal results are displayed)  Labs Reviewed  CBC - Abnormal; Notable for the following components:      Result Value   WBC 16.5 (*)    Platelets 410 (*)    All other components within normal limits  BASIC METABOLIC PANEL - Abnormal; Notable for the following components:   Glucose, Bld 254 (*)    All other components within normal limits  LIPASE, BLOOD    _____  RADIOLOGY I, [place], personally viewed and evaluated these images (plain radiographs) as part of my medical decision making, as well as reviewing the written report by the radiologist.  ED MD interpretation: Received a call from the radiology stating that the MRI of the abdomen pelvis was negative.  Official radiology report(s): No results found.  _______________________________________ Procedures   ____________________________________________   INITIAL IMPRESSION / MDM / ASSESSMENT AND PLAN / ED COURSE  As part of my medical decision making, I reviewed the following data within the electronic MEDICAL RECORD NUMBER   44 year old male presented with above-stated history and physical exam a differential diagnosis including but not limited to appendicitis, inflammatory bowel disease, colitis, ureterolithiasis nephrolithiasis.  Laboratory data notable for white blood cell count of 16.5.  Patient underwent a CT scan of the abdomen pelvis 2  days ago and as such MRI of the abdomen pelvis performed today to limit radiation.  I was notified by the radiologist that the MRI of the abdomen pelvis was negative.  Patient was given IV morphine 4 mg with no improvement of pain and as such was subsequently given 1 mg of IV Dilaudid.  No clear etiology for the patient's abdominal pain identified as such patient will be referred to gastroenterology Dr. Bonna Gains.  ____________________________________________  FINAL CLINICAL IMPRESSION(S) / ED DIAGNOSES  Final diagnoses:  Right lower quadrant abdominal pain     MEDICATIONS GIVEN DURING THIS VISIT:  Medications  morphine 4 MG/ML injection 4 mg (4 mg Intravenous Given 01/14/20 0111)  ondansetron (ZOFRAN) injection 4 mg (4 mg Intravenous Given 01/14/20 0109)  HYDROmorphone (DILAUDID) injection 1 mg (1 mg Intravenous Given 01/14/20 0240)  sodium chloride 0.9 % bolus 1,000 mL (0 mLs Intravenous Stopped 01/14/20 0526)  gadobutrol (GADAVIST)  1 MMOL/ML injection 7 mL (7 mLs Intravenous Contrast Given 01/14/20 0523)     ED Discharge Orders         Ordered    oxyCODONE-acetaminophen (PERCOCET) 5-325 MG tablet  Every 4 hours PRN     Discontinue  Reprint     01/14/20 4193          *Please note:  [name] was evaluated in Emergency Department on 01/14/2020 for the symptoms described in the history of present illness. He was evaluated in the context of the global COVID-19 pandemic, which necessitated consideration that the patient might be at risk for infection with the SARS-CoV-2 virus that causes COVID-19. Institutional protocols and algorithms that pertain to the evaluation of patients at risk for COVID-19 are in a state of rapid change based on information released by regulatory bodies including the CDC and federal and state organizations. These policies and algorithms were followed during the patient's care in the ED.  Some ED evaluations and interventions may be delayed as a result of limited staffing during the pandemic.*  Note:  This document was prepared using Dragon voice recognition software and may include unintentional dictation errors.   Gregor Hams, MD 01/14/20 [id number]

## 2020-01-14 NOTE — Patient Instructions (Signed)
Please take Metamucil daily.  We will refer you to the Hematologist.   Psyllium granules or powder for solution What is this medicine? PSYLLIUM (SIL i yum) is a bulk-forming fiber laxative. This medicine is used to treat constipation. Increasing fiber in the diet may also help lower cholesterol and promote heart health for some people. This medicine may be used for other purposes; ask your health care provider or pharmacist if you have questions. COMMON BRAND NAME(S): Fiber Therapy, GenFiber, Geri-Mucil, Hydrocil, Konsyl, Metamucil, Metamucil MultiHealth, Mucilin, Natural Fiber Therapy, Reguloid What should I tell my health care provider before I take this medicine? They need to know if you have any of these conditions:  blockage in your bowel  difficulty swallowing  inflammatory bowel disease  phenylketonuria  stomach or intestine problems  sudden change in bowel habits lasting more than 2 weeks  an unusual or allergic reaction to psyllium, other medicines, dyes, or preservatives  pregnant or trying or get pregnant  breast-feeding How should I use this medicine? Mix this medicine into a full glass (240 mL) of water or other cool drink. Take this medicine by mouth. Follow the directions on the package labeling, or take as directed by your health care professional. Take your medicine at regular intervals. Do not take your medicine more often than directed. Talk to your pediatrician regarding the use of this medicine in children. While this drug may be prescribed for children as young as 79 years old for selected conditions, precautions do apply. Overdosage: If you think you have taken too much of this medicine contact a poison control center or emergency room at once. NOTE: This medicine is only for you. Do not share this medicine with others. What if I miss a dose? If you miss a dose, take it as soon as you can. If it is almost time for your next dose, take only that dose. Do not  take double or extra doses. What may interact with this medicine? Interactions are not expected. Take this product at least 2 hours before or after other medicines. This list may not describe all possible interactions. Give your health care provider a list of all the medicines, herbs, non-prescription drugs, or dietary supplements you use. Also tell them if you smoke, drink alcohol, or use illegal drugs. Some items may interact with your medicine. What should I watch for while using this medicine? Check with your doctor or health care professional if your symptoms do not start to get better or if they get worse. Stop using this medicine and contact your doctor or health care professional if you have rectal bleeding or if you have to treat your constipation for more than 1 week. These could be signs of a more serious condition. Drink several glasses of water a day while you are taking this medicine. This will help to relieve constipation and prevent dehydration. What side effects may I notice from receiving this medicine? Side effects that you should report to your doctor or health care professional as soon as possible:  allergic reactions like skin rash, itching or hives, swelling of the face, lips, or tongue  breathing problems  chest pain  nausea, vomiting  rectal bleeding  trouble swallowing Side effects that usually do not require medical attention (report to your doctor or health care professional if they continue or are bothersome):  bloating  gas  stomach cramps This list may not describe all possible side effects. Call your doctor for medical advice about side effects. You  may report side effects to FDA at 1-800-FDA-1088. Where should I keep my medicine? Keep out of the reach of children. Store at room temperature between 15 and 30 degrees C (59 and 86 degrees F). Protect from moisture. Throw away any unused medicine after the expiration date. NOTE: This sheet is a summary.  It may not cover all possible information. If you have questions about this medicine, talk to your doctor, pharmacist, or health care provider.  2020 Elsevier/Gold Standard (2017-12-11 15:41:08)

## 2020-01-15 NOTE — Progress Notes (Signed)
Charles Beasley [address]  [place]  [place], [place] [address]  Main: [phone]  Fax: [phone]   Gastroenterology Consultation  Referring Provider:     [facility] Primary Care Physician:  [facility] Reason for Consultation: Abdominal pain        HPI:    Chief Complaint  Patient presents with  . New Patient (Initial Visit)  . RLQ abdominal pain    Patient stated that he had been having abdominal pain on/off for the past 2 years with no resolution.    Charles Beasley is a 44 y.o. y/o male referred for consultation & management  by Dr. Shari Prows, [facility].  Patient reports diffuse abdominal pain for years, unrelated to meals.  Has had several ER visits with unrevealing etiology.  Has had urology work-up recently as well.  Please see notes in chart.  No prior EGD or colonoscopy.  Also reports constipation with alternating loose bowel movements on some days.  No blood in stool.  Past Medical History:  Diagnosis Date  . Asthma   . Bladder cancer ([facility])   . Colitis   . Diabetes mellitus without complication ([facility])   . Diverticulosis   . History of kidney stones   . IBS (irritable bowel syndrome)     No past surgical history on file.  Prior to Admission medications   Medication Sig Start Date End Date Taking? Authorizing Provider  ACCU-CHEK GUIDE test strip 2 (two) times daily. for testing as directed 11/05/19  Yes [provider]  albuterol (PROVENTIL HFA;VENTOLIN HFA) 108 (90 BASE) MCG/ACT inhaler Inhale 2 puffs into the lungs every 6 (six) hours as needed. Reported on 11/08/2015   Yes [provider]  busPIRone (BUSPAR) 7.5 MG tablet Take 7.5 mg by mouth 2 (two) times daily. 08/29/19  Yes [provider]  citalopram (CELEXA) 20 MG tablet Take 20 mg by mouth daily. 11/05/19  Yes [provider]  glipiZIDE (GLUCOTROL XL) 5 MG 24 hr tablet Take 5 mg by mouth daily. 09/02/19  Yes [provider]  metFORMIN (GLUCOPHAGE) 1000 MG tablet Take 1,000 mg by mouth 2 (two) times daily. 08/19/19  Yes [provider]  omeprazole (PRILOSEC) 20 MG capsule Take 20 mg by mouth 2 (two) times daily. 08/25/19  Yes [provider]  oxyCODONE-acetaminophen (PERCOCET) 5-325 MG tablet Take 1 tablet by mouth every 4 (four) hours as needed. 01/14/20 01/13/21 Yes Gregor Hams, MD  pantoprazole (PROTONIX) 40 MG tablet Take 40 mg by mouth daily. 11/24/19  Yes [provider]  simvastatin (ZOCOR) 20 MG tablet Take 20 mg by mouth at bedtime. 08/16/19  Yes [provider]    Family History  Problem Relation Age of Onset  . Breast cancer Mother   . Hypertension Father   . Prostate cancer Neg Hx   . Bladder Cancer Neg Hx   . Kidney cancer Neg Hx      Social History   Tobacco Use  . Smoking status: Former Smoker    Types: Cigarettes  . Smokeless tobacco: Never Used  Vaping Use  . Vaping Use: Every day  Substance Use Topics  . Alcohol use: No    Alcohol/week: 0.0 standard drinks  . Drug use: No    Allergies as of 01/14/2020 - Review Complete 01/14/2020  Allergen Reaction Noted  . Montelukast Photosensitivity 11/08/2015  . Sertraline Other (See Comments) 11/08/2015    Review of Systems:  All systems reviewed and negative except where noted in HPI.   Physical Exam:  BP 107/69   Pulse 94   Temp 98.3 F (36.8 C) (Oral)   Ht 5\' 9"  (1.753 m)   Wt 158 lb 6.4 oz (71.8 kg)   BMI 23.39 kg/m  No LMP for male patient. Psych:  Alert and cooperative. Normal mood and affect. General:   Alert,  Well-developed, well-nourished, pleasant and cooperative in NAD Head:  Normocephalic and atraumatic. Eyes:  Sclera clear, no icterus.   Conjunctiva pink. Ears:  Normal auditory acuity. Nose:  No deformity, discharge, or lesions. Mouth:  No deformity or lesions,oropharynx pink & moist. Neck:  Supple; no masses or thyromegaly. Abdomen:  Normal bowel sounds.  No bruits.   Soft, non-tender and non-distended without masses, hepatosplenomegaly or hernias noted.  No guarding or rebound tenderness.    Msk:  Symmetrical without gross deformities. Good, equal movement & strength bilaterally. Pulses:  Normal pulses noted. Extremities:  No clubbing or edema.  No cyanosis. Neurologic:  Alert and oriented x3;  grossly normal neurologically. Skin:  Intact without significant lesions or rashes. No jaundice. Lymph Nodes:  No significant cervical adenopathy. Psych:  Alert and cooperative. Normal mood and affect.   Labs: CBC    Component Value Date/Time   WBC 16.5 (H) 01/13/2020 1838   RBC 4.48 01/13/2020 1838   HGB 13.9 01/13/2020 1838   HCT 41.1 01/13/2020 1838   PLT 410 (H) 01/13/2020 1838   MCV 91.7 01/13/2020 1838   MCH 31.0 01/13/2020 1838   MCHC 33.8 01/13/2020 1838   RDW 13.0 01/13/2020 1838   LYMPHSABS 3.5 11/20/2019 0513   MONOABS 0.7 11/20/2019 0513   EOSABS 0.7 (H) 11/20/2019 0513   BASOSABS 0.1 11/20/2019 0513   CMP     Component Value Date/Time   NA 138 01/13/2020 1838   K 4.1 01/13/2020 1838   CL 105 01/13/2020 1838   CO2 24 01/13/2020 1838   GLUCOSE 254 (H) 01/13/2020 1838   BUN 17 01/13/2020 1838   CREATININE 0.92 01/13/2020 1838   CALCIUM 9.0 01/13/2020 1838   PROT 8.0 11/18/2019 1624   ALBUMIN 4.6 11/18/2019 1624   AST 20 11/18/2019 1624   ALT 20 11/18/2019 1624   ALKPHOS 66 11/18/2019 1624   BILITOT 0.6 11/18/2019 1624   GFRNONAA >60 01/13/2020 1838   GFRAA >60 01/13/2020 1838    Imaging Studies: MR PELVIS W WO CONTRAST  Result Date: 01/14/2020 CLINICAL DATA:  RIGHT lower quadrant abdominal pain, continued abdominal pain EXAM: MRI ABDOMEN AND PELVIS WITHOUT AND WITH CONTRAST TECHNIQUE: Multiplanar multisequence MR imaging of the abdomen and pelvis was performed both before and after the administration of intravenous contrast. CONTRAST:  62mL GADAVIST GADOBUTROL 1 MMOL/ML IV SOLN COMPARISON:  CT evaluation from 01/12/2020 and  11/18/2019 FINDINGS: COMBINED FINDINGS FOR BOTH MR ABDOMEN AND PELVIS Lower chest: Incidental imaging of the lung bases without consolidation or signs of pleural effusion. No pericardial fluid. Hepatobiliary: Cyst in the RIGHT hepatic lobe. Mild hepatic steatosis. No pericholecystic stranding. No biliary ductal dilation. Pancreas:  Normal appearance of the pancreas. Spleen:  Spleen normal size without focal lesion. Adrenals/Urinary Tract:  Adrenal glands are normal. Smooth renal contours with symmetric enhancement. Small calculus seen on the previous exam is not seen on today's MRI. This is present in the LEFT kidney on the previous CT. Stomach/Bowel: Gastrointestinal tract is unremarkable to the extent evaluated on MRI, not protocol for bowel evaluation. No perienteric or pericolonic stranding. No  sign of bowel obstruction. Normal appendix. Vascular/Lymphatic: Vascular structures in the abdomen are patent. No retroperitoneal adenopathy. No pelvic adenopathy. Reproductive: Prostate with heterogeneous signal on T2, nonspecific. Correlate with any clinical evidence of prostatitis. Other:  No ascites. Musculoskeletal: No suspicious bone lesions identified. IMPRESSION: 1. No evidence of bowel obstruction or other acute findings within the abdomen or pelvis. The appendix is normal. 2. No biliary duct dilation or pericholecystic stranding. 3. Mild hepatic steatosis. 4. Nonspecific mild heterogeneity of the prostate not well evaluated, correlate with any clinical signs of prostatitis. 5. Study not adequate for renal or ureteral calculus evaluation though there is no hydronephrosis or perinephric stranding. Electronically Signed   By: Zetta Bills M.D.   On: 01/14/2020 08:25   MR Abdomen W or Wo Contrast  Result Date: 01/14/2020 CLINICAL DATA:  RIGHT lower quadrant abdominal pain, continued abdominal pain EXAM: MRI ABDOMEN AND PELVIS WITHOUT AND WITH CONTRAST TECHNIQUE: Multiplanar multisequence MR imaging of the  abdomen and pelvis was performed both before and after the administration of intravenous contrast. CONTRAST:  61mL GADAVIST GADOBUTROL 1 MMOL/ML IV SOLN COMPARISON:  CT evaluation from 01/12/2020 and 11/18/2019 FINDINGS: COMBINED FINDINGS FOR BOTH MR ABDOMEN AND PELVIS Lower chest: Incidental imaging of the lung bases without consolidation or signs of pleural effusion. No pericardial fluid. Hepatobiliary: Cyst in the RIGHT hepatic lobe. Mild hepatic steatosis. No pericholecystic stranding. No biliary ductal dilation. Pancreas:  Normal appearance of the pancreas. Spleen:  Spleen normal size without focal lesion. Adrenals/Urinary Tract:  Adrenal glands are normal. Smooth renal contours with symmetric enhancement. Small calculus seen on the previous exam is not seen on today's MRI. This is present in the LEFT kidney on the previous CT. Stomach/Bowel: Gastrointestinal tract is unremarkable to the extent evaluated on MRI, not protocol for bowel evaluation. No perienteric or pericolonic stranding. No sign of bowel obstruction. Normal appendix. Vascular/Lymphatic: Vascular structures in the abdomen are patent. No retroperitoneal adenopathy. No pelvic adenopathy. Reproductive: Prostate with heterogeneous signal on T2, nonspecific. Correlate with any clinical evidence of prostatitis. Other:  No ascites. Musculoskeletal: No suspicious bone lesions identified. IMPRESSION: 1. No evidence of bowel obstruction or other acute findings within the abdomen or pelvis. The appendix is normal. 2. No biliary duct dilation or pericholecystic stranding. 3. Mild hepatic steatosis. 4. Nonspecific mild heterogeneity of the prostate not well evaluated, correlate with any clinical signs of prostatitis. 5. Study not adequate for renal or ureteral calculus evaluation though there is no hydronephrosis or perinephric stranding. Electronically Signed   By: Zetta Bills M.D.   On: 01/14/2020 08:25   CT Abdomen Pelvis W Contrast  Result Date:  01/12/2020 CLINICAL DATA:  Right lower quadrant/right flank pain. EXAM: CT ABDOMEN AND PELVIS WITH CONTRAST TECHNIQUE: Multidetector CT imaging of the abdomen and pelvis was performed using the standard protocol following bolus administration of intravenous contrast. CONTRAST:  121mL OMNIPAQUE IOHEXOL 300 MG/ML  SOLN COMPARISON:  Multiple prior CTs, most recently noncontrast CT 11/18/2019. Contrast-enhanced CT 10/12/2019 FINDINGS: Lower chest: The lung bases are clear. Hepatobiliary: Unchanged subcentimeter hypodensity in the right lobe of the liver. Background hepatic steatosis. Gallbladder physiologically distended, no calcified stone. No biliary dilatation. Pancreas: No ductal dilatation or inflammation. Spleen: Normal in size without focal abnormality. Adrenals/Urinary Tract: Normal adrenal glands. Previous right proximal ureteral calculus has passed. No hydronephrosis or perinephric edema. Homogeneous renal enhancement with symmetric excretion on delayed phase imaging. Again seen nonobstructing stone in the left kidney. Urinary bladder is minimally distended, unremarkable for degree of distension.  Stomach/Bowel: Nondistended stomach. There is no small bowel obstruction or inflammatory change. Normal appendix, for example series 5, image 39. Distal colonic diverticulosis without diverticulitis. Suggestion of mild mural hypertrophy in the sigmoid. Moderate stool burden in the proximal colon. Vascular/Lymphatic: Aorto bi-iliac atherosclerosis without aneurysm. No enlarged lymph nodes in the abdomen or pelvis. Reproductive: Prostate is unremarkable. Other: No free air, free fluid, or intra-abdominal fluid collection. Musculoskeletal: There are no acute or suspicious osseous abnormalities. IMPRESSION: 1. No acute abnormality in the abdomen/pelvis. Normal appendix. 2. Previous right proximal ureteral calculus has passed. No hydronephrosis or obstructive uropathy. 3. Nonobstructing left renal stone. 4. Colonic  diverticulosis without diverticulitis. 5. Hepatic steatosis. Aortic Atherosclerosis (ICD10-I70.0). Electronically Signed   By: Keith Rake M.D.   On: 01/12/2020 20:21    Assessment and Plan:   GORO WENRICK is a 44 y.o. y/o male has been referred for abdominal pain  Imaging reassuring Patient is due for screening colonoscopy which would also allow [place] to rule out microscopic colitis with biopsies given that he has alternating stools  Obtain biopsies for H. pylori and abdominal pain  Hepatic steatosis noted on imaging, obtain liver work-up  Finding of fatty liver on imaging discussed with patient Diet, weight loss, and exercise encouraged along with avoiding hepatotoxic drugs including alcohol Risk of progression to cirrhosis if above measures are not instituted were discussed as well, and patient verbalized understanding  Referred to hematology for thrombocytosis and leukocytosis   Dr Charles Beasley  Speech recognition software was used to dictate the above note.

## 2020-01-16 LAB — HEPATITIS B CORE ANTIBODY, TOTAL: Hep B Core Total Ab: NEGATIVE

## 2020-01-16 LAB — HEPATITIS C ANTIBODY (REFLEX): HCV Ab: <0.1 s/co ratio (ref 0.0–0.9)

## 2020-01-16 LAB — HEPATITIS B SURFACE ANTIGEN: Hepatitis B Surface Ag: NEGATIVE

## 2020-01-16 LAB — CERULOPLASMIN: Ceruloplasmin: 19.5 mg/dL (ref 16.0–31.0)

## 2020-01-16 LAB — ANA: ANA Titer 1: NEGATIVE

## 2020-01-16 LAB — HCV COMMENT:

## 2020-01-16 LAB — FERRITIN: Ferritin: 161 ng/mL (ref 30–400)

## 2020-01-16 LAB — HEPATITIS B SURFACE ANTIBODY,QUALITATIVE: Hep B Surface Ab, Qual: NONREACTIVE

## 2020-01-16 LAB — MITOCHONDRIAL/SMOOTH MUSCLE AB PNL
Mitochondrial Ab: <20 Units (ref 0.0–20.0)
Smooth Muscle Ab: 7 Units (ref 0–19)

## 2020-01-16 LAB — HEPATITIS A ANTIBODY, TOTAL: hep A Total Ab: NEGATIVE

## 2020-01-16 LAB — ANTI-MICROSOMAL ANTIBODY LIVER / KIDNEY: LKM1 Ab: 0.8 Units (ref 0.0–20.0)

## 2020-01-20 ENCOUNTER — Other Ambulatory Visit
Admission: RE | Admit: 2020-01-20 | Discharge: 2020-01-20 | Disposition: A | Discharge status: Home / Self Care | Payer: PRIVATE HEALTH INSURANCE | Source: Ambulatory Visit | Attending: Gastroenterology | Admitting: Gastroenterology

## 2020-01-20 ENCOUNTER — Other Ambulatory Visit: Payer: Self-pay

## 2020-01-20 DIAGNOSIS — Z20822 Contact with and (suspected) exposure to covid-19: Secondary | ICD-10-CM | POA: Insufficient documentation

## 2020-01-20 DIAGNOSIS — Z01812 Encounter for preprocedural laboratory examination: Secondary | ICD-10-CM | POA: Diagnosis not present

## 2020-01-20 LAB — SARS CORONAVIRUS 2 (TAT 6-24 HRS): SARS Coronavirus 2: NEGATIVE

## 2020-01-22 ENCOUNTER — Encounter: Payer: Self-pay | Admitting: Gastroenterology

## 2020-01-22 ENCOUNTER — Ambulatory Visit: Payer: PRIVATE HEALTH INSURANCE | Admitting: Registered Nurse

## 2020-01-22 ENCOUNTER — Ambulatory Visit
Admission: RE | Admit: 2020-01-22 | Discharge: 2020-01-22 | Disposition: A | Discharge status: Home / Self Care | Payer: PRIVATE HEALTH INSURANCE | Attending: Gastroenterology | Admitting: Gastroenterology

## 2020-01-22 ENCOUNTER — Encounter
Admission: RE | Disposition: A | Discharge status: Home / Self Care | Payer: Self-pay | Source: Home / Self Care | Attending: Gastroenterology

## 2020-01-22 DIAGNOSIS — K2970 Gastritis, unspecified, without bleeding: Secondary | ICD-10-CM | POA: Diagnosis not present

## 2020-01-22 DIAGNOSIS — R1084 Generalized abdominal pain: Secondary | ICD-10-CM | POA: Diagnosis not present

## 2020-01-22 DIAGNOSIS — K635 Polyp of colon: Secondary | ICD-10-CM | POA: Diagnosis not present

## 2020-01-22 DIAGNOSIS — R109 Unspecified abdominal pain: Secondary | ICD-10-CM | POA: Insufficient documentation

## 2020-01-22 DIAGNOSIS — G4730 Sleep apnea, unspecified: Secondary | ICD-10-CM | POA: Diagnosis not present

## 2020-01-22 DIAGNOSIS — D122 Benign neoplasm of ascending colon: Secondary | ICD-10-CM | POA: Diagnosis not present

## 2020-01-22 DIAGNOSIS — Z87891 Personal history of nicotine dependence: Secondary | ICD-10-CM | POA: Insufficient documentation

## 2020-01-22 DIAGNOSIS — K219 Gastro-esophageal reflux disease without esophagitis: Secondary | ICD-10-CM | POA: Diagnosis not present

## 2020-01-22 DIAGNOSIS — Z1211 Encounter for screening for malignant neoplasm of colon: Secondary | ICD-10-CM | POA: Diagnosis not present

## 2020-01-22 DIAGNOSIS — Z7984 Long term (current) use of oral hypoglycemic drugs: Secondary | ICD-10-CM | POA: Insufficient documentation

## 2020-01-22 DIAGNOSIS — E119 Type 2 diabetes mellitus without complications: Secondary | ICD-10-CM | POA: Diagnosis not present

## 2020-01-22 DIAGNOSIS — K5730 Diverticulosis of large intestine without perforation or abscess without bleeding: Secondary | ICD-10-CM | POA: Diagnosis not present

## 2020-01-22 DIAGNOSIS — Z8551 Personal history of malignant neoplasm of bladder: Secondary | ICD-10-CM | POA: Insufficient documentation

## 2020-01-22 DIAGNOSIS — J45909 Unspecified asthma, uncomplicated: Secondary | ICD-10-CM | POA: Diagnosis not present

## 2020-01-22 DIAGNOSIS — Z79899 Other long term (current) drug therapy: Secondary | ICD-10-CM | POA: Insufficient documentation

## 2020-01-22 HISTORY — PX: ESOPHAGOGASTRODUODENOSCOPY (EGD) WITH PROPOFOL: SHX5813

## 2020-01-22 HISTORY — PX: COLONOSCOPY WITH PROPOFOL: SHX5780

## 2020-01-22 LAB — GLUCOSE, CAPILLARY: Glucose-Capillary: 175 mg/dL — ABNORMAL HIGH (ref 70–99)

## 2020-01-22 SURGERY — COLONOSCOPY WITH PROPOFOL
Anesthesia: General

## 2020-01-22 MED ORDER — PROPOFOL 10 MG/ML IV BOLUS
INTRAVENOUS | Status: DC | PRN
  Administered 2020-01-22: 30 mg via INTRAVENOUS
  Administered 2020-01-22: 70 mg via INTRAVENOUS

## 2020-01-22 MED ORDER — DEXMEDETOMIDINE HCL 200 MCG/2ML IV SOLN
INTRAVENOUS | Status: DC | PRN
  Administered 2020-01-22: 20 ug via INTRAVENOUS

## 2020-01-22 MED ORDER — LIDOCAINE HCL (CARDIAC) PF 100 MG/5ML IV SOSY
PREFILLED_SYRINGE | INTRAVENOUS | Status: DC | PRN
  Administered 2020-01-22: 60 mg via INTRAVENOUS
  Administered 2020-01-22: 40 mg via INTRAVENOUS

## 2020-01-22 MED ORDER — PHENYLEPHRINE HCL (PRESSORS) 10 MG/ML IV SOLN
INTRAVENOUS | Status: DC | PRN
  Administered 2020-01-22 (×3): 100 ug via INTRAVENOUS
  Administered 2020-01-22: 150 ug via INTRAVENOUS

## 2020-01-22 MED ORDER — PROPOFOL 500 MG/50ML IV EMUL
INTRAVENOUS | Status: AC
  Filled 2020-01-22: qty 50

## 2020-01-22 MED ORDER — PROPOFOL 500 MG/50ML IV EMUL
INTRAVENOUS | Status: DC | PRN
  Administered 2020-01-22: 150 ug/kg/min via INTRAVENOUS

## 2020-01-22 MED ORDER — SODIUM CHLORIDE 0.9 % IV SOLN: INTRAVENOUS | Status: DC

## 2020-01-22 NOTE — H&P (Signed)
Vonda Antigua, MD [address] [place], [place], [place], [address] [address], [place], [place], [place], [address] Phone: [phone]  Fax: [phone]  Primary Care Physician:  [facility]   Pre-Procedure History & Physical: HPI:  Charles Beasley is a 44 y.o. male is here for a colonoscopy and EGD.   Past Medical History:  Diagnosis Date  . Asthma   . Bladder cancer ([facility])   . Colitis   . Diabetes mellitus without complication ([facility])   . Diverticulosis   . History of kidney stones   . IBS (irritable bowel syndrome)     History reviewed. No pertinent surgical history.  Prior to Admission medications   Medication Sig Start Date End Date Taking? Authorizing Provider  ACCU-CHEK GUIDE test strip 2 (two) times daily. for testing as directed 11/05/19  Yes [provider]  busPIRone (BUSPAR) 7.5 MG tablet Take 7.5 mg by mouth 2 (two) times daily. 08/29/19  Yes [provider]  citalopram (CELEXA) 20 MG tablet Take 20 mg by mouth daily. 11/05/19  Yes [provider]  glipiZIDE (GLUCOTROL XL) 5 MG 24 hr tablet Take 5 mg by mouth daily. 09/02/19  Yes [provider]  metFORMIN (GLUCOPHAGE) 1000 MG tablet Take 1,000 mg by mouth 2 (two) times daily. 08/19/19  Yes [provider]  omeprazole (PRILOSEC) 20 MG capsule Take 20 mg by mouth 2 (two) times daily. 08/25/19  Yes [provider]  pantoprazole (PROTONIX) 40 MG tablet Take 40 mg by mouth daily. 11/24/19  Yes [provider]  simvastatin (ZOCOR) 20 MG tablet Take 20 mg by mouth at bedtime. 08/16/19  Yes [provider]  albuterol (PROVENTIL HFA;VENTOLIN HFA) 108 (90 BASE) MCG/ACT inhaler Inhale 2 puffs into the lungs every 6 (six) hours as needed. Reported on 11/08/2015    [provider]  oxyCODONE-acetaminophen (PERCOCET) 5-325 MG tablet Take 1 tablet by mouth every 4 (four) hours as needed. Patient not taking: Reported on 01/22/2020 01/14/20  01/13/21  Gregor Hams, MD    Allergies as of 01/14/2020 - Review Complete 01/14/2020  Allergen Reaction Noted  . Montelukast Photosensitivity 11/08/2015  . Sertraline Other (See Comments) 11/08/2015    Family History  Problem Relation Age of Onset  . Breast cancer Mother   . Hypertension Father   . Prostate cancer Neg Hx   . Bladder Cancer Neg Hx   . Kidney cancer Neg Hx     Social History   Socioeconomic History  . Marital status: Married    Spouse name: Not on file  . Number of children: Not on file  . Years of education: Not on file  . Highest education level: Not on file  Occupational History  . Not on file  Tobacco Use  . Smoking status: Former Smoker    Types: Cigarettes  . Smokeless tobacco: Never Used  Vaping Use  . Vaping Use: Every day  Substance and Sexual Activity  . Alcohol use: No    Alcohol/week: 0.0 standard drinks  . Drug use: No  . Sexual activity: Not on file  Other Topics Concern  . Not on file  Social History Narrative  . Not on file   Social Determinants of Health   Financial Resource Strain:   . Difficulty of Paying Living Expenses:   Food Insecurity:   . Worried About Charity fundraiser in the Last Year:   . Arboriculturist in the Last Year:   [employer]  Needs:   . Lack of Transportation (Medical):   [place]land [place] Lack of Transportation (Non-Medical):   Physical Activity:   . Days of Exercise per Week:   . Minutes of Exercise per Session:   Stress:   . Feeling of Stress :   Social Connections:   . Frequency of Communication with Friends and Family:   . Frequency of Social Gatherings with Friends and Family:   . Attends Religious Services:   . Active Member of Clubs or Organizations:   . Attends Archivist Meetings:   Marland Kitchen Marital Status:   Intimate Partner Violence:   . Fear of Current or Ex-Partner:   . Emotionally Abused:   Marland Kitchen Physically Abused:   . Sexually Abused:     Review of Systems: See HPI, otherwise  negative ROS  Physical Exam: BP (!) 131/98   Pulse 90   Temp (!) 96.9 F (36.1 C) (Temporal)   Resp 16   Ht 5\' 9"  (1.753 m)   Wt 70.9 kg   SpO2 100%   BMI 23.08 kg/m  General:   Alert,  pleasant and cooperative in NAD Head:  Normocephalic and atraumatic. Neck:  Supple; no masses or thyromegaly. Lungs:  Clear throughout to auscultation, normal respiratory effort.    Heart:  +S1, +S2, Regular rate and rhythm, No edema. Abdomen:  Soft, nontender and nondistended. Normal bowel sounds, without guarding, and without rebound.   Neurologic:  Alert and  oriented x4;  grossly normal neurologically.  Impression/Plan: Charles Beasley is here for a colonoscopy to be performed for average risk screening and EGD for abdominal pain  Risks, benefits, limitations, and alternatives regarding the procedures have been reviewed with the patient.  Questions have been answered.  All parties agreeable.   Virgel Manifold, MD  01/22/2020, 9:10 AM

## 2020-01-22 NOTE — Anesthesia Preprocedure Evaluation (Signed)
Anesthesia Evaluation  Patient identified by MRN, date of birth, ID band Patient awake    Reviewed: Allergy & Precautions, H&P , NPO status , Patient's Chart, lab work & pertinent test results  History of Anesthesia Complications Negative for: history of anesthetic complications  Airway Mallampati: III  TM Distance: >3 FB Neck ROM: full    Dental  (+) Chipped, Poor Dentition   Pulmonary asthma , sleep apnea , Patient abstained from smoking., former smoker,    Pulmonary exam normal        Cardiovascular Exercise Tolerance: Good (-) angina(-) Past MI and (-) DOE Normal cardiovascular exam     Neuro/Psych PSYCHIATRIC DISORDERS  Neuromuscular disease    GI/Hepatic Neg liver ROS, GERD  Medicated and Controlled,  Endo/Other  diabetes, Type 2  Renal/GU Renal disease  negative genitourinary   Musculoskeletal   Abdominal   Peds  Hematology negative hematology ROS (+)   Anesthesia Other Findings Past Medical History: No date: Asthma No date: Bladder cancer ([facility]) No date: Colitis No date: Diabetes mellitus without complication ([facility]) No date: Diverticulosis No date: History of kidney stones No date: IBS (irritable bowel syndrome)  History reviewed. No pertinent surgical history.  BMI    Body Mass Index: 23.08 kg/m      Reproductive/Obstetrics negative OB ROS                             Anesthesia Physical Anesthesia Plan  ASA: III  Anesthesia Plan: General   Post-op Pain Management:    Induction: Intravenous  PONV Risk Score and Plan: Propofol infusion and TIVA  Airway Management Planned: Natural Airway and Nasal Cannula  Additional Equipment:   Intra-op Plan:   Post-operative Plan:   Informed Consent: I have reviewed the patients History and Physical, chart, labs and discussed the procedure including the risks, benefits and alternatives for the proposed anesthesia with the  patient or authorized representative who has indicated his/her understanding and acceptance.     Dental Advisory Given  Plan Discussed with: Anesthesiologist, CRNA and Surgeon  Anesthesia Plan Comments: (Patient consented for risks of anesthesia including but not limited to:  - adverse reactions to medications - risk of intubation if required - damage to eyes, teeth, lips or other oral mucosa - nerve damage due to positioning  - sore throat or hoarseness - Damage to heart, brain, nerves, lungs, other parts of body or loss of life  Patient voiced understanding.)        Anesthesia Quick Evaluation

## 2020-01-22 NOTE — Op Note (Signed)
[facility] Gastroenterology Patient Name: Charles Beasley Procedure Date: 01/22/2020 9:17 AM MRN: [id number] Account #: [id number] Date of Birth: 10-11-1975 Admit Type: Outpatient Age: 44 Room: [facility] ENDO ROOM 2 Gender: Male Note Status: Finalized Procedure:             Upper GI endoscopy Indications:           Abdominal pain Providers:             Mattea Seger B. Bonna Gains MD, MD Referring MD:          Rubbie Battiest Iona Beard MD, MD (Referring MD) Medicines:             Monitored Anesthesia Care Complications:         No immediate complications. Procedure:             Pre-Anesthesia Assessment:                        - Prior to the procedure, a History and Physical was                         performed, and patient medications, allergies and                         sensitivities were reviewed. The patient's tolerance                         of previous anesthesia was reviewed.                        - The risks and benefits of the procedure and the                         sedation options and risks were discussed with the                         patient. All questions were answered and informed                         consent was obtained.                        - Patient identification and proposed procedure were                         verified prior to the procedure by the physician, the                         nurse, the anesthesiologist, the anesthetist and the                         technician. The procedure was verified in the                         procedure room.                        - ASA Grade Assessment: II - A patient with mild                         systemic  disease.                        After obtaining informed consent, the endoscope was                         passed under direct vision. Throughout the procedure,                         the patient's blood pressure, pulse, and oxygen                         saturations were monitored continuously. The  Endoscope                         was introduced through the mouth, and advanced to the                         second part of duodenum. The upper GI endoscopy was                         accomplished with ease. The patient tolerated the                         procedure well. Findings:      The examined esophagus was normal.      The entire examined stomach was normal. Biopsies were obtained in the       gastric body, at the incisura and in the gastric antrum with cold       forceps for histology. Biopsies were taken with a cold forceps for       Helicobacter pylori testing.      The duodenal bulb, second portion of the duodenum and examined duodenum       were normal. Biopsies for histology were taken with a cold forceps for       evaluation of celiac disease. Impression:            - Normal esophagus.                        - Normal stomach. Biopsied.                        - Normal duodenal bulb, second portion of the duodenum                         and examined duodenum. Biopsied.                        - Biopsies were obtained in the gastric body, at the                         incisura and in the gastric antrum. Recommendation:        - Await pathology results.                        - Discharge patient to home (with escort).                        - Advance diet as tolerated.                        -  Continue present medications.                        - Patient has a contact number available for                         emergencies. The signs and symptoms of potential                         delayed complications were discussed with the patient.                         Return to normal activities tomorrow. Written                         discharge instructions were provided to the patient.                        - Discharge patient to home (with escort).                        - The findings and recommendations were discussed with                         the patient.                         - The findings and recommendations were discussed with                         the patient's family. Procedure Code(s):     --- Professional ---                        [phone], Esophagogastroduodenoscopy, flexible,                         transoral; with biopsy, single or multiple Diagnosis Code(s):     --- Professional ---                        R10.9, Unspecified abdominal pain CPT copyright 2019 American [employer]. All rights reserved. The codes documented in this report are preliminary and upon coder review may  be revised to meet current compliance requirements.  Vonda Antigua, MD Margretta Sidle B. Bonna Gains MD, MD 01/22/2020 9:38:55 AM This report has been signed electronically. Number of Addenda: 0 Note Initiated On: 01/22/2020 9:17 AM Estimated Blood Loss:  Estimated blood loss: none.      [facility]

## 2020-01-22 NOTE — Op Note (Signed)
[facility] Gastroenterology Patient Name: Charles Beasley Procedure Date: 01/22/2020 9:16 AM MRN: [id number] Account #: [id number] Date of Birth: 07/11/1976 Admit Type: Outpatient Age: 44 Room: [facility] ENDO ROOM 2 Gender: Male Note Status: Finalized Procedure:             Colonoscopy Indications:           Screening for colorectal malignant neoplasm Providers:             Brion Hedges B. Bonna Gains MD, MD Medicines:             Monitored Anesthesia Care Complications:         No immediate complications. Procedure:             Pre-Anesthesia Assessment:                        - ASA Grade Assessment: II - A patient with mild                         systemic disease.                        - Prior to the procedure, a History and Physical was                         performed, and patient medications, allergies and                         sensitivities were reviewed. The patient's tolerance                         of previous anesthesia was reviewed.                        - The risks and benefits of the procedure and the                         sedation options and risks were discussed with the                         patient. All questions were answered and informed                         consent was obtained.                        - Patient identification and proposed procedure were                         verified prior to the procedure by the physician, the                         nurse, the anesthesiologist, the anesthetist and the                         technician. The procedure was verified in the                         procedure room.  After obtaining informed consent, the colonoscope was                         passed under direct vision. Throughout the procedure,                         the patient's blood pressure, pulse, and oxygen                         saturations were monitored continuously. The                         Colonoscope was  introduced through the anus and                         advanced to the the cecum, identified by appendiceal                         orifice and ileocecal valve. The colonoscopy was                         performed with ease. The patient tolerated the                         procedure well. The quality of the bowel preparation                         was fair. Findings:      The perianal and digital rectal examinations were normal.      A 4 mm polyp was found in the ascending colon. The polyp was sessile.       The polyp was removed with a cold biopsy forceps. Resection and       retrieval were complete.      Multiple diverticula were found in the sigmoid colon.      The exam was otherwise without abnormality.      The rectum, sigmoid colon, descending colon, transverse colon, ascending       colon and cecum appeared normal. Biopsies for histology were taken with       a cold forceps for evaluation of microscopic colitis.      The retroflexed view of the distal rectum and anal verge was normal and       showed no anal or rectal abnormalities. Impression:            - Preparation of the colon was fair.                        - One 4 mm polyp in the ascending colon, removed with                         a cold biopsy forceps. Resected and retrieved.                        - Diverticulosis in the sigmoid colon.                        - The examination was otherwise normal.                        -  The rectum, sigmoid colon, descending colon,                         transverse colon, ascending colon and cecum are                         normal. Biopsied.                        - The distal rectum and anal verge are normal on                         retroflexion view. Recommendation:        - Await pathology results.                        - Discharge patient to home (with escort).                        - Advance diet as tolerated.                        - Continue present medications.                         - Repeat colonoscopy in 2 years, with 2 day prep.                        - The findings and recommendations were discussed with                         the patient.                        - The findings and recommendations were discussed with                         the patient's family.                        - Return to primary care physician as previously                         scheduled.                        - High fiber diet. Procedure Code(s):     --- Professional ---                        [phone], Colonoscopy, flexible; with biopsy, single or                         multiple Diagnosis Code(s):     --- Professional ---                        Z12.11, Encounter for screening for malignant neoplasm                         of colon  K63.5, Polyp of colon                        K57.30, Diverticulosis of large intestine without                         perforation or abscess without bleeding CPT copyright 2019 American Medical Association. All rights reserved. The codes documented in this report are preliminary and upon coder review may  be revised to meet current compliance requirements.  Vonda Antigua, MD Margretta Sidle B. Bonna Gains MD, MD 01/22/2020 10:11:54 AM This report has been signed electronically. Number of Addenda: 0 Note Initiated On: 01/22/2020 9:16 AM Scope Withdrawal Time: 0 hours 18 minutes 0 seconds  Total Procedure Duration: 0 hours 24 minutes 24 seconds  Estimated Blood Loss:  Estimated blood loss: none.      [facility]

## 2020-01-22 NOTE — Anesthesia Postprocedure Evaluation (Signed)
Anesthesia Post Note  Patient: Charles Beasley  Procedure(s) Performed: COLONOSCOPY WITH PROPOFOL (N/A ) ESOPHAGOGASTRODUODENOSCOPY (EGD) WITH PROPOFOL (N/A )  Patient location during evaluation: Endoscopy Anesthesia Type: General Level of consciousness: awake and alert Pain management: pain level controlled Vital Signs Assessment: post-procedure vital signs reviewed and stable Respiratory status: spontaneous breathing, nonlabored ventilation, respiratory function stable and patient connected to nasal cannula oxygen Cardiovascular status: blood pressure returned to baseline and stable Postop Assessment: no apparent nausea or vomiting Anesthetic complications: no   No complications documented.   Last Vitals:  Vitals:   01/22/20 1029 01/22/20 1039  BP: 97/60 104/69  Pulse: 73 72  Resp: 13 18  Temp:    SpO2: 99% 100%    Last Pain:  Vitals:   01/22/20 1039  TempSrc:   PainSc: 0-No pain                 [name]

## 2020-01-22 NOTE — Transfer of Care (Signed)
Immediate Anesthesia Transfer of Care Note  Patient: Charles Beasley  Procedure(s) Performed: COLONOSCOPY WITH PROPOFOL (N/A ) ESOPHAGOGASTRODUODENOSCOPY (EGD) WITH PROPOFOL (N/A )  Patient Location: Endoscopy Unit  Anesthesia Type:General  Level of Consciousness: drowsy  Airway & Oxygen Therapy: Patient Spontanous Breathing  Post-op Assessment: Report given to RN and Post -op Vital signs reviewed and stable  Post vital signs: Reviewed and stable  Last Vitals:  Vitals Value Taken Time  BP 88/57 01/22/20 1009  Temp 36.6 C 01/22/20 1009  Pulse 74 01/22/20 1011  Resp 15 01/22/20 1011  SpO2 96 % 01/22/20 1011  Vitals shown include unvalidated device data.  Last Pain:  Vitals:   01/22/20 1009  TempSrc: Temporal  PainSc: Asleep      Patients Stated Pain Goal: 0 (58/85/02 7741)  Complications: No complications documented.

## 2020-01-23 ENCOUNTER — Encounter: Payer: Self-pay | Admitting: Gastroenterology

## 2020-01-23 ENCOUNTER — Ambulatory Visit: Payer: Medicaid Other

## 2020-01-26 LAB — SURGICAL PATHOLOGY

## 2020-01-28 ENCOUNTER — Other Ambulatory Visit: Payer: Self-pay | Admitting: Urology

## 2020-02-05 ENCOUNTER — Encounter: Payer: Self-pay | Admitting: Gastroenterology

## 2020-02-10 ENCOUNTER — Encounter: Payer: Self-pay | Admitting: *Deleted

## 2020-02-10 ENCOUNTER — Inpatient Hospital Stay: Payer: PRIVATE HEALTH INSURANCE

## 2020-02-10 ENCOUNTER — Encounter: Payer: Self-pay | Admitting: Internal Medicine

## 2020-02-10 ENCOUNTER — Other Ambulatory Visit: Payer: Self-pay

## 2020-02-10 ENCOUNTER — Inpatient Hospital Stay: Payer: PRIVATE HEALTH INSURANCE | Attending: Internal Medicine | Admitting: Internal Medicine

## 2020-02-10 DIAGNOSIS — Z7984 Long term (current) use of oral hypoglycemic drugs: Secondary | ICD-10-CM | POA: Insufficient documentation

## 2020-02-10 DIAGNOSIS — F329 Major depressive disorder, single episode, unspecified: Secondary | ICD-10-CM | POA: Diagnosis not present

## 2020-02-10 DIAGNOSIS — D729 Disorder of white blood cells, unspecified: Secondary | ICD-10-CM

## 2020-02-10 DIAGNOSIS — Z87891 Personal history of nicotine dependence: Secondary | ICD-10-CM | POA: Diagnosis not present

## 2020-02-10 DIAGNOSIS — Z79899 Other long term (current) drug therapy: Secondary | ICD-10-CM | POA: Diagnosis not present

## 2020-02-10 DIAGNOSIS — F17210 Nicotine dependence, cigarettes, uncomplicated: Secondary | ICD-10-CM | POA: Diagnosis not present

## 2020-02-10 DIAGNOSIS — E1165 Type 2 diabetes mellitus with hyperglycemia: Secondary | ICD-10-CM | POA: Diagnosis not present

## 2020-02-10 DIAGNOSIS — D72829 Elevated white blood cell count, unspecified: Secondary | ICD-10-CM | POA: Insufficient documentation

## 2020-02-10 DIAGNOSIS — Z8551 Personal history of malignant neoplasm of bladder: Secondary | ICD-10-CM | POA: Insufficient documentation

## 2020-02-10 DIAGNOSIS — G8929 Other chronic pain: Secondary | ICD-10-CM | POA: Insufficient documentation

## 2020-02-10 DIAGNOSIS — K589 Irritable bowel syndrome without diarrhea: Secondary | ICD-10-CM | POA: Insufficient documentation

## 2020-02-10 DIAGNOSIS — R109 Unspecified abdominal pain: Secondary | ICD-10-CM | POA: Insufficient documentation

## 2020-02-10 LAB — CBC WITH DIFFERENTIAL/PLATELET
Abs Immature Granulocytes: 0.06 10*3/uL (ref 0.00–0.07)
Basophils Absolute: 0.1 10*3/uL (ref 0.0–0.1)
Basophils Relative: 1 %
Eosinophils Absolute: 0.6 10*3/uL — ABNORMAL HIGH (ref 0.0–0.5)
Eosinophils Relative: 6 %
HCT: 39.8 % (ref 39.0–52.0)
Hemoglobin: 14 g/dL (ref 13.0–17.0)
Immature Granulocytes: 1 %
Lymphocytes Relative: 26 %
Lymphs Abs: 2.7 10*3/uL (ref 0.7–4.0)
MCH: 30.8 pg (ref 26.0–34.0)
MCHC: 35.2 g/dL (ref 30.0–36.0)
MCV: 87.7 fL (ref 80.0–100.0)
Monocytes Absolute: 0.7 10*3/uL (ref 0.1–1.0)
Monocytes Relative: 7 %
Neutro Abs: 6.4 10*3/uL (ref 1.7–7.7)
Neutrophils Relative %: 59 %
Platelets: 381 10*3/uL (ref 150–400)
RBC: 4.54 MIL/uL (ref 4.22–5.81)
RDW: 12.7 % (ref 11.5–15.5)
WBC: 10.6 10*3/uL — ABNORMAL HIGH (ref 4.0–10.5)
nRBC: 0 % (ref 0.0–0.2)

## 2020-02-10 LAB — TECHNOLOGIST SMEAR REVIEW: Plt Morphology: ADEQUATE

## 2020-02-10 LAB — COMPREHENSIVE METABOLIC PANEL WITH GFR
ALT: 18 U/L (ref 0–44)
AST: 20 U/L (ref 15–41)
Albumin: 4.5 g/dL (ref 3.5–5.0)
Alkaline Phosphatase: 64 U/L (ref 38–126)
Anion gap: 11 (ref 5–15)
BUN: 23 mg/dL — ABNORMAL HIGH (ref 6–20)
CO2: 22 mmol/L (ref 22–32)
Calcium: 9.2 mg/dL (ref 8.9–10.3)
Chloride: 105 mmol/L (ref 98–111)
Creatinine, Ser: 0.89 mg/dL (ref 0.61–1.24)
GFR calc Af Amer: >60 mL/min
GFR calc non Af Amer: >60 mL/min
Glucose, Bld: 174 mg/dL — ABNORMAL HIGH (ref 70–99)
Potassium: 4.3 mmol/L (ref 3.5–5.1)
Sodium: 138 mmol/L (ref 135–145)
Total Bilirubin: 0.3 mg/dL (ref 0.3–1.2)
Total Protein: 8.2 g/dL — ABNORMAL HIGH (ref 6.5–8.1)

## 2020-02-10 LAB — C-REACTIVE PROTEIN: CRP: 0.6 mg/dL

## 2020-02-10 LAB — LACTATE DEHYDROGENASE: LDH: 127 U/L (ref 98–192)

## 2020-02-10 NOTE — Progress Notes (Signed)
[facility] CONSULT NOTE  Patient Care Team: [place] as PCP - General  CHIEF COMPLAINTS/PURPOSE OF CONSULTATION: Bladder cancer  #Chronic leukocytosis mild neutrophilia  # February 2021-[facility]-cystoscopy negative; no malignant cells  #Poorly controlled diabetes-hemoglobin A1c February 2021 9.5  Oncology History   No history exists.     HISTORY OF PRESENTING ILLNESS:  Charles Beasley 44 y.o.  male history of smoking/depression; poorly controlled diabetes is here for for initial consultation of leukocytosis/neutrophilia.  Patient was evaluated by me few months ago because of abnormal urine cytology which turned out to be negative for malignancy.  Patient had multiple imaging most recently June 2021-abdomen pelvis CT scan negative for any hepatosplenomegaly or any acute process.  Patient also had a EGD colonoscopy with GI negative any acute process.  Patient fortunately continues smoke.  States his diabetes poorly controlled hemoglobin A1c around 9.  Review of Systems  Constitutional: Positive for malaise/fatigue and weight loss. Negative for chills, diaphoresis and fever.  HENT: Negative for nosebleeds and sore throat.   Eyes: Negative for double vision.  Respiratory: Negative for cough, hemoptysis, sputum production, shortness of breath and wheezing.   Cardiovascular: Negative for chest pain, palpitations, orthopnea and leg swelling.  Gastrointestinal: Positive for abdominal pain and nausea. Negative for blood in stool, constipation, diarrhea, heartburn, melena and vomiting.  Genitourinary: Negative for dysuria, frequency and urgency.  Musculoskeletal: Positive for back pain. Negative for joint pain.  Skin: Negative.  Negative for itching and rash.  Neurological: Negative for dizziness, tingling, focal weakness, weakness and headaches.  Endo/Heme/Allergies: Does not bruise/bleed easily.  Psychiatric/Behavioral: Negative for depression. The  patient is not nervous/anxious and does not have insomnia.      MEDICAL HISTORY:  Past Medical History:  Diagnosis Date  . Asthma   . Bladder cancer ([place])   . Colitis   . Diabetes mellitus without complication ([place])   . Diverticulosis   . History of kidney stones   . IBS (irritable bowel syndrome)     SURGICAL HISTORY: Past Surgical History:  Procedure Laterality Date  . COLONOSCOPY WITH PROPOFOL N/A 01/22/2020   Procedure: COLONOSCOPY WITH PROPOFOL;  Surgeon: Virgel Manifold, MD;  Location: [facility] ENDOSCOPY;  Service: Endoscopy;  Laterality: N/A;  . ESOPHAGOGASTRODUODENOSCOPY (EGD) WITH PROPOFOL N/A 01/22/2020   Procedure: ESOPHAGOGASTRODUODENOSCOPY (EGD) WITH PROPOFOL;  Surgeon: Virgel Manifold, MD;  Location: [facility] ENDOSCOPY;  Service: Endoscopy;  Laterality: N/A;    SOCIAL HISTORY: Social History   Socioeconomic History  . Marital status: Married    Spouse name: Not on file  . Number of children: Not on file  . Years of education: Not on file  . Highest education level: Not on file  Occupational History  . Not on file  Tobacco Use  . Smoking status: Former Smoker    Types: Cigarettes  . Smokeless tobacco: Never Used  Vaping Use  . Vaping Use: Every day  Substance and Sexual Activity  . Alcohol use: No    Alcohol/week: 0.0 standard drinks  . Drug use: No  . Sexual activity: Not on file  Other Topics Concern  . Not on file  Social History Narrative  . Not on file   Social Determinants of Health   Financial Resource Strain:   . Difficulty of Paying Living Expenses:   Food Insecurity:   . Worried About Charity fundraiser in the Last Year:   . Arboriculturist in the Last Year:   [employer]  Needs:   . Lack of Transportation (Medical):   Marland Kitchen Lack of Transportation (Non-Medical):   Physical Activity:   . Days of Exercise per Week:   . Minutes of Exercise per Session:   Stress:   . Feeling of Stress :   Social Connections:   . Frequency of  Communication with Friends and Family:   . Frequency of Social Gatherings with Friends and Family:   . Attends Religious Services:   . Active Member of Clubs or Organizations:   . Attends Archivist Meetings:   Marland Kitchen Marital Status:   Intimate Partner Violence:   . Fear of Current or Ex-Partner:   . Emotionally Abused:   Marland Kitchen Physically Abused:   . Sexually Abused:     FAMILY HISTORY: Family History  Problem Relation Age of Onset  . Breast cancer Mother   . Hypertension Father   . Prostate cancer Neg Hx   . Bladder Cancer Neg Hx   . Kidney cancer Neg Hx     ALLERGIES:  is allergic to montelukast and sertraline.  MEDICATIONS:  Current Outpatient Medications  Medication Sig Dispense Refill  . ACCU-CHEK GUIDE test strip 2 (two) times daily. for testing as directed    . albuterol (PROVENTIL HFA;VENTOLIN HFA) 108 (90 BASE) MCG/ACT inhaler Inhale 2 puffs into the lungs every 6 (six) hours as needed. Reported on 11/08/2015    . busPIRone (BUSPAR) 7.5 MG tablet Take 7.5 mg by mouth 2 (two) times daily.    . citalopram (CELEXA) 20 MG tablet Take 20 mg by mouth daily.    Marland Kitchen glipiZIDE (GLUCOTROL XL) 5 MG 24 hr tablet Take 5 mg by mouth daily.    . metFORMIN (GLUCOPHAGE) 1000 MG tablet Take 1,000 mg by mouth 2 (two) times daily.    Marland Kitchen omeprazole (PRILOSEC) 20 MG capsule Take 20 mg by mouth 2 (two) times daily.    . pantoprazole (PROTONIX) 40 MG tablet Take 40 mg by mouth daily.    . simvastatin (ZOCOR) 20 MG tablet Take 20 mg by mouth at bedtime.    Marland Kitchen oxyCODONE-acetaminophen (PERCOCET) 5-325 MG tablet Take 1 tablet by mouth every 4 (four) hours as needed. (Patient not taking: Reported on 01/22/2020) 20 tablet 0   No current facility-administered medications for this visit.      Marland Kitchen  PHYSICAL EXAMINATION: ECOG PERFORMANCE STATUS: 1 - Symptomatic but completely ambulatory  Vitals:   02/10/20 1002  BP: 128/88  Pulse: 96  Temp: (!) 96.9 F (36.1 C)  SpO2: 97%   Filed Weights    02/10/20 1002  Weight: 155 lb 3.2 oz (70.4 kg)    Physical Exam HENT:     Head: Normocephalic and atraumatic.     Mouth/Throat:     Pharynx: No oropharyngeal exudate.  Eyes:     Pupils: Pupils are equal, round, and reactive to light.  Cardiovascular:     Rate and Rhythm: Normal rate and regular rhythm.  Pulmonary:     Effort: Pulmonary effort is normal. No respiratory distress.     Breath sounds: Normal breath sounds. No wheezing.  Abdominal:     General: Bowel sounds are normal. There is no distension.     Palpations: Abdomen is soft. There is no mass.     Tenderness: There is no abdominal tenderness. There is no guarding or rebound.  Musculoskeletal:        General: No tenderness. Normal range of motion.     Cervical back: Normal range of motion  and neck supple.  Skin:    General: Skin is warm.  Neurological:     Mental Status: He is alert and oriented to person, place, and time.  Psychiatric:        Mood and Affect: Affect normal.      LABORATORY DATA:  I have reviewed the data as listed Lab Results  Component Value Date   WBC 10.6 (H) 02/10/2020   HGB 14.0 02/10/2020   HCT 39.8 02/10/2020   MCV 87.7 02/10/2020   PLT 381 02/10/2020   Recent Labs    09/06/19 1805 09/07/19 0615 11/18/19 1624 11/18/19 1624 01/12/20 1540 01/13/20 1838 02/10/20 1038  NA 139   < > 138   < > 139 138 138  K 3.5   < > 4.0   < > 3.7 4.1 4.3  CL 105   < > 107   < > 105 105 105  CO2 21*   < > 21*   < > 24 24 22   GLUCOSE 95   < > 161*   < > 98 254* 174*  BUN 9   < > 15   < > 15 17 23*  CREATININE 0.85   < > 0.75   < > 0.79 0.92 0.89  CALCIUM 9.3   < > 9.9   < > 9.2 9.0 9.2  GFRNONAA >60   < > >60   < > >60 >60 >60  GFRAA >60   < > >60   < > >60 >60 >60  PROT 7.5  --  8.0  --   --   --  8.2*  ALBUMIN 4.3  --  4.6  --   --   --  4.5  AST 20  --  20  --   --   --  20  ALT 18  --  20  --   --   --  18  ALKPHOS 56  --  66  --   --   --  64  BILITOT 0.7  --  0.6  --   --   --  0.3    < > = values in this interval not displayed.    RADIOGRAPHIC STUDIES: I have personally reviewed the radiological images as listed and agreed with the findings in the report. MR PELVIS W WO CONTRAST  Result Date: 01/14/2020 CLINICAL DATA:  RIGHT lower quadrant abdominal pain, continued abdominal pain EXAM: MRI ABDOMEN AND PELVIS WITHOUT AND WITH CONTRAST TECHNIQUE: Multiplanar multisequence MR imaging of the abdomen and pelvis was performed both before and after the administration of intravenous contrast. CONTRAST:  32mL GADAVIST GADOBUTROL 1 MMOL/ML IV SOLN COMPARISON:  CT evaluation from 01/12/2020 and 11/18/2019 FINDINGS: COMBINED FINDINGS FOR BOTH MR ABDOMEN AND PELVIS Lower chest: Incidental imaging of the lung bases without consolidation or signs of pleural effusion. No pericardial fluid. Hepatobiliary: Cyst in the RIGHT hepatic lobe. Mild hepatic steatosis. No pericholecystic stranding. No biliary ductal dilation. Pancreas:  Normal appearance of the pancreas. Spleen:  Spleen normal size without focal lesion. Adrenals/Urinary Tract:  Adrenal glands are normal. Smooth renal contours with symmetric enhancement. Small calculus seen on the previous exam is not seen on today's MRI. This is present in the LEFT kidney on the previous CT. Stomach/Bowel: Gastrointestinal tract is unremarkable to the extent evaluated on MRI, not protocol for bowel evaluation. No perienteric or pericolonic stranding. No sign of bowel obstruction. Normal appendix. Vascular/Lymphatic: Vascular structures in the abdomen are patent. No retroperitoneal adenopathy.  No pelvic adenopathy. Reproductive: Prostate with heterogeneous signal on T2, nonspecific. Correlate with any clinical evidence of prostatitis. Other:  No ascites. Musculoskeletal: No suspicious bone lesions identified. IMPRESSION: 1. No evidence of bowel obstruction or other acute findings within the abdomen or pelvis. The appendix is normal. 2. No biliary duct dilation or  pericholecystic stranding. 3. Mild hepatic steatosis. 4. Nonspecific mild heterogeneity of the prostate not well evaluated, correlate with any clinical signs of prostatitis. 5. Study not adequate for renal or ureteral calculus evaluation though there is no hydronephrosis or perinephric stranding. Electronically Signed   By: Zetta Bills M.D.   On: 01/14/2020 08:25   MR Abdomen W or Wo Contrast  Result Date: 01/14/2020 CLINICAL DATA:  RIGHT lower quadrant abdominal pain, continued abdominal pain EXAM: MRI ABDOMEN AND PELVIS WITHOUT AND WITH CONTRAST TECHNIQUE: Multiplanar multisequence MR imaging of the abdomen and pelvis was performed both before and after the administration of intravenous contrast. CONTRAST:  45mL GADAVIST GADOBUTROL 1 MMOL/ML IV SOLN COMPARISON:  CT evaluation from 01/12/2020 and 11/18/2019 FINDINGS: COMBINED FINDINGS FOR BOTH MR ABDOMEN AND PELVIS Lower chest: Incidental imaging of the lung bases without consolidation or signs of pleural effusion. No pericardial fluid. Hepatobiliary: Cyst in the RIGHT hepatic lobe. Mild hepatic steatosis. No pericholecystic stranding. No biliary ductal dilation. Pancreas:  Normal appearance of the pancreas. Spleen:  Spleen normal size without focal lesion. Adrenals/Urinary Tract:  Adrenal glands are normal. Smooth renal contours with symmetric enhancement. Small calculus seen on the previous exam is not seen on today's MRI. This is present in the LEFT kidney on the previous CT. Stomach/Bowel: Gastrointestinal tract is unremarkable to the extent evaluated on MRI, not protocol for bowel evaluation. No perienteric or pericolonic stranding. No sign of bowel obstruction. Normal appendix. Vascular/Lymphatic: Vascular structures in the abdomen are patent. No retroperitoneal adenopathy. No pelvic adenopathy. Reproductive: Prostate with heterogeneous signal on T2, nonspecific. Correlate with any clinical evidence of prostatitis. Other:  No ascites. Musculoskeletal:  No suspicious bone lesions identified. IMPRESSION: 1. No evidence of bowel obstruction or other acute findings within the abdomen or pelvis. The appendix is normal. 2. No biliary duct dilation or pericholecystic stranding. 3. Mild hepatic steatosis. 4. Nonspecific mild heterogeneity of the prostate not well evaluated, correlate with any clinical signs of prostatitis. 5. Study not adequate for renal or ureteral calculus evaluation though there is no hydronephrosis or perinephric stranding. Electronically Signed   By: Zetta Bills M.D.   On: 01/14/2020 08:25   CT Abdomen Pelvis W Contrast  Result Date: 01/12/2020 CLINICAL DATA:  Right lower quadrant/right flank pain. EXAM: CT ABDOMEN AND PELVIS WITH CONTRAST TECHNIQUE: Multidetector CT imaging of the abdomen and pelvis was performed using the standard protocol following bolus administration of intravenous contrast. CONTRAST:  125mL OMNIPAQUE IOHEXOL 300 MG/ML  SOLN COMPARISON:  Multiple prior CTs, most recently noncontrast CT 11/18/2019. Contrast-enhanced CT 10/12/2019 FINDINGS: Lower chest: The lung bases are clear. Hepatobiliary: Unchanged subcentimeter hypodensity in the right lobe of the liver. Background hepatic steatosis. Gallbladder physiologically distended, no calcified stone. No biliary dilatation. Pancreas: No ductal dilatation or inflammation. Spleen: Normal in size without focal abnormality. Adrenals/Urinary Tract: Normal adrenal glands. Previous right proximal ureteral calculus has passed. No hydronephrosis or perinephric edema. Homogeneous renal enhancement with symmetric excretion on delayed phase imaging. Again seen nonobstructing stone in the left kidney. Urinary bladder is minimally distended, unremarkable for degree of distension. Stomach/Bowel: Nondistended stomach. There is no small bowel obstruction or inflammatory change. Normal appendix, for example series  5, image 39. Distal colonic diverticulosis without diverticulitis. Suggestion of  mild mural hypertrophy in the sigmoid. Moderate stool burden in the proximal colon. Vascular/Lymphatic: Aorto bi-iliac atherosclerosis without aneurysm. No enlarged lymph nodes in the abdomen or pelvis. Reproductive: Prostate is unremarkable. Other: No free air, free fluid, or intra-abdominal fluid collection. Musculoskeletal: There are no acute or suspicious osseous abnormalities. IMPRESSION: 1. No acute abnormality in the abdomen/pelvis. Normal appendix. 2. Previous right proximal ureteral calculus has passed. No hydronephrosis or obstructive uropathy. 3. Nonobstructing left renal stone. 4. Colonic diverticulosis without diverticulitis. 5. Hepatic steatosis. Aortic Atherosclerosis (ICD10-I70.0). Electronically Signed   By: Keith Rake M.D.   On: 01/12/2020 20:21    ASSESSMENT & PLAN:   Neutrophilia # Leucocytosis/Neutrophilia-White count chronic intermittent 11-19.  Normal hemoglobin/platelets.  Suspect secondary to infection/inflammation rather than any malignant process.  Will rule out malignant process like CML.  Will check BCR ABL; peripheral smear.  #Chronic abdominal pain /constipation unclear etiology -reviewed EGD/[facility]- 6/24- NEG. defer to GI for further evaluation  #fatigue- poorly controlled diabetes-poorly controlled-hemoglobin A1c 9.5.  Recommend follow-up with PCP regarding management.  # Smoking-counseled to quit smoking.  # DISPOSITION: will call with results.  # labs- today.  # follow up TBD-dr.B  Cc; PCP.   All questions were answered. The patient knows to call the clinic with any problems, questions or concerns.   Cammie Sickle, MD 02/10/2020 1:13 PM

## 2020-02-10 NOTE — Assessment & Plan Note (Addendum)
#   Leucocytosis/Neutrophilia-White count chronic intermittent 11-19.  Normal hemoglobin/platelets.  Suspect secondary to infection/inflammation rather than any malignant process.  Will rule out malignant process like CML.  Will check BCR ABL; peripheral smear.  #Chronic abdominal pain /constipation unclear etiology -reviewed EGD/Colo- 6/24- NEG. defer to GI for further evaluation  #fatigue- poorly controlled diabetes-poorly controlled-hemoglobin A1c 9.5.  Recommend follow-up with PCP regarding management.  # Smoking-counseled to quit smoking.  # DISPOSITION: will call with results.  # labs- today.  # follow up TBD-dr.B  Cc; PCP.

## 2020-02-10 NOTE — Progress Notes (Signed)
Patient here today for follow up. C/o right side pain states that has frequently for some time now. Also states he has trouble sleeping. Currently taking melatonin but states doesn't help. Reports some nausea and constipation on occasion.

## 2020-02-16 LAB — BCR-ABL1 FISH
Cells Analyzed: 200
Cells Counted: 200

## 2020-02-17 ENCOUNTER — Telehealth (INDEPENDENT_AMBULATORY_CARE_PROVIDER_SITE_OTHER): Payer: PRIVATE HEALTH INSURANCE | Admitting: Gastroenterology

## 2020-02-17 DIAGNOSIS — R109 Unspecified abdominal pain: Secondary | ICD-10-CM

## 2020-02-17 DIAGNOSIS — G8929 Other chronic pain: Secondary | ICD-10-CM | POA: Diagnosis not present

## 2020-02-17 MED ORDER — RIFAXIMIN 550 MG PO TABS: 550.0000 mg | ORAL_TABLET | Freq: Three times a day (TID) | ORAL | 0 refills | Status: AC

## 2020-02-17 NOTE — Progress Notes (Signed)
Charles Antigua, MD [address]  [place]  [place], [place] [address]  Main: [phone]  Fax: [phone]   Primary Care Physician: [facility]  Virtual Visit via Telephone Note  I connected with patient on 02/17/20 at  2:30 PM EDT by telephone and verified that I am speaking with the correct person using two identifiers.   I discussed the limitations, risks, security and privacy concerns of performing an evaluation and management service by telephone and the availability of in person appointments. I also discussed with the patient that there may be a patient responsible charge related to this service. The patient expressed understanding and agreed to proceed.  Location of Patient: Home Location of Provider: Home Persons involved: Patient and provider only during the visit (nursing staff and front desk staff was involved in communicating with the patient prior to the appointment, reviewing medications and checking them in)   History of Present Illness: Chief Complaint  Patient presents with  . Leukocytosis    Patient had been seen by Dr. Rogue Bussing.     HPI: Charles Beasley is a 44 y.o. male here for follow-up of abdominal pain.  Underwent EGD and colonoscopy which was unrevealing including biopsies.  Has had chronic symptoms for years and has previously been told that he has IBS and was asked to take Imodium.  Continues to take Metamucil daily.  Continues to have days without bowel movements, and some days with 10 bowel movements a day.  No blood in stool.  No weight loss.  No nausea or vomiting.  Continues to have abdominal bloating.   Current Outpatient Medications  Medication Sig Dispense Refill  . ACCU-CHEK GUIDE test strip 2 (two) times daily. for testing as directed    . busPIRone (BUSPAR) 7.5 MG tablet Take 7.5 mg by mouth 2 (two) times daily.    . citalopram (CELEXA) 20 MG tablet Take 20 mg by mouth daily.    Marland Kitchen glipiZIDE (GLUCOTROL XL) 5 MG 24 hr  tablet Take 5 mg by mouth daily.    . metFORMIN (GLUCOPHAGE) 1000 MG tablet Take 1,000 mg by mouth 2 (two) times daily.    Marland Kitchen oxybutynin (DITROPAN-XL) 10 MG 24 hr tablet Take 10 mg by mouth daily.    . simvastatin (ZOCOR) 20 MG tablet Take 20 mg by mouth at bedtime.    Marland Kitchen albuterol (PROVENTIL HFA;VENTOLIN HFA) 108 (90 BASE) MCG/ACT inhaler Inhale 2 puffs into the lungs every 6 (six) hours as needed. Reported on 11/08/2015 (Patient not taking: Reported on 02/17/2020)    . omeprazole (PRILOSEC) 20 MG capsule Take 20 mg by mouth 2 (two) times daily. (Patient not taking: Reported on 02/17/2020)    . oxyCODONE-acetaminophen (PERCOCET) 5-325 MG tablet Take 1 tablet by mouth every 4 (four) hours as needed. (Patient not taking: Reported on 01/22/2020) 20 tablet 0  . pantoprazole (PROTONIX) 40 MG tablet Take 40 mg by mouth daily. (Patient not taking: Reported on 02/17/2020)    . rifaximin (XIFAXAN) 550 MG TABS tablet Take 1 tablet (550 mg total) by mouth 3 (three) times daily for 14 days. 42 tablet 0   No current facility-administered medications for this visit.    Allergies as of 02/17/2020 - Review Complete 02/17/2020  Allergen Reaction Noted  . Montelukast Photosensitivity 11/08/2015  . Sertraline Other (See Comments) 11/08/2015    Review of Systems:    All systems reviewed and negative except where noted in HPI.   Observations/Objective:  Labs: CMP  Component Value Date/Time   NA 138 02/10/2020 1038   K 4.3 02/10/2020 1038   CL 105 02/10/2020 1038   CO2 22 02/10/2020 1038   GLUCOSE 174 (H) 02/10/2020 1038   BUN 23 (H) 02/10/2020 1038   CREATININE 0.89 02/10/2020 1038   CALCIUM 9.2 02/10/2020 1038   PROT 8.2 (H) 02/10/2020 1038   ALBUMIN 4.5 02/10/2020 1038   AST 20 02/10/2020 1038   ALT 18 02/10/2020 1038   ALKPHOS 64 02/10/2020 1038   BILITOT 0.3 02/10/2020 1038   GFRNONAA >60 02/10/2020 1038   GFRAA >60 02/10/2020 1038   Lab Results  Component Value Date   WBC 10.6 (H)  02/10/2020   HGB 14.0 02/10/2020   HCT 39.8 02/10/2020   MCV 87.7 02/10/2020   PLT 381 02/10/2020    Imaging Studies: No results found.  Assessment and Plan:   Charles Beasley is a 44 y.o. y/o male here for follow-up of chronic abdominal pain  Assessment and Plan: Patient symptoms most consistent with IBS, diarrhea predominant  He has not responded well to Metamucil, and Imodium as needed  We will try rifaximin, 550 mg 3 times daily for 14 days  Patient reassured about biopsy findings  Follow Up Instructions:    I discussed the assessment and treatment plan with the patient. The patient was provided an opportunity to ask questions and all were answered. The patient agreed with the plan and demonstrated an understanding of the instructions.   The patient was advised to call back or seek an in-person evaluation if the symptoms worsen or if the condition fails to improve as anticipated.  I provided 12 minutes of non-face-to-face time during this encounter. Additional time was spent in reviewing patient's chart, placing orders etc.   Virgel Manifold, MD  Speech recognition software was used to dictate this note.

## 2020-02-24 ENCOUNTER — Telehealth: Payer: Self-pay | Admitting: Internal Medicine

## 2020-02-24 NOTE — Telephone Encounter (Signed)
On 7/27-I tried to reach the patient discussed labs.  Unable to leave a voicemail.  H- Please inform patient that his work-up for elevated white count-is negative for any leukemia or cancer.  Mildly elevated white count secondary to smoking.  Recommend quitting smoking.  At this time would not recommend any further work-up.  Recommend follow-up only as needed.

## 2020-03-01 ENCOUNTER — Encounter: Payer: Self-pay | Admitting: *Deleted

## 2020-03-01 NOTE — Telephone Encounter (Signed)
Mychart msg sent to pt.

## 2020-03-02 ENCOUNTER — Other Ambulatory Visit: Payer: Self-pay

## 2020-03-02 ENCOUNTER — Encounter: Payer: Self-pay | Admitting: Intensive Care

## 2020-03-02 ENCOUNTER — Emergency Department
Admission: EM | Admit: 2020-03-02 | Discharge: 2020-03-02 | Disposition: A | Discharge status: Home / Self Care | Payer: PRIVATE HEALTH INSURANCE | Attending: Emergency Medicine | Admitting: Emergency Medicine

## 2020-03-02 ENCOUNTER — Ambulatory Visit: Admit: 2020-03-02 | Discharge status: Against Medical Advice | Payer: PRIVATE HEALTH INSURANCE

## 2020-03-02 ENCOUNTER — Emergency Department: Payer: PRIVATE HEALTH INSURANCE

## 2020-03-02 DIAGNOSIS — Z20822 Contact with and (suspected) exposure to covid-19: Secondary | ICD-10-CM | POA: Diagnosis not present

## 2020-03-02 DIAGNOSIS — E119 Type 2 diabetes mellitus without complications: Secondary | ICD-10-CM | POA: Insufficient documentation

## 2020-03-02 DIAGNOSIS — Z79899 Other long term (current) drug therapy: Secondary | ICD-10-CM | POA: Insufficient documentation

## 2020-03-02 DIAGNOSIS — Z8551 Personal history of malignant neoplasm of bladder: Secondary | ICD-10-CM | POA: Insufficient documentation

## 2020-03-02 DIAGNOSIS — K219 Gastro-esophageal reflux disease without esophagitis: Secondary | ICD-10-CM | POA: Insufficient documentation

## 2020-03-02 DIAGNOSIS — J45909 Unspecified asthma, uncomplicated: Secondary | ICD-10-CM | POA: Diagnosis not present

## 2020-03-02 DIAGNOSIS — R112 Nausea with vomiting, unspecified: Secondary | ICD-10-CM | POA: Insufficient documentation

## 2020-03-02 DIAGNOSIS — R109 Unspecified abdominal pain: Secondary | ICD-10-CM

## 2020-03-02 DIAGNOSIS — Z7984 Long term (current) use of oral hypoglycemic drugs: Secondary | ICD-10-CM | POA: Insufficient documentation

## 2020-03-02 DIAGNOSIS — Z87891 Personal history of nicotine dependence: Secondary | ICD-10-CM | POA: Diagnosis not present

## 2020-03-02 DIAGNOSIS — R059 Cough, unspecified: Secondary | ICD-10-CM

## 2020-03-02 DIAGNOSIS — R05 Cough: Secondary | ICD-10-CM

## 2020-03-02 DIAGNOSIS — R1031 Right lower quadrant pain: Secondary | ICD-10-CM | POA: Diagnosis not present

## 2020-03-02 DIAGNOSIS — R0602 Shortness of breath: Secondary | ICD-10-CM

## 2020-03-02 DIAGNOSIS — R519 Headache, unspecified: Secondary | ICD-10-CM | POA: Insufficient documentation

## 2020-03-02 HISTORY — DX: Gastro-esophageal reflux disease without esophagitis: K21.9

## 2020-03-02 LAB — URINALYSIS, COMPLETE (UACMP) WITH MICROSCOPIC
Bacteria, UA: NONE SEEN
Bilirubin Urine: NEGATIVE
Glucose, UA: NEGATIVE mg/dL
Hgb urine dipstick: NEGATIVE
Ketones, ur: NEGATIVE mg/dL
Leukocytes,Ua: NEGATIVE
Nitrite: NEGATIVE
Protein, ur: NEGATIVE mg/dL
Specific Gravity, Urine: 1.021 (ref 1.005–1.030)
Squamous Epithelial / HPF: NONE SEEN (ref 0–5)
pH: 5 (ref 5.0–8.0)

## 2020-03-02 LAB — COMPREHENSIVE METABOLIC PANEL
ALT: 16 U/L (ref 0–44)
AST: 21 U/L (ref 15–41)
Albumin: 4.6 g/dL (ref 3.5–5.0)
Alkaline Phosphatase: 58 U/L (ref 38–126)
Anion gap: 12 (ref 5–15)
BUN: 17 mg/dL (ref 6–20)
CO2: 20 mmol/L — ABNORMAL LOW (ref 22–32)
Calcium: 9.5 mg/dL (ref 8.9–10.3)
Chloride: 108 mmol/L (ref 98–111)
Creatinine, Ser: 0.91 mg/dL (ref 0.61–1.24)
GFR calc Af Amer: >60 mL/min (ref >60)
GFR calc non Af Amer: >60 mL/min (ref >60)
Glucose, Bld: 86 mg/dL (ref 70–99)
Potassium: 4 mmol/L (ref 3.5–5.1)
Sodium: 140 mmol/L (ref 135–145)
Total Bilirubin: 0.4 mg/dL (ref 0.3–1.2)
Total Protein: 7.9 g/dL (ref 6.5–8.1)

## 2020-03-02 LAB — SARS CORONAVIRUS 2 BY RT PCR (HOSPITAL ORDER, PERFORMED IN ~~LOC~~ HOSPITAL LAB): SARS Coronavirus 2: NEGATIVE

## 2020-03-02 LAB — CBC
HCT: 41.2 % (ref 39.0–52.0)
Hemoglobin: 13.9 g/dL (ref 13.0–17.0)
MCH: 30.9 pg (ref 26.0–34.0)
MCHC: 33.7 g/dL (ref 30.0–36.0)
MCV: 91.6 fL (ref 80.0–100.0)
Platelets: 399 10*3/uL (ref 150–400)
RBC: 4.5 MIL/uL (ref 4.22–5.81)
RDW: 13.2 % (ref 11.5–15.5)
WBC: 13.4 10*3/uL — ABNORMAL HIGH (ref 4.0–10.5)
nRBC: 0 % (ref 0.0–0.2)

## 2020-03-02 LAB — LIPASE, BLOOD: Lipase: 29 U/L (ref 11–51)

## 2020-03-02 MED ORDER — HYDROCODONE-ACETAMINOPHEN 5-325 MG PO TABS
1.0000 | ORAL_TABLET | Freq: Once | ORAL | Status: AC
  Administered 2020-03-02: 1 via ORAL
  Filled 2020-03-02: qty 1

## 2020-03-02 MED ORDER — OXYCODONE-ACETAMINOPHEN 5-325 MG PO TABS
1.0000 | ORAL_TABLET | ORAL | Status: DC | PRN
  Administered 2020-03-02: 1 via ORAL
  Filled 2020-03-02: qty 1

## 2020-03-02 NOTE — ED Provider Notes (Signed)
[facility] Emergency Department Provider Note ____________________________________________  Time seen: 1945  I have reviewed the triage vital signs and the nursing notes.  HISTORY  Chief Complaint  Flank Pain and Emesis   HPI Charles Beasley is a 44 y.o. male presents to the ER with complaint of sudden onset of headache, ear fullness, scratchy throat, cough, shortness of breath, right flank pain, nausea and vomiting at 3:00 this morning.  He reports he was initially woken up by the right flank pain which he describes as sharp and stabbing..  The pain radiates around to the right side of his abdomen.  He does have some chronic burning with urination but denies urgency, frequency or blood in his urine.  He denies penile lesion, penile discharge or testicular pain/swelling.  He does have a history of kidney stones.  The headache is located in his forehead.  He describes the pain as pulsating.  He reports some sensitivity to sound but denies dizziness, visual changes, sensitivity to light or neck pain.  He denies any difficulty swallowing.  The cough is productive of yellow/brown mucus.  He is mildly SOB with exertion but denies SOB at rest.  He denies runny nose, nasal congestion, loss of taste/smell, fever, chills or body aches.  He does vape.  He has a history of asthma but does not use any daily inhalers for this.  He has not had sick contacts but had had positive Covid exposure.  He has not had his Covid vaccine.  Past Medical History:  Diagnosis Date  . Asthma   . Bladder cancer ([place])   . Colitis   . Diabetes mellitus without complication ([place])   . Diverticulosis   . GERD (gastroesophageal reflux disease)   . History of kidney stones   . IBS (irritable bowel syndrome)     Patient Active Problem List   Diagnosis Date Noted  . Neutrophilia 02/10/2020  . Generalized abdominal pain   . Encounter for screening colonoscopy   . Polyp of colon   . Ureterolithiasis  11/19/2019  . Intractable pain 11/18/2019  . Renal colic on right side [id number]/57/0177  . Ureteral calculus, right 11/18/2019  . History of colitis 11/18/2019  . UTI (urinary tract infection) 11/18/2019  . Abnormal urine cytology 09/30/2019  . Colitis 09/06/2019  . Diabetes mellitus without complication ([place])   . Asthma   . Dyshidrotic hand dermatitis 08/29/2018  . Aortic atherosclerosis ([place]) 03/09/2017  . Calculus of kidney 11/28/2016  . Diet-controlled type 2 diabetes mellitus ([place]) 06/21/2016  . Tinnitus 03/12/2014  . Carpal tunnel syndrome of right wrist 03/12/2014  . Depression 02/11/2014  . Allergic rhinitis 04/21/2013  . Gastroesophageal reflux disease without esophagitis 02/21/2009  . IBS (irritable bowel syndrome) 07/31/1989    Past Surgical History:  Procedure Laterality Date  . COLONOSCOPY WITH PROPOFOL N/A 01/22/2020   Procedure: COLONOSCOPY WITH PROPOFOL;  Surgeon: Virgel Manifold, MD;  Location: [facility] ENDOSCOPY;  Service: Endoscopy;  Laterality: N/A;  . ESOPHAGOGASTRODUODENOSCOPY (EGD) WITH PROPOFOL N/A 01/22/2020   Procedure: ESOPHAGOGASTRODUODENOSCOPY (EGD) WITH PROPOFOL;  Surgeon: Virgel Manifold, MD;  Location: [facility] ENDOSCOPY;  Service: Endoscopy;  Laterality: N/A;    Prior to Admission medications   Medication Sig Start Date End Date Taking? Authorizing Provider  ACCU-CHEK GUIDE test strip 2 (two) times daily. for testing as directed 11/05/19   [provider]  albuterol (PROVENTIL HFA;VENTOLIN HFA) 108 (90 BASE) MCG/ACT inhaler Inhale 2 puffs into the lungs every 6 (six) hours as needed.  Reported on 11/08/2015 Patient not taking: Reported on 02/17/2020    [provider]  busPIRone (BUSPAR) 7.5 MG tablet Take 7.5 mg by mouth 2 (two) times daily. 08/29/19   [provider]  citalopram (CELEXA) 20 MG tablet Take 20 mg by mouth daily. 11/05/19   [provider]  glipiZIDE (GLUCOTROL XL) 5 MG 24 hr tablet Take 5 mg by mouth daily.  09/02/19   [provider]  metFORMIN (GLUCOPHAGE) 1000 MG tablet Take 1,000 mg by mouth 2 (two) times daily. 08/19/19   [provider]  omeprazole (PRILOSEC) 20 MG capsule Take 20 mg by mouth 2 (two) times daily. Patient not taking: Reported on 02/17/2020 08/25/19   [provider]  oxybutynin (DITROPAN-XL) 10 MG 24 hr tablet Take 10 mg by mouth daily. 02/09/20   [provider]  pantoprazole (PROTONIX) 40 MG tablet Take 40 mg by mouth daily. Patient not taking: Reported on 02/17/2020 11/24/19   [provider]  rifaximin (XIFAXAN) 550 MG TABS tablet Take 1 tablet (550 mg total) by mouth 3 (three) times daily for 14 days. 02/17/20 03/02/20  Virgel Manifold, MD  simvastatin (ZOCOR) 20 MG tablet Take 20 mg by mouth at bedtime. 08/16/19   [provider]    Allergies Montelukast and Sertraline  Family History  Problem Relation Age of Onset  . Breast cancer Mother   . Hypertension Father   . Prostate cancer Neg Hx   . Bladder Cancer Neg Hx   . Kidney cancer Neg Hx     Social History Social History   Tobacco Use  . Smoking status: Former Smoker    Types: Cigarettes  . Smokeless tobacco: Never Used  Vaping Use  . Vaping Use: Every day  Substance Use Topics  . Alcohol use: No    Alcohol/week: 0.0 standard drinks  . Drug use: No    Review of Systems  Constitutional: Negative for fever, chills or body aches. Eyes: Negative for visual changes. ENT: Positive for scratchy throat, ear fullness. Negative for runny nose, nasal congestion, loss of taste/smell orsore throat. Cardiovascular: Negative for chest pain or chest tightness. Respiratory:Positive for cough and shortness of breath. Gastrointestinal: Positive for RLQ abdominal pain, nausea and vomiting. Negative for constipation, diarrhea or blood in the stool. Genitourinary: Positive for burning with urination. Denies frequency, dysuria, blood in the urine, penile discharge, penile  lesion or testicular pain/swelling. Musculoskeletal: Positive for RLQ back pain. Negative for decreased ROM, joint swelling, difficulty with gait. Skin: Negative for rash. Neurological: Positive for headache. Negative for focal weakness, tingling or numbness. ____________________________________________  PHYSICAL EXAM:  VITAL SIGNS: ED Triage Vitals  Enc Vitals Group     BP 03/02/20 1703 139/90     Pulse Rate 03/02/20 1703 98     Resp 03/02/20 1703 18     Temp 03/02/20 1703 98.5 F (36.9 C)     Temp Source 03/02/20 1703 Oral     SpO2 03/02/20 1703 98 %     Weight 03/02/20 1703 156 lb (70.8 kg)     Height 03/02/20 1703 5\' 9"  (1.753 m)     Head Circumference --      Peak Flow --      Pain Score 03/02/20 1713 8     Pain Loc --      Pain Edu? --      Excl. in [place]? --     Constitutional: Alert and oriented. Appears in pain but in no distress. Head: Normocephalic.  Eyes: Conjunctivae are normal. PERRL. Normal extraocular movements Mouth/Throat: Mucous membranes are moist. No posterior pharynx erythema or exudate noted. Hematological/Lymphatic/Immunological: No cervical lymphadenopathy. Cardiovascular: Normal rate, regular rhythm.  Respiratory: Normal respiratory effort. No wheezes/rales/rhonchi. Gastrointestinal: Soft and nontender. No distention. + CVA tenderness noted on the right. Neurologic:  Normal gait without ataxia. Normal speech and language. No gross focal neurologic deficits are appreciated. Skin:  Skin is warm, dry and intact. No rash noted. Psychiatric: Mood and affect are normal. Patient exhibits appropriate insight and judgment. ____________________________________________   LABS  Labs Reviewed  COMPREHENSIVE METABOLIC PANEL - Abnormal; Notable for the following components:      Result Value   CO2 20 (*)    All other components within normal limits  CBC - Abnormal; Notable for the following components:   WBC 13.4 (*)    All other components within normal  limits  URINALYSIS, COMPLETE (UACMP) WITH MICROSCOPIC - Abnormal; Notable for the following components:   Color, Urine YELLOW (*)    APPearance CLEAR (*)    All other components within normal limits  SARS CORONAVIRUS 2 BY RT PCR (HOSPITAL ORDER, [facility])  LIPASE, BLOOD    ____________________________________________  RADIOLOGY  Imaging Orders     CT Renal Stone Study IMPRESSION:  1. Negative for hydronephrosis or ureteral stone. Punctate  nonobstructing stones within both kidneys.  2. Left colon diverticular disease without acute inflammatory  process.    ____________________________________________  INITIAL IMPRESSION / ASSESSMENT AND PLAN / ED COURSE  Right Flank Pain, RLQ Abdominal Pain, Nausea, Vomiting, Acute Headache, Cough, SOB, Exposure to Covid:  DDx include acute pyelonephritis, UTI, Kidney Stone, viral URI with Cough, Asthma Exacerbation, Covid Covid swab pending- he understands he will be called with a positive result or he can look on Mychart for the result CBC, CMET, Lipase shows leukocytosis- chronic per Epic review Urinalysis negative Hydrocodone 5-325 mg PO x 1 Encouraged rest, fluids, Tylenol or Ibuprofen OTC Encouraged self quarantine, frequent handwashing, masking and social distancing even in the home until Covid result is back Encouraged him to get a Covid vaccine Follow up with PCP as an outpatient    I reviewed the patient's prescription history over the last 12 months in the multi-state controlled substances database(s) that includes [place], [place], [place], [place], [place], [place], [place], [place], [place], [place], [place], [place], [place], and [place].  Results were notable for Percocet 5-325 mg #20 01/14/20 ____________________________________________  FINAL CLINICAL IMPRESSION(S) / ED DIAGNOSES  Final diagnoses:  Right flank pain  RLQ abdominal pain  Intractable  vomiting with nausea, unspecified vomiting type  Acute intractable headache, unspecified headache type  Cough  SOB (shortness of breath)  Exposure to COVID-19 virus      Jearld Fenton, NP 03/02/20 2059    Harvest Dark, MD 03/02/20 2310

## 2020-03-02 NOTE — Discharge Instructions (Addendum)
You were seen today for right flank pain in addition to upper respiratory symptoms.  Your CT scan was negative for acute kidney stone.  Your urinalysis is negative for infection.  Other labs were unremarkable.  Your Covid swab is pending.  They will call you with those results if it is positive.  You may also look on my chart for the result.  At this point we recommend symptomatic care, Tylenol or ibuprofen OTC as needed for pain.  You may take Delsym OTC as needed for cough.  You may use your albuterol inhaler if needed for shortness of breath.  It is important that you self quarantine, wash her hands frequently, wear your mask and social distancing even in the home until your Covid results are back.  Please go get a Covid vaccine

## 2020-03-02 NOTE — ED Triage Notes (Signed)
Patient c/o right sided flank pain with emesis since 0300 today. Also reports headache and productive cough with yellow/brown phlegm. Reports he has had elevated WBC count X3 years and sees MD.

## 2020-03-12 ENCOUNTER — Telehealth: Payer: Self-pay

## 2020-03-12 NOTE — Telephone Encounter (Signed)
We received a refill request on patient's Xifaxan 550 MG. I then called patient and had to leave him a detailed message asking if he could call us back to know if this medication had helped him or not. Looking at his chart, I stumbled upon him visiting the [facility] ED on 03/09/2020. Patient's sugars have been elevated among headaches, vision disturbances and abdominal pain. Patient's labs and images were reassuring according to the hospitalist. Patient was referred to a primary care doctor. Waiting for patient to call me back if he needed the refill or not.

## 2020-03-24 NOTE — Telephone Encounter (Signed)
Calle dpatient again since we received another refill request for his Xifaxan. However, he did not answer therefore I left him another voicemail to call us back in order to get a refill.

## 2020-04-19 ENCOUNTER — Other Ambulatory Visit: Payer: PRIVATE HEALTH INSURANCE

## 2020-04-19 ENCOUNTER — Other Ambulatory Visit: Payer: Self-pay | Admitting: Critical Care Medicine

## 2020-04-19 DIAGNOSIS — Z20822 Contact with and (suspected) exposure to covid-19: Secondary | ICD-10-CM

## 2020-04-20 ENCOUNTER — Encounter: Payer: Self-pay | Admitting: Nurse Practitioner

## 2020-04-20 ENCOUNTER — Other Ambulatory Visit: Payer: Self-pay | Admitting: Nurse Practitioner

## 2020-04-20 DIAGNOSIS — U071 COVID-19: Secondary | ICD-10-CM

## 2020-04-20 LAB — SARS-COV-2, NAA 2 DAY TAT

## 2020-04-20 LAB — NOVEL CORONAVIRUS, NAA: SARS-CoV-2, NAA: NOT DETECTED

## 2020-04-20 NOTE — Progress Notes (Signed)
I connected by phone with Charles Beasley on 04/20/2020 at 4:40 PM to discuss the potential use of an new treatment for mild to moderate COVID-19 viral infection in non-hospitalized patients.  This patient is a 44 y.o. male that meets the FDA criteria for Emergency Use Authorization of casirivimab\imdevimab.  Has a (+) direct SARS-CoV-2 viral test result  Has mild or moderate COVID-19   Is ? 44 years of age and weighs ? 40 kg  Is NOT hospitalized due to COVID-19  Is NOT requiring oxygen therapy or requiring an increase in baseline oxygen flow rate due to COVID-19  Is within 10 days of symptom onset  Has at least one of the high risk factor(s) for progression to severe COVID-19 and/or hospitalization as defined in EUA.  Specific high risk criteria : Diabetes   Onset 9/19. Unvaccinated. Wife and daughter are positive. Positive home test.    I have spoken and communicated the following to the patient or parent/caregiver:  1. FDA has authorized the emergency use of casirivimab\imdevimab for the treatment of mild to moderate COVID-19 in adults and pediatric patients with positive results of direct SARS-CoV-2 viral testing who are 52 years of age and older weighing at least 40 kg, and who are at high risk for progressing to severe COVID-19 and/or hospitalization.  2. The significant known and potential risks and benefits of casirivimab\imdevimab, and the extent to which such potential risks and benefits are unknown.  3. Information on available alternative treatments and the risks and benefits of those alternatives, including clinical trials.  4. Patients treated with casirivimab\imdevimab should continue to self-isolate and use infection control measures (e.g., wear mask, isolate, social distance, avoid sharing personal items, clean and disinfect "high touch" surfaces, and frequent handwashing) according to CDC guidelines.   5. The patient or parent/caregiver has the option to accept or  refuse casirivimab\imdevimab .  After reviewing this information with the patient, The patient agreed to proceed with receiving casirivimab\imdevimab infusion and will be provided a copy of the Fact sheet prior to receiving the infusion.[name], [place], AGNP-C [phone] ([place])

## 2020-04-21 ENCOUNTER — Ambulatory Visit (HOSPITAL_COMMUNITY)
Admission: RE | Admit: 2020-04-21 | Discharge: 2020-04-21 | Disposition: A | Discharge status: Home / Self Care | Payer: PRIVATE HEALTH INSURANCE | Source: Ambulatory Visit | Attending: Pulmonary Disease | Admitting: Pulmonary Disease

## 2020-04-21 ENCOUNTER — Other Ambulatory Visit (HOSPITAL_COMMUNITY): Payer: Self-pay

## 2020-04-21 DIAGNOSIS — U071 COVID-19: Secondary | ICD-10-CM | POA: Diagnosis not present

## 2020-04-21 MED ORDER — DIPHENHYDRAMINE HCL 50 MG/ML IJ SOLN: 50.0000 mg | Freq: Once | INTRAMUSCULAR | Status: DC | PRN

## 2020-04-21 MED ORDER — EPINEPHRINE 0.3 MG/0.3ML IJ SOAJ: 0.3000 mg | Freq: Once | INTRAMUSCULAR | Status: DC | PRN

## 2020-04-21 MED ORDER — SODIUM CHLORIDE 0.9 % IV SOLN
1200.0000 mg | Freq: Once | INTRAVENOUS | Status: AC
  Administered 2020-04-21: 1200 mg via INTRAVENOUS

## 2020-04-21 MED ORDER — ALBUTEROL SULFATE HFA 108 (90 BASE) MCG/ACT IN AERS: 2.0000 | INHALATION_SPRAY | Freq: Once | RESPIRATORY_TRACT | Status: DC | PRN

## 2020-04-21 MED ORDER — FAMOTIDINE IN NACL 20-0.9 MG/50ML-% IV SOLN: 20.0000 mg | Freq: Once | INTRAVENOUS | Status: DC | PRN

## 2020-04-21 MED ORDER — SODIUM CHLORIDE 0.9 % IV SOLN: INTRAVENOUS | Status: DC | PRN

## 2020-04-21 MED ORDER — METHYLPREDNISOLONE SODIUM SUCC 125 MG IJ SOLR: 125.0000 mg | Freq: Once | INTRAMUSCULAR | Status: DC | PRN

## 2020-04-21 NOTE — Discharge Instructions (Signed)

## 2020-04-21 NOTE — Progress Notes (Signed)
  Diagnosis: COVID-19  Physician: Dr. Asencion Noble  Procedure: Covid Infusion Clinic Med: casirivimab\imdevimab infusion - Provided patient with casirivimab\imdevimab fact sheet for patients, parents and caregivers prior to infusion.  Complications: No immediate complications noted.  Discharge: Discharged home   Charles Beasley 04/21/2020

## 2020-04-28 ENCOUNTER — Other Ambulatory Visit: Payer: PRIVATE HEALTH INSURANCE

## 2020-04-28 DIAGNOSIS — Z20822 Contact with and (suspected) exposure to covid-19: Secondary | ICD-10-CM

## 2020-04-29 LAB — SARS-COV-2, NAA 2 DAY TAT

## 2020-04-29 LAB — NOVEL CORONAVIRUS, NAA: SARS-CoV-2, NAA: NOT DETECTED

## 2020-08-11 ENCOUNTER — Encounter: Payer: Self-pay | Admitting: Intensive Care

## 2020-08-12 ENCOUNTER — Other Ambulatory Visit: Payer: PRIVATE HEALTH INSURANCE

## 2020-08-12 DIAGNOSIS — Z20822 Contact with and (suspected) exposure to covid-19: Secondary | ICD-10-CM

## 2020-08-14 LAB — NOVEL CORONAVIRUS, NAA: SARS-CoV-2, NAA: NOT DETECTED

## 2020-08-14 LAB — SARS-COV-2, NAA 2 DAY TAT

## 2020-10-28 ENCOUNTER — Ambulatory Visit: Payer: Self-pay | Admitting: Urology

## 2020-11-03 ENCOUNTER — Ambulatory Visit (INDEPENDENT_AMBULATORY_CARE_PROVIDER_SITE_OTHER): Payer: PRIVATE HEALTH INSURANCE | Admitting: Urology

## 2020-11-03 ENCOUNTER — Other Ambulatory Visit: Payer: Self-pay

## 2020-11-03 ENCOUNTER — Encounter: Payer: Self-pay | Admitting: Urology

## 2020-11-03 VITALS — BP 126/86 | HR 98 | Ht 69.0 in | Wt 155.0 lb

## 2020-11-03 DIAGNOSIS — N200 Calculus of kidney: Secondary | ICD-10-CM

## 2020-11-03 DIAGNOSIS — R350 Frequency of micturition: Secondary | ICD-10-CM | POA: Diagnosis not present

## 2020-11-03 NOTE — Patient Instructions (Signed)
Avoid carbonated drinks and artificial sweeteners as well as caffeine as these can irritate urinary symptoms in the bladder.  Improving your diabetes control will also improve your urinary frequency.   Textbook of Natural Medicine (5th ed., pp. [phone]). [place], [place]: Elsevier.">  Dietary Guidelines to Help Prevent Kidney Stones Kidney stones are deposits of minerals and salts that form inside your kidneys. Your risk of developing kidney stones may be greater depending on your diet, your lifestyle, the medicines you take, and whether you have certain medical conditions. Most people can lower their chances of developing kidney stones by following the instructions below. Your dietitian may give you more specific instructions depending on your overall health and the type of kidney stones you tend to develop. What are tips for following this plan? Reading food labels  Choose foods with "no salt added" or "low-salt" labels. Limit your salt (sodium) intake to less than 1,500 mg a day.  Choose foods with calcium for each meal and snack. Try to eat about 300 mg of calcium at each meal. Foods that contain 200-500 mg of calcium a serving include: ? 8 oz (237 mL) of milk, calcium-fortifiednon-dairy milk, and calcium-fortifiedfruit juice. Calcium-fortified means that calcium has been added to these drinks. ? 8 oz (237 mL) of kefir, yogurt, and soy yogurt. ? 4 oz (114 g) of tofu. ? 1 oz (28 g) of cheese. ? 1 cup (150 g) of dried figs. ? 1 cup (91 g) of cooked broccoli. ? One 3 oz (85 g) can of sardines or mackerel. Most people need 1,000-1,500 mg of calcium a day. Talk to your dietitian about how much calcium is recommended for you.   Shopping  Buy plenty of fresh fruits and vegetables. Most people do not need to avoid fruits and vegetables, even if these foods contain nutrients that may contribute to kidney stones.  When shopping for convenience foods, choose: ? Whole pieces of fruit. ? Pre-made  salads with dressing on the side. ? Low-fat fruit and yogurt smoothies.  Avoid buying frozen meals or prepared deli foods. These can be high in sodium.  Look for foods with live cultures, such as yogurt and kefir.  Choose high-fiber grains, such as whole-wheat breads, oat bran, and wheat cereals. Cooking  Do not add salt to food when cooking. Place a salt shaker on the table and allow each person to add his or her own salt to taste.  Use vegetable protein, such as beans, textured vegetable protein (TVP), or tofu, instead of meat in pasta, casseroles, and soups. Meal planning  Eat less salt, if told by your dietitian. To do this: ? Avoid eating processed or pre-made food. ? Avoid eating fast food.  Eat less animal protein, including cheese, meat, poultry, or fish, if told by your dietitian. To do this: ? Limit the number of times you have meat, poultry, fish, or cheese each week. Eat a diet free of meat at least 2 days a week. ? Eat only one serving each day of meat, poultry, fish, or seafood. ? When you prepare animal protein, cut pieces into small portion sizes. For most meat and fish, one serving is about the size of the palm of your hand.  Eat at least five servings of fresh fruits and vegetables each day. To do this: ? Keep fruits and vegetables on hand for snacks. ? Eat one piece of fruit or a handful of berries with breakfast. ? Have a salad and fruit at lunch. ? Have  two kinds of vegetables at dinner.  Limit foods that are high in a substance called oxalate. These include: ? Spinach (cooked), rhubarb, beets, sweet potatoes, and Swiss chard. ? Peanuts. ? Potato chips, french fries, and baked potatoes with skin on. ? Nuts and nut products. ? Chocolate.  If you regularly take a diuretic medicine, make sure to eat at least 1 or 2 servings of fruits or vegetables that are high in potassium each day. These include: ? Avocado. ? Banana. ? Orange, prune, carrot, or tomato  juice. ? Baked potato. ? Cabbage. ? Beans and split peas. Lifestyle  Drink enough fluid to keep your urine pale yellow. This is the most important thing you can do. Spread your fluid intake throughout the day.  If you drink alcohol: ? Limit how much you use to:  0-1 drink a day for women who are not pregnant.  0-2 drinks a day for men. ? Be aware of how much alcohol is in your drink. In the [place] one drink equals one 12 oz bottle of beer (355 mL), one 5 oz glass of wine (148 mL), or one 1 oz glass of hard liquor (44 mL).  Lose weight if told by your health care provider. Work with your dietitian to find an eating plan and weight loss strategies that work best for you.   General information  Talk to your health care provider and dietitian about taking daily supplements. You may be told the following depending on your health and the cause of your kidney stones: ? Not to take supplements with vitamin C. ? To take a calcium supplement. ? To take a daily probiotic supplement. ? To take other supplements such as magnesium, fish oil, or vitamin B6.  Take over-the-counter and prescription medicines only as told by your health care provider. These include supplements. What foods should I limit? Limit your intake of the following foods, or eat them as told by your dietitian. Vegetables Spinach. Rhubarb. Beets. Canned vegetables. Charles Beasley. Olives. Baked potatoes with skin. Grains Wheat bran. Baked goods. Salted crackers. Cereals high in sugar. Meats and other proteins Nuts. Nut butters. Large portions of meat, poultry, or fish. Salted, precooked, or cured meats, such as sausages, meat loaves, and hot dogs. Dairy Cheese. Beverages Regular soft drinks. Regular vegetable juice. Seasonings and condiments Seasoning blends with salt. Salad dressings. Soy sauce. Ketchup. Barbecue sauce. Other foods Canned soups. Canned pasta sauce. Casseroles. Pizza. Lasagna. Frozen meals. Potato chips. [place]  fries. The items listed above may not be a complete list of foods and beverages you should limit. Contact a dietitian for more information. What foods should I avoid? Talk to your dietitian about specific foods you should avoid based on the type of kidney stones you have and your overall health. Fruits Grapefruit. The item listed above may not be a complete list of foods and beverages you should avoid. Contact a dietitian for more information. Summary  Kidney stones are deposits of minerals and salts that form inside your kidneys.  You can lower your risk of kidney stones by making changes to your diet.  The most important thing you can do is drink enough fluid. Drink enough fluid to keep your urine pale yellow.  Talk to your dietitian about how much calcium you should have each day, and eat less salt and animal protein as told by your dietitian. This information is not intended to replace advice given to you by your health care provider. Make sure you discuss any questions  you have with your health care provider. Document Revised: 07/10/2019 Document Reviewed: 07/10/2019 Elsevier Patient Education  2021 [employer].

## 2020-11-03 NOTE — Progress Notes (Signed)
   11/03/2020 10:46 AM   Charles Beasley 30-Nov-1975 [id number]  Reason for visit: Follow up nephrolithiasis, history of atypical cytology, urinary symptoms  HPI: I saw [name] for yearly follow-up.  To briefly summarize his history, he is a 45 year old male with history of multiple ER visits for abdominal pain, as well as history of nephrolithiasis. He has been seen by urology at both [facility] and [place]. He underwent left ureteroscopy and laser lithotripsy with stent placement at [facility] for a 5 mm left ureteral stone on 05/08/2019, and stent was not removed until 08/04/2019. There was some erythema at time of stent removal. He also had a CT with some bladder wall thickening. He underwent a cystoscopy with Dr. Thurmond Butts at [facility] on 08/14/2019 which was essentially normal aside from some very mild erythema at the trigone. Cytology was sent and was atypical. 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 Cytology sent from that bladder wash was negative.    He was admitted on 11/19/2019 to [facility] ED with a 3 mm right ureteral stone that ultimately passed spontaneously.  He has not tolerated stents well in the past.  Urinalysis today completely benign with 0-5 WBCs, 0-2 RBCs, no bacteria, nitrite negative, no leukocytes.  I personally viewed and interpreted his CT from 03/29/2020 that shows no hydronephrosis or ureteral stones, small 3 mm left lower pole nonobstructive stone, normal-appearing bladder.  He has poorly controlled diabetes with most recent hemoglobin A1c of 9.5 which is only mildly improved from 10.1 in January 2021.  His primary complaint today is urinary frequency.  He drinks diet soda during the day.  We discussed the relationship between poorly  controlled diabetes and urinary symptoms, as well as artificial sweeteners and carbonated beverages and their effect on overactive bladder symptoms.  Behavioral strategies discussed at length.  We discussed general stone prevention strategies including adequate hydration with goal of producing 2.5 L of urine daily, increasing citric acid intake, increasing calcium intake during high oxalate meals, minimizing animal protein, and decreasing salt intake. Information about dietary recommendations given today.   RTC 1 year for UA, IPSS, PVR   Billey Co, MD  [facility] [address], [place], [place] [address] [phone]

## 2020-11-04 LAB — URINALYSIS, COMPLETE
Bilirubin, UA: NEGATIVE
Ketones, UA: NEGATIVE
Leukocytes,UA: NEGATIVE
Nitrite, UA: NEGATIVE
Protein,UA: NEGATIVE
RBC, UA: NEGATIVE
Specific Gravity, UA: 1.015 (ref 1.005–1.030)
Urobilinogen, Ur: 0.2 mg/dL (ref 0.2–1.0)
pH, UA: 5.5 (ref 5.0–7.5)

## 2020-11-04 LAB — MICROSCOPIC EXAMINATION
Bacteria, UA: NONE SEEN
Epithelial Cells (non renal): NONE SEEN /hpf (ref 0–10)

## 2021-03-09 IMAGING — MR MR ABDOMEN WO/W CM
21 series · 48 of 48 positions shown · IV contrast (7 GADAVIST)
Comparison: CT evaluation from 01/12/2020 and 11/18/2019

CLINICAL DATA: RIGHT lower quadrant abdominal pain, continued
abdominal pain

EXAM:
MRI ABDOMEN AND PELVIS WITHOUT AND WITH CONTRAST
TECHNIQUE: Multiplanar multisequence MR imaging of the abdomen and pelvis was
performed both before and after the administration of intravenous
contrast.
CONTRAST:  7mL GADAVIST GADOBUTROL 1 MMOL/ML IV SOLN

[Series 4: T2 · coronal · 6.0mm · 1.19mm/px · 1 of 33 slices shown (1 of 5)]
[im 1/33]
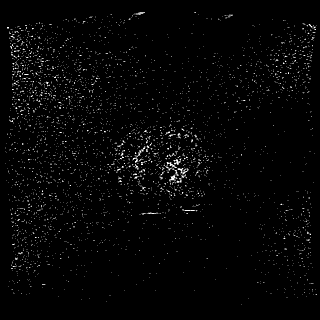

[Series 5: T2 · axial · 6.0mm · 1.19mm/px · 1 of 34 slices shown (2 of 5)]
[im 1/34]
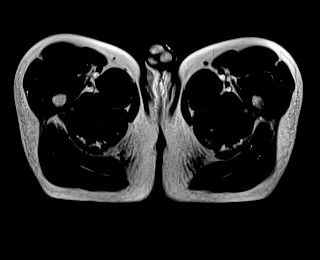

[Series 6: T2 · sagittal · 6.0mm · 1.19mm/px · 1 of 44 slices shown (3 of 5)]
[im 1/44]
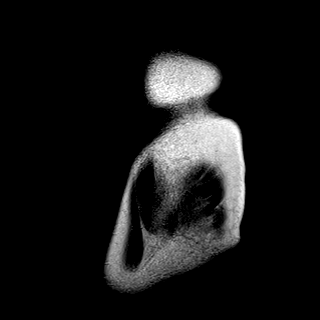

[Series 7: T1 · axial · 3.0mm · 1.19mm/px · z∈[-204,+9]mm · 3 of 72 slices shown (1 of 2)]
[im 1/72]
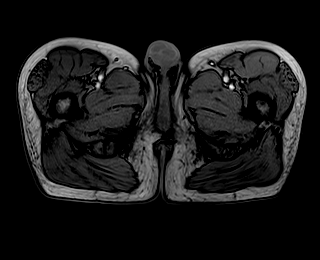
[im 36/72]
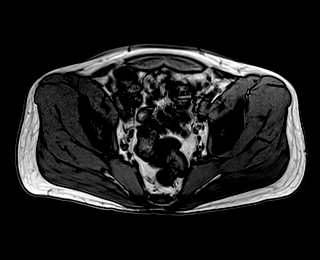
[im 72/72]
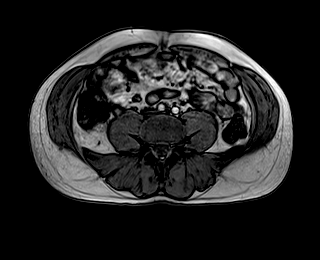

[Series 9: T2 · coronal · 6.0mm · 1.25mm/px · 1 of 35 slices shown (4 of 5)]
[im 1/35]
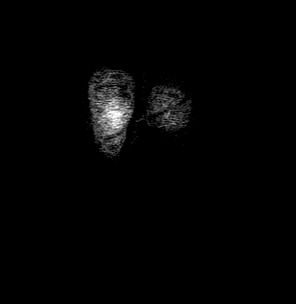

[Series 10: T1 fat-sat · axial · 3.0mm · 1.19mm/px · z∈[-207,+6]mm · 3 of 72 slices shown (1 of 2)]
[im 1/72]
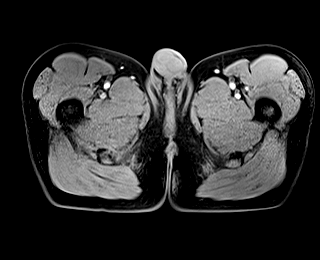
[im 36/72]
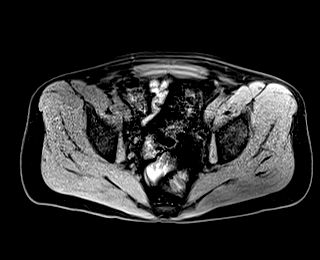
[im 72/72]
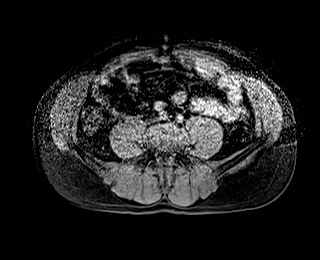

[Series 11: bSSFP · axial · 6.0mm · 0.74mm/px · 1 of 33 slices shown]
[im 1/33]
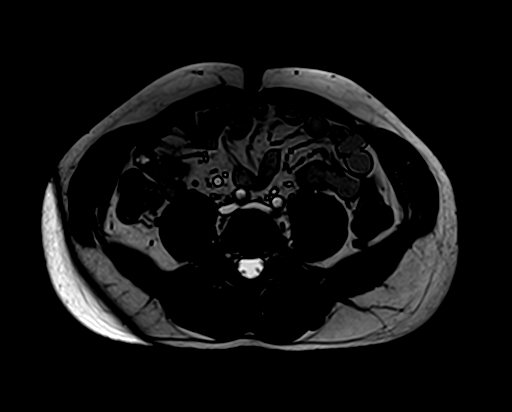

[Series 13: T2 fat-sat · axial · 6.0mm · 1.19mm/px · 1 of 35 slices shown]
[im 1/35]
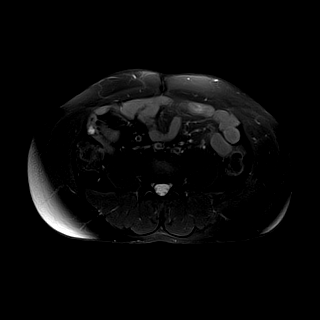

[Series 14: T1 · axial · 6.0mm · 0.74mm/px · z∈[+7,+216]mm · 2 of 60 slices shown (2 of 2)]
[im 1/60]
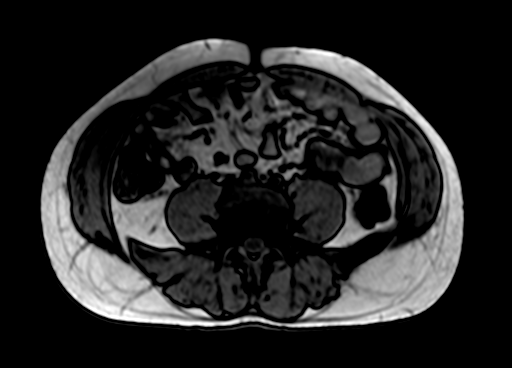
[im 60/60]
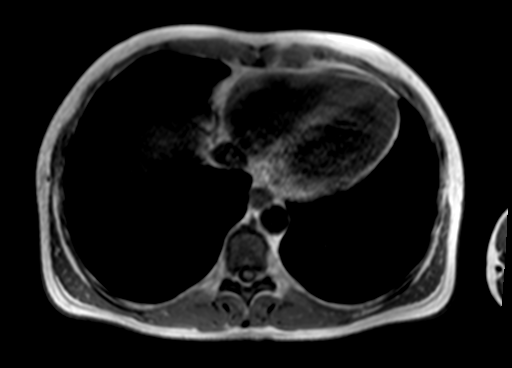

[Series 15: T2 · axial · 6.0mm · 1.19mm/px · 1 of 32 slices shown (5 of 5)]
[im 1/32]
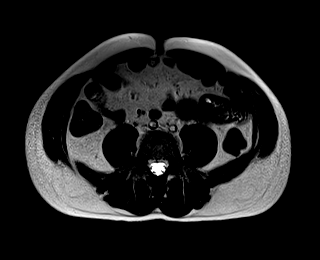

[Series 16: T1 dynamic fat-sat · axial · non-contrast · 3.0mm · 1.19mm/px · z∈[-7,+230]mm · 3 of 80 slices shown]
[im 1/80]
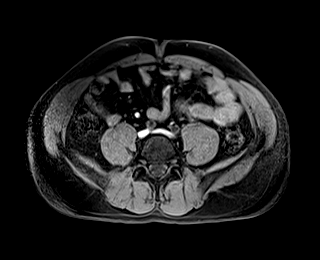
[im 40/80]
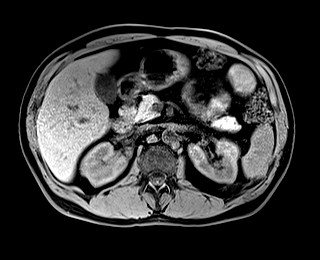
[im 80/80]
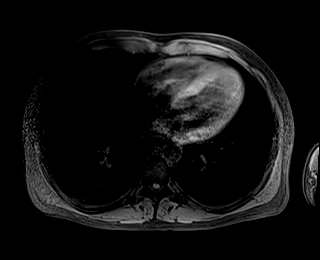

[Series 17: T1 dynamic fat-sat post-contrast · axial · 3.0mm · 1.19mm/px · z∈[-7,+230]mm · 3 of 80 slices shown (1 of 8)]
[im 1/80]
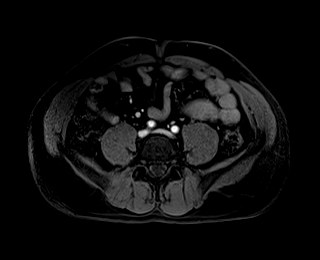
[im 40/80]
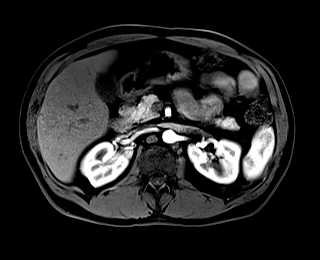
[im 80/80]
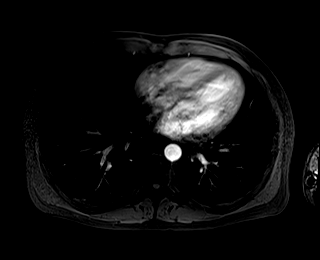

[Series 18: T1 dynamic fat-sat post-contrast · axial · 3.0mm · 1.19mm/px · z∈[-7,+230]mm · 3 of 80 slices shown (2 of 8)]
[im 1/80]
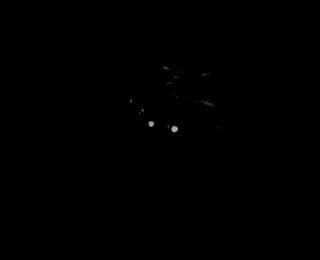
[im 40/80]
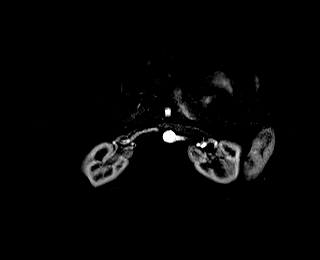
[im 80/80]
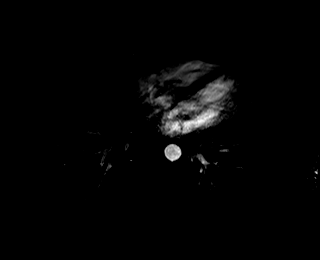

[Series 19: T1 dynamic fat-sat post-contrast · axial · 3.0mm · 1.19mm/px · z∈[-7,+230]mm · 3 of 80 slices shown (3 of 8)]
[im 1/80]
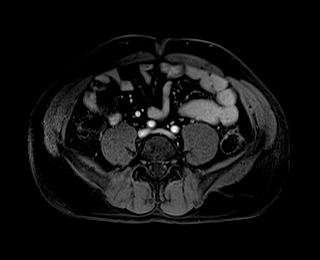
[im 40/80]
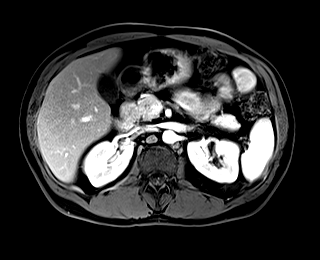
[im 80/80]
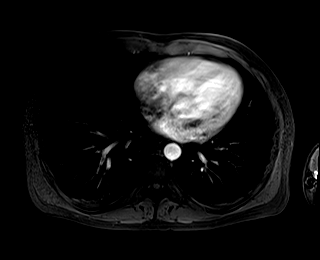

[Series 20: T1 dynamic fat-sat post-contrast · axial · 3.0mm · 1.19mm/px · z∈[-7,+230]mm · 3 of 80 slices shown (4 of 8)]
[im 1/80]
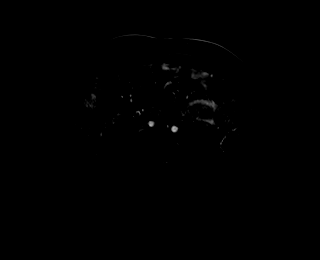
[im 40/80]
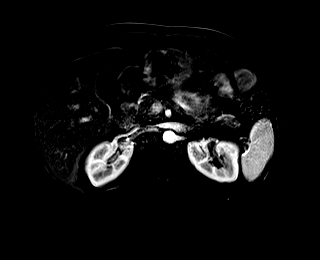
[im 80/80]
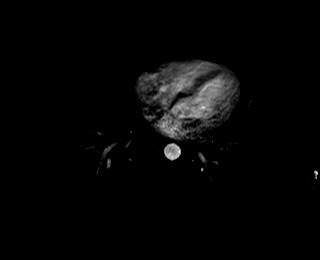

[Series 21: T1 dynamic fat-sat post-contrast · axial · 3.0mm · 1.19mm/px · z∈[-7,+230]mm · 3 of 80 slices shown (5 of 8)]
[im 1/80]
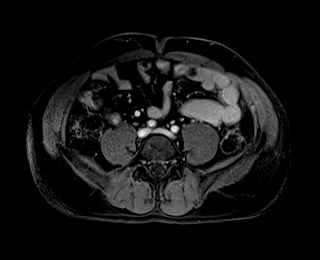
[im 40/80]
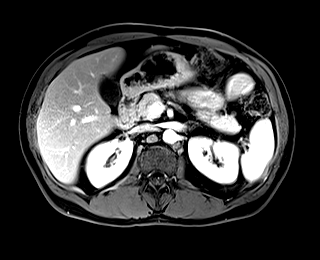
[im 80/80]
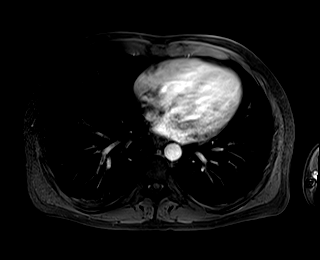

[Series 22: T1 dynamic fat-sat post-contrast · axial · 3.0mm · 1.19mm/px · z∈[-7,+230]mm · 3 of 80 slices shown (6 of 8)]
[im 1/80]
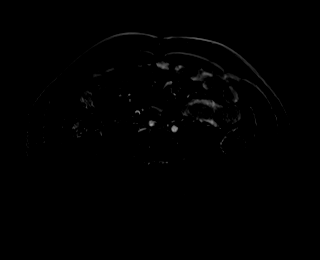
[im 40/80]
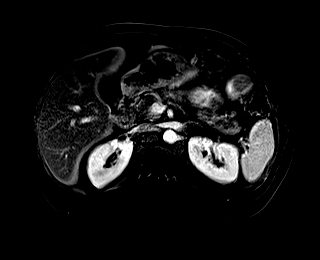
[im 80/80]
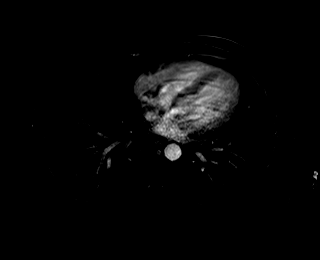

[Series 23: T1 dynamic post-contrast · coronal · 3.0mm · 1.31mm/px · 3 of 72 slices shown]
[im 1/72]
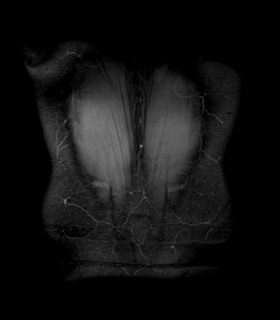
[im 36/72]
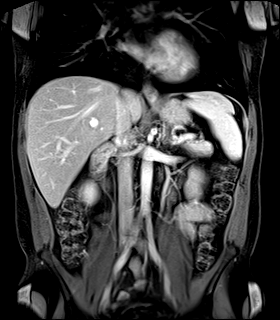
[im 72/72]
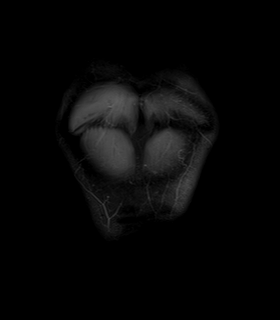

[Series 24: T1 dynamic fat-sat post-contrast · axial · 3.0mm · 1.19mm/px · z∈[-7,+230]mm · 3 of 80 slices shown (7 of 8)]
[im 1/80]
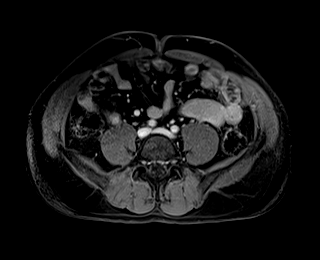
[im 40/80]
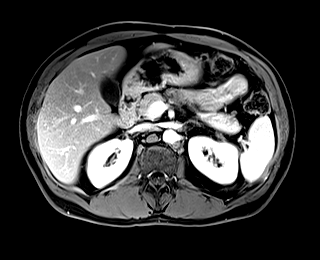
[im 80/80]
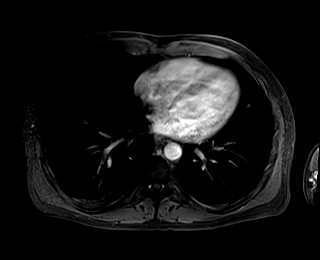

[Series 25: T1 dynamic fat-sat post-contrast · axial · 3.0mm · 1.19mm/px · z∈[-7,+230]mm · 3 of 80 slices shown (8 of 8)]
[im 1/80]
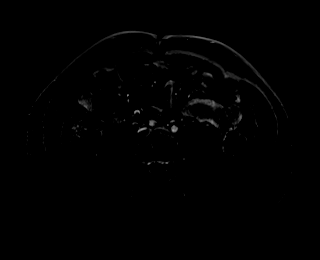
[im 40/80]
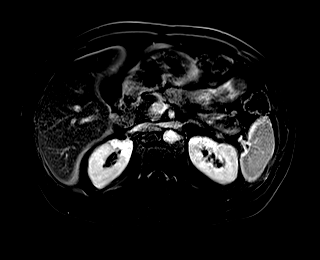
[im 80/80]
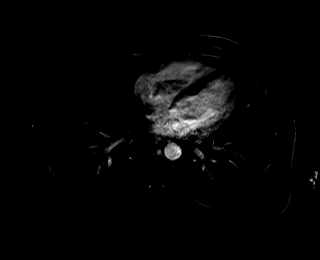

[Series 26: T1 fat-sat · axial · 3.0mm · 1.19mm/px · z∈[-207,+6]mm · 3 of 72 slices shown (2 of 2)]
[im 1/72]
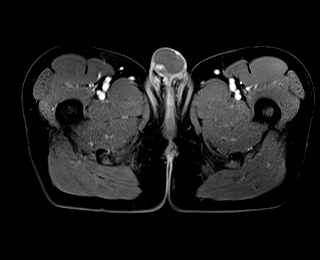
[im 36/72]
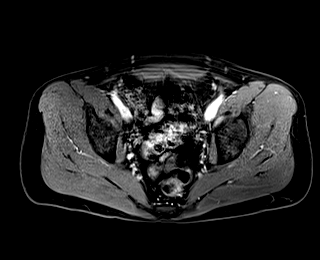
[im 72/72]
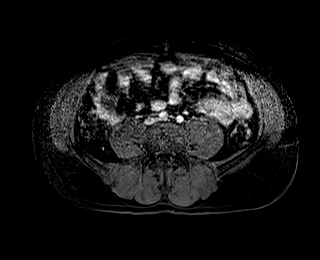

[48 of 48 positions shown; findings below may reference images not displayed]

FINDINGS: COMBINED FINDINGS FOR BOTH MR ABDOMEN AND PELVIS

Lower chest: Incidental imaging of the lung bases without
consolidation or signs of pleural effusion. No pericardial fluid.

Hepatobiliary: Cyst in the RIGHT hepatic lobe. Mild hepatic
steatosis. No pericholecystic stranding. No biliary ductal dilation.

Pancreas:  Normal appearance of the pancreas.

Spleen:  Spleen normal size without focal lesion.

Adrenals/Urinary Tract:  Adrenal glands are normal.

Smooth renal contours with symmetric enhancement. Small calculus
seen on the previous exam is not seen on today's MRI. This is
present in the LEFT kidney on the previous CT.

Stomach/Bowel: Gastrointestinal tract is unremarkable to the extent
evaluated on MRI, not protocol for bowel evaluation. No perienteric
or pericolonic stranding. No sign of bowel obstruction. Normal
appendix.

Vascular/Lymphatic: Vascular structures in the abdomen are patent.
No retroperitoneal adenopathy. No pelvic adenopathy.

Reproductive: Prostate with heterogeneous signal on T2, nonspecific.
Correlate with any clinical evidence of prostatitis.

Other:  No ascites.

Musculoskeletal: No suspicious bone lesions identified.
IMPRESSION: 1. No evidence of bowel obstruction or other acute findings within
the abdomen or pelvis. The appendix is normal.
2. No biliary duct dilation or pericholecystic stranding.
3. Mild hepatic steatosis.
4. Nonspecific mild heterogeneity of the prostate not well
evaluated, correlate with any clinical signs of prostatitis.
5. Study not adequate for renal or ureteral calculus evaluation
though there is no hydronephrosis or perinephric stranding.

## 2021-05-25 ENCOUNTER — Emergency Department: Payer: Medicaid Other

## 2021-05-25 ENCOUNTER — Other Ambulatory Visit: Payer: Self-pay

## 2021-05-25 ENCOUNTER — Encounter: Payer: Self-pay | Admitting: Emergency Medicine

## 2021-05-25 ENCOUNTER — Emergency Department
Admission: EM | Admit: 2021-05-25 | Discharge: 2021-05-25 | Disposition: A | Discharge status: Home / Self Care | Payer: Medicaid Other | Attending: Emergency Medicine | Admitting: Emergency Medicine

## 2021-05-25 DIAGNOSIS — Z7984 Long term (current) use of oral hypoglycemic drugs: Secondary | ICD-10-CM | POA: Diagnosis not present

## 2021-05-25 DIAGNOSIS — N341 Nonspecific urethritis: Secondary | ICD-10-CM | POA: Insufficient documentation

## 2021-05-25 DIAGNOSIS — E1165 Type 2 diabetes mellitus with hyperglycemia: Secondary | ICD-10-CM | POA: Diagnosis not present

## 2021-05-25 DIAGNOSIS — K760 Fatty (change of) liver, not elsewhere classified: Secondary | ICD-10-CM | POA: Diagnosis not present

## 2021-05-25 DIAGNOSIS — Z79899 Other long term (current) drug therapy: Secondary | ICD-10-CM | POA: Insufficient documentation

## 2021-05-25 DIAGNOSIS — N342 Other urethritis: Secondary | ICD-10-CM

## 2021-05-25 DIAGNOSIS — K219 Gastro-esophageal reflux disease without esophagitis: Secondary | ICD-10-CM | POA: Insufficient documentation

## 2021-05-25 DIAGNOSIS — R079 Chest pain, unspecified: Secondary | ICD-10-CM

## 2021-05-25 DIAGNOSIS — I700 Atherosclerosis of aorta: Secondary | ICD-10-CM | POA: Diagnosis not present

## 2021-05-25 DIAGNOSIS — J45909 Unspecified asthma, uncomplicated: Secondary | ICD-10-CM | POA: Diagnosis not present

## 2021-05-25 DIAGNOSIS — R197 Diarrhea, unspecified: Secondary | ICD-10-CM | POA: Insufficient documentation

## 2021-05-25 DIAGNOSIS — Z8551 Personal history of malignant neoplasm of bladder: Secondary | ICD-10-CM | POA: Diagnosis not present

## 2021-05-25 DIAGNOSIS — Z87891 Personal history of nicotine dependence: Secondary | ICD-10-CM | POA: Insufficient documentation

## 2021-05-25 DIAGNOSIS — R1031 Right lower quadrant pain: Secondary | ICD-10-CM | POA: Diagnosis present

## 2021-05-25 DIAGNOSIS — Z20822 Contact with and (suspected) exposure to covid-19: Secondary | ICD-10-CM | POA: Diagnosis not present

## 2021-05-25 LAB — URINALYSIS, ROUTINE W REFLEX MICROSCOPIC
Bilirubin Urine: NEGATIVE
Glucose, UA: 50 mg/dL — AB
Ketones, ur: 5 mg/dL — AB
Leukocytes,Ua: NEGATIVE
Nitrite: NEGATIVE
Protein, ur: 30 mg/dL — AB
Specific Gravity, Urine: 1.024 (ref 1.005–1.030)
Squamous Epithelial / HPF: NONE SEEN (ref 0–5)
pH: 5 (ref 5.0–8.0)

## 2021-05-25 LAB — COMPREHENSIVE METABOLIC PANEL
ALT: 16 U/L (ref 0–44)
AST: 18 U/L (ref 15–41)
Albumin: 3.9 g/dL (ref 3.5–5.0)
Alkaline Phosphatase: 61 U/L (ref 38–126)
Anion gap: 6 (ref 5–15)
BUN: 14 mg/dL (ref 6–20)
CO2: 24 mmol/L (ref 22–32)
Calcium: 9 mg/dL (ref 8.9–10.3)
Chloride: 106 mmol/L (ref 98–111)
Creatinine, Ser: 0.82 mg/dL (ref 0.61–1.24)
GFR, Estimated: >60 mL/min (ref >60)
Glucose, Bld: 201 mg/dL — ABNORMAL HIGH (ref 70–99)
Potassium: 4 mmol/L (ref 3.5–5.1)
Sodium: 136 mmol/L (ref 135–145)
Total Bilirubin: 0.4 mg/dL (ref 0.3–1.2)
Total Protein: 7.7 g/dL (ref 6.5–8.1)

## 2021-05-25 LAB — CBC
HCT: 37.5 % — ABNORMAL LOW (ref 39.0–52.0)
Hemoglobin: 13.1 g/dL (ref 13.0–17.0)
MCH: 31.9 pg (ref 26.0–34.0)
MCHC: 34.9 g/dL (ref 30.0–36.0)
MCV: 91.2 fL (ref 80.0–100.0)
Platelets: 435 10*3/uL — ABNORMAL HIGH (ref 150–400)
RBC: 4.11 MIL/uL — ABNORMAL LOW (ref 4.22–5.81)
RDW: 13.2 % (ref 11.5–15.5)
WBC: 9.8 10*3/uL (ref 4.0–10.5)
nRBC: 0 % (ref 0.0–0.2)

## 2021-05-25 LAB — RESP PANEL BY RT-PCR (FLU A&B, COVID) ARPGX2
Influenza A by PCR: NEGATIVE
Influenza B by PCR: NEGATIVE
SARS Coronavirus 2 by RT PCR: NEGATIVE

## 2021-05-25 LAB — CHLAMYDIA/NGC RT PCR (ARMC ONLY)
Chlamydia Tr: NOT DETECTED
N gonorrhoeae: NOT DETECTED

## 2021-05-25 LAB — TROPONIN I (HIGH SENSITIVITY): Troponin I (High Sensitivity): 2 ng/L (ref <18)

## 2021-05-25 LAB — LIPASE, BLOOD: Lipase: 36 U/L (ref 11–51)

## 2021-05-25 MED ORDER — KETOROLAC TROMETHAMINE 30 MG/ML IJ SOLN
15.0000 mg | Freq: Once | INTRAMUSCULAR | Status: AC
  Administered 2021-05-25: 15 mg via INTRAVENOUS
  Filled 2021-05-25: qty 1

## 2021-05-25 MED ORDER — ONDANSETRON HCL 4 MG/2ML IJ SOLN
4.0000 mg | Freq: Once | INTRAMUSCULAR | Status: AC
  Administered 2021-05-25: 4 mg via INTRAVENOUS
  Filled 2021-05-25: qty 2

## 2021-05-25 MED ORDER — MORPHINE SULFATE (PF) 4 MG/ML IV SOLN
6.0000 mg | Freq: Once | INTRAVENOUS | Status: AC
  Administered 2021-05-25: 6 mg via INTRAVENOUS
  Filled 2021-05-25: qty 2

## 2021-05-25 MED ORDER — IOHEXOL 300 MG/ML  SOLN
100.0000 mL | Freq: Once | INTRAMUSCULAR | Status: AC | PRN
  Administered 2021-05-25: 100 mL via INTRAVENOUS
  Filled 2021-05-25: qty 100

## 2021-05-25 MED ORDER — SODIUM CHLORIDE 0.9 % IV SOLN
1.0000 g | Freq: Once | INTRAVENOUS | Status: AC
  Administered 2021-05-25: 1 g via INTRAVENOUS
  Filled 2021-05-25: qty 10

## 2021-05-25 MED ORDER — ONDANSETRON HCL 4 MG PO TABS: 4.0000 mg | ORAL_TABLET | Freq: Three times a day (TID) | ORAL | 0 refills | Status: DC | PRN

## 2021-05-25 MED ORDER — DICYCLOMINE HCL 10 MG PO CAPS
10.0000 mg | ORAL_CAPSULE | Freq: Once | ORAL | Status: AC
  Administered 2021-05-25: 10 mg via ORAL
  Filled 2021-05-25: qty 1

## 2021-05-25 MED ORDER — LACTATED RINGERS IV BOLUS
1000.0000 mL | Freq: Once | INTRAVENOUS | Status: AC
  Administered 2021-05-25: 1000 mL via INTRAVENOUS

## 2021-05-25 MED ORDER — DOXYCYCLINE HYCLATE 100 MG PO CAPS: 100.0000 mg | ORAL_CAPSULE | Freq: Two times a day (BID) | ORAL | 0 refills | Status: AC

## 2021-05-25 NOTE — ED Provider Notes (Signed)
[facility] Emergency Department Provider Note  ____________________________________________   Event Date/Time   First MD Initiated Contact with Patient 05/25/21 1527     (approximate)  I have reviewed the triage vital signs and the nursing notes.   HISTORY  Chief Complaint Abdominal Pain   HPI Charles Beasley is a 45 y.o. male with past medical history of asthma, bladder cancer, DM, diverticulosis, GERD, IBS and kidney stones previously requiring stenting who presents for assessment of some acute onset of nausea associated with some right-sided abdominal pain as well as left-sided abdominal pain and some nonbloody diarrhea that started yesterday.  He states it feels like it radiates to the right side of his back.  He denies any fevers or shortness of breath that states he has also had a mild nonproductive cough and a little bit of substernal discomfort as well.  No earache, sore throat, vomiting, blood in his stool, blood in his urine but he states he has a little bit of burning with urination.  No rash or extremity pain.  No focal weakness numbness or tingling.  States he has been taking Tylenol ibuprofen without much improvement in his pain.  Denies any significant recent EtOH use or significant NSAID use otherwise.  No other acute concerns at this time.         Past Medical History:  Diagnosis Date   Asthma    Bladder cancer ([place])    Colitis    Diabetes mellitus without complication (HCC)    Diverticulosis    GERD (gastroesophageal reflux disease)    History of kidney stones    IBS (irritable bowel syndrome)     Patient Active Problem List   Diagnosis Date Noted   Neutrophilia 02/10/2020   Generalized abdominal pain    Encounter for screening colonoscopy    Polyp of colon    Ureterolithiasis 11/19/2019   Intractable pain 14/78/2956   Renal colic on right side 21/30/8657   Ureteral calculus, right 11/18/2019   History of colitis 11/18/2019    UTI (urinary tract infection) 11/18/2019   Abnormal urine cytology 09/30/2019   Colitis 09/06/2019   Diabetes mellitus without complication ([place])    Asthma    Dyshidrotic hand dermatitis 08/29/2018   Aortic atherosclerosis ([place]) 03/09/2017   Calculus of kidney 11/28/2016   Diet-controlled type 2 diabetes mellitus ([place]) 06/21/2016   Tinnitus 03/12/2014   Carpal tunnel syndrome of right wrist 03/12/2014   Depression 02/11/2014   Allergic rhinitis 04/21/2013   Gastroesophageal reflux disease without esophagitis 02/21/2009   IBS (irritable bowel syndrome) 07/31/1989    Past Surgical History:  Procedure Laterality Date   COLONOSCOPY WITH PROPOFOL N/A 01/22/2020   Procedure: COLONOSCOPY WITH PROPOFOL;  Surgeon: Virgel Manifold, MD;  Location: [facility] ENDOSCOPY;  Service: Endoscopy;  Laterality: N/A;   ESOPHAGOGASTRODUODENOSCOPY (EGD) WITH PROPOFOL N/A 01/22/2020   Procedure: ESOPHAGOGASTRODUODENOSCOPY (EGD) WITH PROPOFOL;  Surgeon: Virgel Manifold, MD;  Location: [facility] ENDOSCOPY;  Service: Endoscopy;  Laterality: N/A;    Prior to Admission medications   Medication Sig Start Date End Date Taking? Authorizing Provider  doxycycline (VIBRAMYCIN) 100 MG capsule Take 1 capsule (100 mg total) by mouth 2 (two) times daily for 14 days. 05/25/21 06/08/21 Yes Lucrezia Starch, MD  ondansetron (ZOFRAN) 4 MG tablet Take 1 tablet (4 mg total) by mouth every 8 (eight) hours as needed for up to 10 doses for nausea or vomiting. 05/25/21  Yes Lucrezia Starch, MD  ACCU-CHEK GUIDE test strip  2 (two) times daily. for testing as directed 11/05/19   [provider]  Accu-Chek Softclix Lancets lancets 1 each 3 (three) times daily. 11/01/20   [provider]  busPIRone (BUSPAR) 7.5 MG tablet Take 7.5 mg by mouth 2 (two) times daily. 08/29/19   [provider]  citalopram (CELEXA) 20 MG tablet Take 20 mg by mouth daily. 11/05/19   [provider]  escitalopram (LEXAPRO) 10 MG tablet  Take 1 tablet by mouth daily. 10/14/20   [provider]  glipiZIDE (GLUCOTROL XL) 5 MG 24 hr tablet Take 5 mg by mouth daily. 09/02/19   [provider]  metFORMIN (GLUCOPHAGE) 1000 MG tablet Take 1,000 mg by mouth 2 (two) times daily. 08/19/19   [provider]  oxybutynin (DITROPAN-XL) 10 MG 24 hr tablet Take 10 mg by mouth daily. 02/09/20   [provider]  simvastatin (ZOCOR) 20 MG tablet Take 20 mg by mouth at bedtime. 08/16/19   [provider]    Allergies Montelukast and Sertraline  Family History  Problem Relation Age of Onset   Breast cancer Mother    Hypertension Father    Prostate cancer Neg Hx    Bladder Cancer Neg Hx    Kidney cancer Neg Hx     Social History Social History   Tobacco Use   Smoking status: Former    Types: Cigarettes   Smokeless tobacco: Never  Vaping Use   Vaping Use: Every day  Substance Use Topics   Alcohol use: No    Alcohol/week: 0.0 standard drinks   Drug use: No    Review of Systems  Review of Systems  Constitutional:  Negative for chills and fever.  HENT:  Negative for sore throat.   Eyes:  Negative for pain.  Respiratory:  Negative for cough and stridor.   Cardiovascular:  Positive for chest pain.  Gastrointestinal:  Positive for abdominal pain, diarrhea and nausea. Negative for vomiting.  Genitourinary:  Negative for dysuria.  Musculoskeletal:  Negative for myalgias.  Skin:  Negative for rash.  Neurological:  Positive for headaches. Negative for seizures and loss of consciousness.  Psychiatric/Behavioral:  Negative for suicidal ideas.   All other systems reviewed and are negative.    ____________________________________________   PHYSICAL EXAM:  VITAL SIGNS: ED Triage Vitals  Enc Vitals Group     BP 05/25/21 1025 134/83     Pulse Rate 05/25/21 1025 91     Resp 05/25/21 1025 14     Temp 05/25/21 1025 98 F (36.7 C)     Temp Source 05/25/21 1025 Oral     SpO2 05/25/21 1025 95  %     Weight 05/25/21 1021 154 lb 15.7 oz (70.3 kg)     Height 05/25/21 1021 5\' 9"  (1.753 m)     Head Circumference --      Peak Flow --      Pain Score 05/25/21 1021 8     Pain Loc --      Pain Edu? --      Excl. in [place]? --    Vitals:   05/25/21 1025 05/25/21 1456  BP: 134/83 122/82  Pulse: 91 88  Resp: 14 16  Temp: 98 F (36.7 C) 97.8 F (36.6 C)  SpO2: 95% 98%   Physical Exam Vitals and nursing note reviewed.  Constitutional:      Appearance: He is well-developed.  HENT:     Head: Normocephalic and atraumatic.     Right Ear:  External ear normal.     Left Ear: External ear normal.     Nose: Nose normal.  Eyes:     Conjunctiva/sclera: Conjunctivae normal.  Cardiovascular:     Rate and Rhythm: Normal rate and regular rhythm.     Heart sounds: No murmur heard. Pulmonary:     Effort: Pulmonary effort is normal. No respiratory distress.     Breath sounds: Normal breath sounds.  Abdominal:     Palpations: Abdomen is soft.     Tenderness: There is no abdominal tenderness.  Musculoskeletal:     Cervical back: Neck supple.  Skin:    General: Skin is warm and dry.     Capillary Refill: Capillary refill takes less than 2 seconds.  Neurological:     Mental Status: He is alert and oriented to person, place, and time.  Psychiatric:        Mood and Affect: Mood normal.     ____________________________________________   LABS (all labs ordered are listed, but only abnormal results are displayed)  Labs Reviewed  COMPREHENSIVE METABOLIC PANEL - Abnormal; Notable for the following components:      Result Value   Glucose, Bld 201 (*)    All other components within normal limits  CBC - Abnormal; Notable for the following components:   RBC 4.11 (*)    HCT 37.5 (*)    Platelets 435 (*)    All other components within normal limits  URINALYSIS, ROUTINE W REFLEX MICROSCOPIC - Abnormal; Notable for the following components:   Color, Urine YELLOW (*)    APPearance CLEAR (*)     Glucose, UA 50 (*)    Hgb urine dipstick SMALL (*)    Ketones, ur 5 (*)    Protein, ur 30 (*)    Bacteria, UA RARE (*)    All other components within normal limits  RESP PANEL BY RT-PCR (FLU A&B, COVID) [id number]  URINE CULTURE  CHLAMYDIA/NGC RT PCR ([facility] ONLY)            LIPASE, BLOOD  TROPONIN I (HIGH SENSITIVITY)   ____________________________________________  EKG  ECG remarkable for sinus rhythm with ventricular rate of 81, normal axis, unremarkable intervals without clear evidence of acute ischemia or significant arrhythmia. ____________________________________________  RADIOLOGY  ED MD interpretation: Chest x-ray has no focal consolidation, effusion, edema, pneumothorax or any other acute intrathoracic process.   CT abdomen pelvis shows nonobstructing left-sided kidney stone without any right-sided stones or ureteral stones.  There is no perinephric stranding, evidence of diverticulitis, appendicitis, pancreatitis, cholecystitis SBO or other acute abdominal pelvic process.  There is some hepatic steatosis and aortic atherosclerosis.  Official radiology report(s): DG Chest 2 View  Result Date: 05/25/2021 CLINICAL DATA:  cough/CP EXAM: CHEST - 2 VIEW COMPARISON:  None. FINDINGS: The cardiomediastinal silhouette is within normal limits. No pleural effusion. No pneumothorax. No mass or consolidation. No acute osseous abnormality. IMPRESSION: No acute findings in the chest. Electronically Signed   By: Albin Felling M.D.   On: 05/25/2021 16:10   CT ABDOMEN PELVIS W CONTRAST  Result Date: 05/25/2021 CLINICAL DATA:  Nonlocalized acute abdominal pain. lower abdominal pain and right lower back pain "for several years". States has irritable bowl, diverticulosis, kidney stones EXAM: CT ABDOMEN AND PELVIS WITH CONTRAST TECHNIQUE: Multidetector CT imaging of the abdomen and pelvis was performed using the standard protocol following bolus administration of intravenous contrast. CONTRAST:   183mL OMNIPAQUE IOHEXOL 300 MG/ML  SOLN COMPARISON:  None. FINDINGS: Lower chest: No acute  abnormality. Hepatobiliary: Subcentimeter hypodensity too small to characterize. The hepatic parenchyma is diffusely hypodense compared to the splenic parenchyma consistent with fatty infiltration. No focal liver abnormality. No gallstones, gallbladder wall thickening, or pericholecystic fluid. No biliary dilatation. Pancreas: No focal lesion. Normal pancreatic contour. No surrounding inflammatory changes. No main pancreatic ductal dilatation. Spleen: Normal in size without focal abnormality.  There is Adrenals/Urinary Tract: No adrenal nodule bilaterally. Bilateral kidneys enhance symmetrically. Subcentimeter hypodensities too small to characterize. A 4 mm calcified stone within the left kidney. No hydronephrosis. No hydroureter. The urinary bladder is unremarkable. Stomach/Bowel: Stomach is within normal limits. No evidence of bowel wall thickening or dilatation. Diffuse sigmoid diverticulosis. Appendix appears normal. Vascular/Lymphatic: No abdominal aorta or iliac aneurysm. Moderate atherosclerotic plaque of the aorta and its branches. No abdominal, pelvic, or inguinal lymphadenopathy. Reproductive: Prostate is unremarkable. Other: No intraperitoneal free fluid. No intraperitoneal free gas. No organized fluid collection. Musculoskeletal: No abdominal wall hernia or abnormality. No suspicious lytic or blastic osseous lesions. No acute displaced fracture. IMPRESSION: 1. Nonobstructive left 4 mm nephrolithiasis. 2. Sigmoid diverticulosis with no acute diverticulitis. 3. Hepatic steatosis. 4.  Aortic Atherosclerosis (ICD10-I70.0). Electronically Signed   By: Iven Finn M.D.   On: 05/25/2021 16:32    ____________________________________________   PROCEDURES  Procedure(s) performed (including Critical Care):  Procedures   ____________________________________________   INITIAL IMPRESSION / ASSESSMENT AND PLAN  / ED COURSE      Patient presents with above-stated history exam for assessment of 2 days of some nausea associate with diarrhea and bilateral abdominal pain worse on the right rating to the back as well as some substernal chest pressure and mild nonproductive cough.  On arrival he is afebrile and hemodynamically stable.  He does have some right CVA tenderness and right lower quadrant abdominal pain.  He is not peritonitis or guarding.  With regard to his chest pressure it is certainly possible he has some reflux although different internal differential includes pneumonia, ACS, arrhythmia, anemia and metabolic derangements.  Low sufficient for ACS given nonelevated troponin obtained greater than 3 hours after symptom onset and otherwise reassuring EKG. COVID and flu is negative.  Chest x-ray is unremarkable overall I have low suspicion for pneumonia or spontaneous pneumothorax or other acute thoracic process at this time.  With regard to his abdominal discomfort and other GI symptoms also burning with urination differential includes cystitis, pyelonephritis, kidney stone, diverticulitis, IBS flare, and acute infectious gastroenteritis.   CT abdomen pelvis shows nonobstructing left-sided kidney stone without any right-sided stones or ureteral stones.  There is no perinephric stranding, evidence of diverticulitis, appendicitis, pancreatitis, cholecystitis SBO or other acute abdominal pelvic process.  There is some hepatic steatosis and aortic atherosclerosis.  CMP has some mild hyperglycemia without any other significant electrolyte or metabolic arrangements.  No evidence of hepatitis or cholestasis.  CBC is unremarkable.  UA with some bacteria protein ketones and small hemoglobin.  While there is is no findings on history, exam, or CT of pyelonephritis given he does have some bacteria in his urine is reporting some burning urination we will treat for possible cystitis.  Will cover for STDs as well with  dose of Rocephin in the emergency room and a course of doxycycline.  Patient is tolerating p.o. and given stable vitals with eyes reassuring exam work-up patient stating he is feeling better after some Toradol I think he is stable for discharge with outpatient follow-up.  Discharged stable condition.  Strict precautions advised and discussed.  ____________________________________________   FINAL CLINICAL IMPRESSION(S) / ED DIAGNOSES  Final diagnoses:  Chest pain, unspecified type  Urethritis  Diarrhea, unspecified type  Aortic atherosclerosis (HCC)  Hepatic steatosis    Medications  cefTRIAXone (ROCEPHIN) 1 g in sodium chloride 0.9 % 100 mL IVPB (has no administration in time range)  lactated ringers bolus 1,000 mL (0 mLs Intravenous Stopped 05/25/21 1716)  ondansetron (ZOFRAN) injection 4 mg (4 mg Intravenous Given 05/25/21 1550)  morphine 4 MG/ML injection 6 mg (6 mg Intravenous Given 05/25/21 1550)  iohexol (OMNIPAQUE) 300 MG/ML solution 100 mL (100 mLs Intravenous Contrast Given 05/25/21 1557)  ketorolac (TORADOL) 30 MG/ML injection 15 mg (15 mg Intravenous Given 05/25/21 1637)  dicyclomine (BENTYL) capsule 10 mg (10 mg Oral Given 05/25/21 1715)     ED Discharge Orders          Ordered    ondansetron (ZOFRAN) 4 MG tablet  Every 8 hours PRN        05/25/21 1735    doxycycline (VIBRAMYCIN) 100 MG capsule  2 times daily        05/25/21 1735             Note:  This document was prepared using Dragon voice recognition software and may include unintentional dictation errors.    Lucrezia Starch, MD 05/25/21 1739

## 2021-05-25 NOTE — Discharge Instructions (Addendum)
Your CT today showed 1. Nonobstructive left 4 mm nephrolithiasis. 2. Sigmoid diverticulosis with no acute diverticulitis. 3. Hepatic steatosis. 4.  Aortic Atherosclerosis (ICD10-I70.0).

## 2021-05-25 NOTE — ED Triage Notes (Signed)
C/O lower abdominal pain and right lower back pain "for several years".  States has irritable bowl, diverticulosis, kidney stones, and a spot on liver that is cancer"  AAOx3.  Skin warm and dry. NAD

## 2021-05-26 LAB — URINE CULTURE: Culture: NO GROWTH

## 2021-11-03 ENCOUNTER — Encounter: Payer: Self-pay | Admitting: Urology

## 2021-11-03 ENCOUNTER — Ambulatory Visit: Payer: PRIVATE HEALTH INSURANCE | Admitting: Urology

## 2021-12-05 ENCOUNTER — Telehealth: Payer: Self-pay | Admitting: *Deleted

## 2021-12-05 ENCOUNTER — Encounter: Payer: Self-pay | Admitting: Internal Medicine

## 2021-12-05 NOTE — Progress Notes (Signed)
I spoke to patient?s wife regarding concerns for possible bladder cancer- as per PCP.  Recommend re-evaluation with urology. She will contact urology office for further recommendations.  ? ?Please schedule appt in 2-3 weeks; MD; labs- cbc/ cmp/ L[facility]. Secure chat sent.  ? ?

## 2021-12-05 NOTE — Telephone Encounter (Signed)
CAll returned to patient mother and informed of Dr B response she asked if patient ha cancer wouldn't he need to see Dr B. I explained to her that there are many types of cancer and not all need an oncologist and the first place to start is with Urology who are very good at referring to [place] if needed. She states that she will call Dr Thressa Sheller office to make an appointment for him as I informed her that hours missed his 4/6 appointment with Dr Glori Luis. ?

## 2021-12-05 NOTE — Telephone Encounter (Signed)
Message from patient mother stating that patient other doctor, Dr Iona Beard has told him that he has bladder cancer (melanoma) and she is asking for Dr B to review records and call her back, because they are not doing anything about it. Wondering if he does or does not have cancer of the bladder. ?

## 2021-12-07 ENCOUNTER — Other Ambulatory Visit: Payer: Self-pay

## 2021-12-07 DIAGNOSIS — D729 Disorder of white blood cells, unspecified: Secondary | ICD-10-CM

## 2021-12-22 ENCOUNTER — Inpatient Hospital Stay (HOSPITAL_BASED_OUTPATIENT_CLINIC_OR_DEPARTMENT_OTHER): Payer: Medicaid Other | Admitting: Internal Medicine

## 2021-12-22 ENCOUNTER — Inpatient Hospital Stay: Payer: Medicaid Other | Attending: Internal Medicine

## 2021-12-22 ENCOUNTER — Encounter: Payer: Self-pay | Admitting: Internal Medicine

## 2021-12-22 DIAGNOSIS — R197 Diarrhea, unspecified: Secondary | ICD-10-CM | POA: Diagnosis not present

## 2021-12-22 DIAGNOSIS — Z8551 Personal history of malignant neoplasm of bladder: Secondary | ICD-10-CM | POA: Diagnosis present

## 2021-12-22 DIAGNOSIS — R110 Nausea: Secondary | ICD-10-CM | POA: Insufficient documentation

## 2021-12-22 DIAGNOSIS — R140 Abdominal distension (gaseous): Secondary | ICD-10-CM | POA: Diagnosis not present

## 2021-12-22 DIAGNOSIS — R109 Unspecified abdominal pain: Secondary | ICD-10-CM | POA: Insufficient documentation

## 2021-12-22 DIAGNOSIS — F17290 Nicotine dependence, other tobacco product, uncomplicated: Secondary | ICD-10-CM | POA: Diagnosis not present

## 2021-12-22 DIAGNOSIS — Z7984 Long term (current) use of oral hypoglycemic drugs: Secondary | ICD-10-CM | POA: Diagnosis not present

## 2021-12-22 DIAGNOSIS — D729 Disorder of white blood cells, unspecified: Secondary | ICD-10-CM | POA: Diagnosis not present

## 2021-12-22 DIAGNOSIS — Z79899 Other long term (current) drug therapy: Secondary | ICD-10-CM | POA: Insufficient documentation

## 2021-12-22 DIAGNOSIS — R160 Hepatomegaly, not elsewhere classified: Secondary | ICD-10-CM | POA: Insufficient documentation

## 2021-12-22 DIAGNOSIS — F17210 Nicotine dependence, cigarettes, uncomplicated: Secondary | ICD-10-CM | POA: Diagnosis not present

## 2021-12-22 DIAGNOSIS — E1165 Type 2 diabetes mellitus with hyperglycemia: Secondary | ICD-10-CM | POA: Diagnosis not present

## 2021-12-22 DIAGNOSIS — R630 Anorexia: Secondary | ICD-10-CM | POA: Diagnosis not present

## 2021-12-22 DIAGNOSIS — G8929 Other chronic pain: Secondary | ICD-10-CM | POA: Insufficient documentation

## 2021-12-22 DIAGNOSIS — R42 Dizziness and giddiness: Secondary | ICD-10-CM | POA: Diagnosis not present

## 2021-12-22 LAB — COMPREHENSIVE METABOLIC PANEL
ALT: 15 U/L (ref 0–44)
AST: 18 U/L (ref 15–41)
Albumin: 4 g/dL (ref 3.5–5.0)
Alkaline Phosphatase: 69 U/L (ref 38–126)
Anion gap: 8 (ref 5–15)
BUN: 20 mg/dL (ref 6–20)
CO2: 23 mmol/L (ref 22–32)
Calcium: 9.4 mg/dL (ref 8.9–10.3)
Chloride: 105 mmol/L (ref 98–111)
Creatinine, Ser: 0.89 mg/dL (ref 0.61–1.24)
GFR, Estimated: >60 mL/min (ref >60)
Glucose, Bld: 358 mg/dL — ABNORMAL HIGH (ref 70–99)
Potassium: 4.7 mmol/L (ref 3.5–5.1)
Sodium: 136 mmol/L (ref 135–145)
Total Bilirubin: 0.3 mg/dL (ref 0.3–1.2)
Total Protein: 7.6 g/dL (ref 6.5–8.1)

## 2021-12-22 LAB — CBC WITH DIFFERENTIAL/PLATELET
Abs Immature Granulocytes: 0.07 10*3/uL (ref 0.00–0.07)
Basophils Absolute: 0.1 10*3/uL (ref 0.0–0.1)
Basophils Relative: 1 %
Eosinophils Absolute: 0.7 10*3/uL — ABNORMAL HIGH (ref 0.0–0.5)
Eosinophils Relative: 7 %
HCT: 41 % (ref 39.0–52.0)
Hemoglobin: 13.7 g/dL (ref 13.0–17.0)
Immature Granulocytes: 1 %
Lymphocytes Relative: 28 %
Lymphs Abs: 2.9 10*3/uL (ref 0.7–4.0)
MCH: 30.6 pg (ref 26.0–34.0)
MCHC: 33.4 g/dL (ref 30.0–36.0)
MCV: 91.5 fL (ref 80.0–100.0)
Monocytes Absolute: 0.6 10*3/uL (ref 0.1–1.0)
Monocytes Relative: 6 %
Neutro Abs: 5.9 10*3/uL (ref 1.7–7.7)
Neutrophils Relative %: 57 %
Platelets: 356 10*3/uL (ref 150–400)
RBC: 4.48 MIL/uL (ref 4.22–5.81)
RDW: 13.6 % (ref 11.5–15.5)
WBC: 10.3 10*3/uL (ref 4.0–10.5)
nRBC: 0 % (ref 0.0–0.2)

## 2021-12-22 LAB — LACTATE DEHYDROGENASE: LDH: 130 U/L (ref 98–192)

## 2021-12-22 NOTE — Progress Notes (Signed)
Pt has recently been to the [facility] ED, this week, enlarged liver.  C/o constant pain, bloating, diarrhea and not eating.  Bp going up and down.

## 2021-12-22 NOTE — Patient Instructions (Signed)
#   REcommend follow up wth GI doctor re: diarrhea

## 2021-12-22 NOTE — Progress Notes (Signed)
[place] CONSULT NOTE  Patient Care Team: [place], [facility] as PCP - General Cammie Sickle, MD as Consulting Physician (Oncology)  CHIEF COMPLAINTS/PURPOSE OF CONSULTATION: Bladder cancer  #Chronic leukocytosis mild neutrophilia  # February 2021-[facility]-cystoscopy negative; no malignant cells  #Poorly controlled diabetes-hemoglobin A1c February 2021 9.5  Oncology History   No history exists.     HISTORY OF PRESENTING ILLNESS:  Charles Beasley 46 y.o.  male history of smoking/depression; poorly controlled diabetes is here for follow-up of of leukocytosis/neutrophilia/"history of abnormal urine cytology"  Patient is currently has multiple complaints including chronic abdominal pain bloating diarrhea poor appetite.  Complains of nausea.  Complains of dizziness.  Joint aches.  Patient admits to poor states his diabetes poorly controlled hemoglobin A1c around 9. Pt has recently been to the [facility] ED, this week, enlarged liver.  Overall feels poor.  Review of Systems  Constitutional:  Positive for malaise/fatigue and weight loss. Negative for chills, diaphoresis and fever.  HENT:  Negative for nosebleeds and sore throat.   Eyes:  Negative for double vision.  Respiratory:  Negative for cough, hemoptysis, sputum production, shortness of breath and wheezing.   Cardiovascular:  Negative for chest pain, palpitations, orthopnea and leg swelling.  Gastrointestinal:  Positive for abdominal pain and nausea. Negative for blood in stool, constipation, diarrhea, heartburn, melena and vomiting.  Genitourinary:  Negative for dysuria, frequency and urgency.  Musculoskeletal:  Positive for back pain. Negative for joint pain.  Skin: Negative.  Negative for itching and rash.  Neurological:  Negative for dizziness, tingling, focal weakness, weakness and headaches.  Endo/Heme/Allergies:  Does not bruise/bleed easily.  Psychiatric/Behavioral:  Negative for depression. The  patient is not nervous/anxious and does not have insomnia.     MEDICAL HISTORY:  Past Medical History:  Diagnosis Date   Asthma    Bladder cancer ([place])    Colitis    Diabetes mellitus without complication (HCC)    Diverticulosis    GERD (gastroesophageal reflux disease)    History of kidney stones    IBS (irritable bowel syndrome)     SURGICAL HISTORY: Past Surgical History:  Procedure Laterality Date   COLONOSCOPY WITH PROPOFOL N/A 01/22/2020   Procedure: COLONOSCOPY WITH PROPOFOL;  Surgeon: Virgel Manifold, MD;  Location: [facility] ENDOSCOPY;  Service: Endoscopy;  Laterality: N/A;   ESOPHAGOGASTRODUODENOSCOPY (EGD) WITH PROPOFOL N/A 01/22/2020   Procedure: ESOPHAGOGASTRODUODENOSCOPY (EGD) WITH PROPOFOL;  Surgeon: Virgel Manifold, MD;  Location: [facility] ENDOSCOPY;  Service: Endoscopy;  Laterality: N/A;    SOCIAL HISTORY: Social History   Socioeconomic History   Marital status: Married    Spouse name: Not on file   Number of children: Not on file   Years of education: Not on file   Highest education level: Not on file  Occupational History   Not on file  Tobacco Use   Smoking status: Former    Types: Cigarettes   Smokeless tobacco: Never  Vaping Use   Vaping Use: Every day  Substance and Sexual Activity   Alcohol use: No    Alcohol/week: 0.0 standard drinks   Drug use: No   Sexual activity: Not on file  Other Topics Concern   Not on file  Social History Narrative   ** Merged History Encounter **       Social Determinants of Health   Financial Resource Strain: Not on file  Food Insecurity: Not on file  Transportation Needs: Not on file  Physical Activity: Not on file  Stress: Not on file  Social Connections: Not on file  Intimate Partner Violence: Not on file    FAMILY HISTORY: Family History  Problem Relation Age of Onset   Breast cancer Mother    Hypertension Father    Prostate cancer Neg Hx    Bladder Cancer Neg Hx    Kidney cancer Neg Hx      ALLERGIES:  is allergic to montelukast, sertraline, and tamsulosin.  MEDICATIONS:  Current Outpatient Medications  Medication Sig Dispense Refill   ACCU-CHEK GUIDE test strip 2 (two) times daily. for testing as directed     Accu-Chek Softclix Lancets lancets 1 each 3 (three) times daily.     busPIRone (BUSPAR) 7.5 MG tablet Take 7.5 mg by mouth 2 (two) times daily.     citalopram (CELEXA) 20 MG tablet Take 20 mg by mouth daily.     escitalopram (LEXAPRO) 10 MG tablet Take 1 tablet by mouth daily.     glipiZIDE (GLUCOTROL XL) 5 MG 24 hr tablet Take 5 mg by mouth daily.     metFORMIN (GLUCOPHAGE) 1000 MG tablet Take 1,000 mg by mouth 2 (two) times daily.     ondansetron (ZOFRAN) 4 MG tablet Take 1 tablet (4 mg total) by mouth every 8 (eight) hours as needed for up to 10 doses for nausea or vomiting. 10 tablet 0   oxybutynin (DITROPAN-XL) 10 MG 24 hr tablet Take 10 mg by mouth daily.     simvastatin (ZOCOR) 20 MG tablet Take 20 mg by mouth at bedtime.     No current facility-administered medications for this visit.      Marland Kitchen  PHYSICAL EXAMINATION: ECOG PERFORMANCE STATUS: 1 - Symptomatic but completely ambulatory  Vitals:   12/22/21 1034  BP: 129/89  Pulse: 91  Temp: 98 F (36.7 C)  SpO2: 98%   Filed Weights   12/22/21 1034  Weight: 149 lb (67.6 kg)    Physical Exam HENT:     Head: Normocephalic and atraumatic.     Mouth/Throat:     Pharynx: No oropharyngeal exudate.  Eyes:     Pupils: Pupils are equal, round, and reactive to light.  Cardiovascular:     Rate and Rhythm: Normal rate and regular rhythm.  Pulmonary:     Effort: Pulmonary effort is normal. No respiratory distress.     Breath sounds: Normal breath sounds. No wheezing.  Abdominal:     General: Bowel sounds are normal. There is no distension.     Palpations: Abdomen is soft. There is no mass.     Tenderness: There is no abdominal tenderness. There is no guarding or rebound.  Musculoskeletal:         General: No tenderness. Normal range of motion.     Cervical back: Normal range of motion and neck supple.  Skin:    General: Skin is warm.  Neurological:     Mental Status: He is alert and oriented to person, place, and time.  Psychiatric:        Mood and Affect: Affect normal.     LABORATORY DATA:  I have reviewed the data as listed Lab Results  Component Value Date   WBC 10.3 12/22/2021   HGB 13.7 12/22/2021   HCT 41.0 12/22/2021   MCV 91.5 12/22/2021   PLT 356 12/22/2021   Recent Labs    05/25/21 1029 12/22/21 1018  NA 136 136  K 4.0 4.7  CL 106 105  CO2 24 23  GLUCOSE 201* 358*  BUN 14 20  CREATININE 0.82 0.89  CALCIUM 9.0 9.4  GFRNONAA >60 >60  PROT 7.7 7.6  ALBUMIN 3.9 4.0  AST 18 18  ALT 16 15  ALKPHOS 61 69  BILITOT 0.4 0.3    RADIOGRAPHIC STUDIES: I have personally reviewed the radiological images as listed and agreed with the findings in the report. No results found.   ASSESSMENT & PLAN:   Neutrophilia # Leucocytosis/Neutrophilia-White count chronic intermittent 11-19.  Normal hemoglobin/platelets.  Suspect secondary to infection/inflammation rather than any malignant process. NEGATIVE- BCR ABL; peripheral smear- unremarkable.  No bone marrow biopsy done.  May 2023 -CBC today within normal limits.  #Chronic abdominal pain /constipation unclear etiology -reviewed EGD/Colo- 6//2021- NEG.CT scan [facility] May 2023 /ER -normal liver contour. Enlarged liver measuring 17 cm in the mid axillary line/likely from fatty liver diffuse hepatic steatosis. Subcentimeter hypoattenuating lesion in hepatic segment VIII, unchanged.  Defer to [facility] for further evaluation.   #History of abnormal urine cytology: last evaluation with Urology [Dr.Sininski- April 2022]; awaiting re- evaluation in June 2023.   #fatigue/multiple other complaints including diarrhea nausea dizziness I suspect secondary to- poorly controlled diabetes- BG-358-poorly controlled-hemoglobin A1c 9.5.   Recommend follow-up with PCP regarding management.   # Smoking-quit smoking; patient states he is vaping wean himself off smoking.  # DISPOSITION:  # follow up as needed- Dr.B  I left a detailed voicemail for the patient's wife with normal recommendations.  I also messaged patient's mother regarding above recommendations.  Cc; PCP.   All questions were answered. The patient knows to call the clinic with any problems, questions or concerns.   Cammie Sickle, MD 12/22/2021 8:21 PM

## 2021-12-22 NOTE — Assessment & Plan Note (Addendum)
#  Leucocytosis/Neutrophilia-White count chronic intermittent 11-19.  Normal hemoglobin/platelets.  Suspect secondary to infection/inflammation rather than any malignant process. NEGATIVE- BCR ABL; peripheral smear- unremarkable.  No bone marrow biopsy done.  May 2023 -CBC today within normal limits.  #Chronic abdominal pain /constipation unclear etiology -reviewed EGD/Colo- 6//2021- NEG.CT scan [facility] May 2023 /ER -normal liver contour. Enlarged liver measuring 17 cm in the mid axillary line/likely from fatty liver diffuse hepatic steatosis. Subcentimeter hypoattenuating lesion in hepatic segment VIII, unchanged.  Defer to [facility] for further evaluation.   #History of abnormal urine cytology: last evaluation with Urology [Dr.Sininski- April 2022]; awaiting re- evaluation in June 2023.   #fatigue/multiple other complaints including diarrhea nausea dizziness I suspect secondary to- poorly controlled diabetes- BG-358-poorly controlled-hemoglobin A1c 9.5.  Recommend follow-up with PCP regarding management.   # Smoking-quit smoking; patient states he is vaping wean himself off smoking.  # DISPOSITION:  # follow up as needed- Dr.B  I left a detailed voicemail for the patient's wife with normal recommendations.  I also messaged patient's mother regarding above recommendations.  Cc; PCP.

## 2022-01-04 ENCOUNTER — Encounter: Payer: Self-pay | Admitting: Urology

## 2022-01-04 ENCOUNTER — Ambulatory Visit (INDEPENDENT_AMBULATORY_CARE_PROVIDER_SITE_OTHER): Payer: BC Managed Care – PPO | Admitting: Urology

## 2022-01-04 VITALS — BP 129/87 | HR 92 | Ht 69.0 in | Wt 149.0 lb

## 2022-01-04 DIAGNOSIS — R3915 Urgency of urination: Secondary | ICD-10-CM

## 2022-01-04 DIAGNOSIS — N200 Calculus of kidney: Secondary | ICD-10-CM | POA: Diagnosis not present

## 2022-01-04 DIAGNOSIS — R350 Frequency of micturition: Secondary | ICD-10-CM | POA: Diagnosis not present

## 2022-01-04 DIAGNOSIS — N3281 Overactive bladder: Secondary | ICD-10-CM | POA: Diagnosis not present

## 2022-01-04 DIAGNOSIS — N3941 Urge incontinence: Secondary | ICD-10-CM

## 2022-01-04 LAB — URINALYSIS, COMPLETE
Bilirubin, UA: NEGATIVE
Ketones, UA: NEGATIVE
Leukocytes,UA: NEGATIVE
Nitrite, UA: NEGATIVE
Protein,UA: NEGATIVE
RBC, UA: NEGATIVE
Specific Gravity, UA: <1.005 — ABNORMAL LOW (ref 1.005–1.030)
Urobilinogen, Ur: 0.2 mg/dL (ref 0.2–1.0)
pH, UA: 5.5 (ref 5.0–7.5)

## 2022-01-04 LAB — MICROSCOPIC EXAMINATION: Bacteria, UA: NONE SEEN

## 2022-01-04 LAB — BLADDER SCAN AMB NON-IMAGING

## 2022-01-04 MED ORDER — OXYBUTYNIN CHLORIDE ER 10 MG PO TB24: 10.0000 mg | ORAL_TABLET | Freq: Every day | ORAL | 11 refills | Status: AC

## 2022-01-04 NOTE — Patient Instructions (Signed)

## 2022-01-04 NOTE — Progress Notes (Signed)
   01/04/2022 4:13 PM   Charles Beasley [birth year] [id number]  Reason for visit: Follow up nephrolithiasis, history of atypical cytology, urinary symptoms  HPI: I saw [name] for yearly follow-up.  To briefly summarize his history, he is a 46 year old male with history of multiple ER visits for abdominal pain, as well as history of nephrolithiasis.  He has been seen by urology at both [facility] and [place].  He underwent left ureteroscopy and laser lithotripsy with stent placement at [facility] for a 5 mm left ureteral stone on 05/08/2019, and stent was not removed until 08/04/2019.  There was some erythema at time of stent removal.  He also had a CT with some bladder wall thickening.  He underwent a cystoscopy with Dr. Thurmond Butts at [facility] on 08/14/2019 which was essentially normal aside from some very mild erythema at the trigone.  Cytology was sent and was atypical.  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  He then was seen at [facility] on 09/12/2019 and underwent another cystoscopy that was reportedly completely normal with no erythema or abnormalities.  Cytology sent from that bladder wash was negative.  I suspect his history of abnormal cytology was secondary to having his indwelling ureteral stent and for 3 months, and he has never had any indication of bladder cancer or any bladder lesions that would warrant biopsy, and multiple follow-up urinalyses have been benign with no microscopic hematuria or other worrisome findings.   He was admitted on 11/19/2019 to [facility] ED with a 3 mm right ureteral stone that ultimately passed spontaneously.  He has not tolerated stents well in the past.  Urinalysis today completely benign with 0-5 WBCs, 0-2 RBCs, no bacteria, nitrite negative, no leukocytes,   I personally viewed and interpreted his CT from 05/25/2021 that shows no hydronephrosis or ureteral stones, 5 mm left lower pole  nonobstructive stone, normal-appearing bladder.  Regarding his left lower pole nonobstructive stone, he opts to continue surveillance, would consider shockwave in the future if this migrates to the ureter based on his poor tolerance of ureteral stents in the past.  He has poorly controlled diabetes with most recent hemoglobin A1c of 9. His primary complaint today is urinary frequency urgency, and occasional urge incontinence.  PVR today is normal at 75m. He drinks diet soda during the day.  We discussed the relationship between poorly controlled diabetes and urinary symptoms, as well as artificial sweeteners and carbonated beverages and their effect on overactive bladder symptoms.  Behavioral strategies discussed at length.  At this point he is interested in a trial of medication, and I sent in oxybutynin 10 mg XL daily, risk and benefits were discussed, he will contact [facility] My[facility] in the next 1 to 2 months to let [facility] how this medication is working.  Trial of oxybutynin 10 mg XL daily for OAB RTC 1 year UA, PVR, KUB   BBilley Co MD  [facility] [address] [place] [phone]

## 2022-04-05 ENCOUNTER — Emergency Department: Payer: BC Managed Care – PPO

## 2022-04-05 ENCOUNTER — Other Ambulatory Visit: Payer: Self-pay

## 2022-04-05 ENCOUNTER — Emergency Department
Admission: EM | Admit: 2022-04-05 | Discharge: 2022-04-05 | Disposition: A | Discharge status: Home / Self Care | Payer: BC Managed Care – PPO | Attending: Emergency Medicine | Admitting: Emergency Medicine

## 2022-04-05 ENCOUNTER — Encounter: Payer: Self-pay | Admitting: Emergency Medicine

## 2022-04-05 DIAGNOSIS — D72829 Elevated white blood cell count, unspecified: Secondary | ICD-10-CM | POA: Insufficient documentation

## 2022-04-05 DIAGNOSIS — L03115 Cellulitis of right lower limb: Secondary | ICD-10-CM | POA: Diagnosis not present

## 2022-04-05 DIAGNOSIS — J45909 Unspecified asthma, uncomplicated: Secondary | ICD-10-CM | POA: Insufficient documentation

## 2022-04-05 DIAGNOSIS — R110 Nausea: Secondary | ICD-10-CM | POA: Diagnosis not present

## 2022-04-05 DIAGNOSIS — E119 Type 2 diabetes mellitus without complications: Secondary | ICD-10-CM | POA: Insufficient documentation

## 2022-04-05 DIAGNOSIS — M25561 Pain in right knee: Secondary | ICD-10-CM | POA: Diagnosis present

## 2022-04-05 LAB — CBC WITH DIFFERENTIAL/PLATELET
Abs Immature Granulocytes: 0.08 10*3/uL — ABNORMAL HIGH (ref 0.00–0.07)
Basophils Absolute: 0.1 10*3/uL (ref 0.0–0.1)
Basophils Relative: 1 %
Eosinophils Absolute: 0.6 10*3/uL — ABNORMAL HIGH (ref 0.0–0.5)
Eosinophils Relative: 4 %
HCT: 38.5 % — ABNORMAL LOW (ref 39.0–52.0)
Hemoglobin: 12.8 g/dL — ABNORMAL LOW (ref 13.0–17.0)
Immature Granulocytes: 1 %
Lymphocytes Relative: 25 %
Lymphs Abs: 4 10*3/uL (ref 0.7–4.0)
MCH: 30.3 pg (ref 26.0–34.0)
MCHC: 33.2 g/dL (ref 30.0–36.0)
MCV: 91.2 fL (ref 80.0–100.0)
Monocytes Absolute: 0.8 10*3/uL (ref 0.1–1.0)
Monocytes Relative: 5 %
Neutro Abs: 10.4 10*3/uL — ABNORMAL HIGH (ref 1.7–7.7)
Neutrophils Relative %: 64 %
Platelets: 389 10*3/uL (ref 150–400)
RBC: 4.22 MIL/uL (ref 4.22–5.81)
RDW: 13.6 % (ref 11.5–15.5)
WBC: 16 10*3/uL — ABNORMAL HIGH (ref 4.0–10.5)
nRBC: 0 % (ref 0.0–0.2)

## 2022-04-05 LAB — BASIC METABOLIC PANEL
Anion gap: 9 (ref 5–15)
BUN: 14 mg/dL (ref 6–20)
CO2: 22 mmol/L (ref 22–32)
Calcium: 9.1 mg/dL (ref 8.9–10.3)
Chloride: 105 mmol/L (ref 98–111)
Creatinine, Ser: 0.74 mg/dL (ref 0.61–1.24)
GFR, Estimated: >60 mL/min (ref >60)
Glucose, Bld: 254 mg/dL — ABNORMAL HIGH (ref 70–99)
Potassium: 3.7 mmol/L (ref 3.5–5.1)
Sodium: 136 mmol/L (ref 135–145)

## 2022-04-05 MED ORDER — SULFAMETHOXAZOLE-TRIMETHOPRIM 800-160 MG PO TABS: 1.0000 | ORAL_TABLET | Freq: Two times a day (BID) | ORAL | 0 refills | Status: DC

## 2022-04-05 MED ORDER — ACETAMINOPHEN-CODEINE 300-30 MG PO TABS: 1.0000 | ORAL_TABLET | Freq: Four times a day (QID) | ORAL | 0 refills | Status: DC | PRN

## 2022-04-05 NOTE — Discharge Instructions (Signed)
Follow-up with primary care if not improving over the next 2 to 3 days.  Return to the emergency department for symptoms that change or worsen if you are unable to schedule an appointment.

## 2022-04-05 NOTE — ED Provider Notes (Signed)
[facility] Provider Note    Event Date/Time   First MD Initiated Contact with Patient 04/05/22 1735     (approximate)   History   Insect Bite   HPI  Charles Beasley is a 46 y.o. male  with history of DM, asthma, colitis and as listed in EMR presents to the emergency department for evaluation of right knee pain with area of erythema and tenderness on the lateral aspect.  Around 10 AM, he noted an enlarged area that came "to a head."  The area drained serosanguineous fluid and now he has a black spot in the area that drained.  Since that time he has had continued pain in the right knee which increases with ambulation.  He has had chills today but no fever.  He is also had some occasional nausea.  No rash or difficulty breathing or swallowing.  He did not see anything bite him.  No similar symptoms in the past..      Physical Exam   Triage Vital Signs: ED Triage Vitals  Enc Vitals Group     BP 04/05/22 1725 135/86     Pulse Rate 04/05/22 1725 95     Resp 04/05/22 1725 18     Temp 04/05/22 1725 97.8 F (36.6 C)     Temp src --      SpO2 04/05/22 1725 94 %     Weight 04/05/22 1724 153 lb (69.4 kg)     Height 04/05/22 1724 '5\' 8"'$  (1.727 m)     Head Circumference --      Peak Flow --      Pain Score --      Pain Loc --      Pain Edu? --      Excl. in [place]? --     Most recent vital signs: Vitals:   04/05/22 1725 04/05/22 1930  BP: 135/86 121/82  Pulse: 95 84  Resp: 18 17  Temp: 97.8 F (36.6 C) 97.7 F (36.5 C)  SpO2: 94% 98%    General: Awake, no distress.  CV:  Good peripheral perfusion.  Resp:  Normal effort.  Abd:  No distention. Other:  Slightly erythematous, nonfluctuant, not indurated area over the right patella on the lateral aspect without associated lymphangitis.  No joint effusion.  No tenderness in the popliteal space.   ED Results / Procedures / Treatments   Labs (all labs ordered are listed, but only abnormal results are  displayed) Labs Reviewed  CBC WITH DIFFERENTIAL/PLATELET - Abnormal; Notable for the following components:      Result Value   WBC 16.0 (*)    Hemoglobin 12.8 (*)    HCT 38.5 (*)    Neutro Abs 10.4 (*)    Eosinophils Absolute 0.6 (*)    Abs Immature Granulocytes 0.08 (*)    All other components within normal limits  BASIC METABOLIC PANEL - Abnormal; Notable for the following components:   Glucose, Bld 254 (*)    All other components within normal limits     EKG  Not indicated.   RADIOLOGY  Image interpreted and radiology report reviewed by me.  Image of the right knee negative for acute bony abnormality or effusion.  PROCEDURES:  Critical Care performed: No  Procedures   MEDICATIONS ORDERED IN ED: Medications - No data to display   IMPRESSION / MDM / [place] / ED COURSE   I have reviewed the triage note.  Differential diagnosis includes, but  is not limited to,   46 year old male presenting to the emergency department for treatment and evaluation of pain and lesion to the right knee that he noticed this morning.  See HPI for further details.  While awaiting ER room assignment, labs were obtained which show white blood cell count of 16,000 with a slight left shift at 10.4.  On exam, there is very little erythema, no edema appreciated and no area of fluctuance over the lateral aspect of the right patella where he states he had serosanguineous drainage earlier today.  Vital signs are without concern for sepsis.  Plan will be to treat him with a Bactrim due to uncontrolled type 2 diabetes with an increase in white blood cell count and left shift.  PDMP review shows that primary care had given him a prescription for Tylenol 3 in the past which is what he will be given today for pain control.  Patient discharged with instructions to follow-up with primary care if not improving over the next 2 to 3 days.  He was instructed to return to the emergency department if  symptoms change or worsen and he is unable to schedule an appointment.  After discharge, he was noted ambulating down the hallway with slight limp but otherwise steady gait.     FINAL CLINICAL IMPRESSION(S) / ED DIAGNOSES   Final diagnoses:  Cellulitis of right lower extremity     Rx / DC Orders   ED Discharge Orders          Ordered    sulfamethoxazole-trimethoprim (BACTRIM DS) 800-160 MG tablet  2 times daily        04/05/22 1914    acetaminophen-codeine (TYLENOL #3) 300-30 MG tablet  Every 6 hours PRN        04/05/22 1914             Note:  This document was prepared using Dragon voice recognition software and may include unintentional dictation errors.   Victorino Dike, FNP 04/05/22 1953    Blake Divine, MD 04/06/22 [phone]

## 2022-04-05 NOTE — ED Provider Triage Note (Signed)
Emergency Medicine Provider Triage Evaluation Note  Charles Beasley , a 46 y.o. male  was evaluated in triage.  Pt complains of insect bite at 10am to right lateral knee. Did not see what bit him. Reports itchy and painful. History of diabetes and bladder cancer. Been icing it, feels swelling is worse. Pain with moving knee. Reports it popped and blood and clear liquid came out. Feels worse since then  Review of Systems  Positive: Insect bite Negative: fever  Physical Exam  There were no vitals taken for this visit. Gen:   Awake, no distress   Resp:  Normal effort  MSK:   Moves extremities without difficulty  Other:    Medical Decision Making  Medically screening exam initiated at 5:22 PM.  Appropriate orders placed.  Charles Beasley was informed that the remainder of the evaluation will be completed by another provider, this initial triage assessment does not replace that evaluation, and the importance of remaining in the ED until their evaluation is complete.     Marquette Old, PA-C 04/05/22 1725

## 2022-04-05 NOTE — ED Notes (Signed)
Pt verbalized understanding of discharge instructions, prescriptions, and follow-up care instructions. Pt advised if symptoms worsen return to ED.

## 2022-04-05 NOTE — ED Triage Notes (Signed)
Pt via POV from home. Pt has insect bite to R knee to unknown insect. States that the pain has been getting worse and swelling also getting worse. Pt is A&Ox4 and NAD

## 2022-04-08 ENCOUNTER — Other Ambulatory Visit: Payer: Self-pay

## 2022-04-08 ENCOUNTER — Emergency Department
Admission: EM | Admit: 2022-04-08 | Discharge: 2022-04-08 | Disposition: A | Discharge status: Home / Self Care | Payer: BC Managed Care – PPO | Attending: Emergency Medicine | Admitting: Emergency Medicine

## 2022-04-08 ENCOUNTER — Encounter: Payer: Self-pay | Admitting: Emergency Medicine

## 2022-04-08 DIAGNOSIS — L03115 Cellulitis of right lower limb: Secondary | ICD-10-CM | POA: Insufficient documentation

## 2022-04-08 DIAGNOSIS — L03119 Cellulitis of unspecified part of limb: Secondary | ICD-10-CM

## 2022-04-08 DIAGNOSIS — J45909 Unspecified asthma, uncomplicated: Secondary | ICD-10-CM | POA: Diagnosis not present

## 2022-04-08 DIAGNOSIS — L539 Erythematous condition, unspecified: Secondary | ICD-10-CM | POA: Diagnosis present

## 2022-04-08 DIAGNOSIS — Z8551 Personal history of malignant neoplasm of bladder: Secondary | ICD-10-CM | POA: Insufficient documentation

## 2022-04-08 DIAGNOSIS — E119 Type 2 diabetes mellitus without complications: Secondary | ICD-10-CM | POA: Insufficient documentation

## 2022-04-08 DIAGNOSIS — L02415 Cutaneous abscess of right lower limb: Secondary | ICD-10-CM | POA: Insufficient documentation

## 2022-04-08 MED ORDER — OXYCODONE-ACETAMINOPHEN 5-325 MG PO TABS
1.0000 | ORAL_TABLET | Freq: Once | ORAL | Status: AC
  Administered 2022-04-08: 1 via ORAL
  Filled 2022-04-08: qty 1

## 2022-04-08 MED ORDER — LIDOCAINE-EPINEPHRINE 2 %-1:100000 IJ SOLN
20.0000 mL | Freq: Once | INTRAMUSCULAR | Status: AC
  Administered 2022-04-08: 20 mL
  Filled 2022-04-08: qty 1

## 2022-04-08 NOTE — ED Provider Notes (Signed)
[facility] Provider Note    Event Date/Time   First MD Initiated Contact with Patient 04/08/22 1408     (approximate)   History   Insect Bite   HPI  Charles Beasley is a 46 y.o. male past medical history of diabetes, GERD asthma who presents with concern for insect bite.  Last Wednesday patient noticed an area of erythema on the right knee.  He thinks he was bit by a brown recluse spider.  Was seen in the ED 3 days ago with noted that the area was not fluctuant he was prescribed Bactrim.  Since that time pain has worsened and he started to feel unwell.  Had fever at home.  Also feeling very fatigued with sore throat no cough shortness of breath or chest pain.  He has some abdominal discomfort but this is chronic for him.  Denies diarrhea.     Past Medical History:  Diagnosis Date   Asthma    Bladder cancer ([place])    Colitis    Diabetes mellitus without complication (HCC)    Diverticulosis    GERD (gastroesophageal reflux disease)    History of kidney stones    IBS (irritable bowel syndrome)     Patient Active Problem List   Diagnosis Date Noted   Neutrophilia 02/10/2020   Generalized abdominal pain    Encounter for screening colonoscopy    Polyp of colon    Ureterolithiasis 11/19/2019   Intractable pain 68/06/7516   Renal colic on right side 00/17/4944   Ureteral calculus, right 11/18/2019   History of colitis 11/18/2019   UTI (urinary tract infection) 11/18/2019   Abnormal urine cytology 09/30/2019   Colitis 09/06/2019   Diabetes mellitus without complication ([place])    Asthma    Dyshidrotic hand dermatitis 08/29/2018   Aortic atherosclerosis ([place]) 03/09/2017   Calculus of kidney 11/28/2016   Diet-controlled type 2 diabetes mellitus ([place]) 06/21/2016   Tinnitus 03/12/2014   Carpal tunnel syndrome of right wrist 03/12/2014   Depression 02/11/2014   Allergic rhinitis 04/21/2013   Gastroesophageal reflux disease without esophagitis 02/21/2009    IBS (irritable bowel syndrome) 07/31/1989     Physical Exam  Triage Vital Signs: ED Triage Vitals  Enc Vitals Group     BP 04/08/22 1248 129/83     Pulse Rate 04/08/22 1248 74     Resp 04/08/22 1248 18     Temp 04/08/22 1248 97.8 F (36.6 C)     Temp Source 04/08/22 1248 Oral     SpO2 04/08/22 1248 98 %     Weight 04/08/22 1244 153 lb (69.4 kg)     Height 04/08/22 1244 '5\' 8"'$  (1.727 m)     Head Circumference --      Peak Flow --      Pain Score 04/08/22 1244 8     Pain Loc --      Pain Edu? --      Excl. in [place]? --     Most recent vital signs: Vitals:   04/08/22 1248 04/08/22 1423  BP: 129/83 120/79  Pulse: 74 70  Resp: 18 18  Temp: 97.8 F (36.6 C)   SpO2: 98% 98%     General: Awake, no distress.  CV:  Good peripheral perfusion.  Resp:  Normal effort.  Abd:  No distention.  Neuro:             Awake, Alert, Oriented x 3  Other:  There is a small area  of erythema lateral to the right patella that is mildly fluctuant with a central small area of scabbing Patient able to range the knee without difficulty  Mild erythema of the posterior oropharynx   ED Results / Procedures / Treatments  Labs (all labs ordered are listed, but only abnormal results are displayed) Labs Reviewed - No data to display   EKG     RADIOLOGY    PROCEDURES:  Critical Care performed: No  ..Incision and Drainage  Date/Time: 04/08/2022 3:18 PM  Performed by: Rada Hay, MD Authorized by: Rada Hay, MD   Consent:    Consent obtained:  Verbal Universal protocol:    Patient identity confirmed:  Verbally with patient Location:    Type:  Abscess   Size:  1cm   Location:  Lower extremity   Lower extremity location:  Knee   Knee location:  R knee Pre-procedure details:    Skin preparation:  Antiseptic wash Sedation:    Sedation type:  None Anesthesia:    Anesthesia method:  None Procedure type:    Complexity:  Simple Procedure details:    Ultrasound  guidance: no     Incision types:  Stab incision   Incision depth:  Dermal   Drainage:  Purulent   Drainage amount:  Moderate   Wound treatment:  Wound left open   Packing materials:  None Post-procedure details:    Procedure completion:  Tolerated     MEDICATIONS ORDERED IN ED: Medications  oxyCODONE-acetaminophen (PERCOCET/ROXICET) 5-325 MG per tablet 1 tablet (has no administration in time range)  lidocaine-EPINEPHrine (XYLOCAINE W/EPI) 2 %-1:100000 (with pres) injection 20 mL (20 mLs Infiltration Given 04/08/22 1519)     IMPRESSION / MDM / ASSESSMENT AND PLAN / ED COURSE  I reviewed the triage vital signs and the nursing notes.                              Patient's presentation is most consistent with acute, uncomplicated illness.  Differential diagnosis includes, but is not limited to, colitis, folliculitis, abscess, infected spider bite  The patient is a 46 year old male who presents with ongoing concerns of infected insect bite on his right knee.  He was seen in the ED 3 days ago for similar.  Prescribed Bactrim which he has been taking.  He feels systemically unwell with fatigue fevers sore throat.  Is having increasing pain in the knee.  On exam he has a fairly minimal area of erythema with central area of brown scab that is mildly fluctuant.  He is able to range the knee there is no overlying cellulitis in the actual knee.  Not concern for septic joint.  I have low suspicion that this skin findings are the cause of his systemic symptoms and question whether he has an underlying viral illness.  His posterior oropharynx is mildly erythematous as there is no exudate.  Plan to I&D because I suspect there is some purulence underneath.  We will continue Bactrim.    Incision and drainage performed with return of purulence.  Expect that this will now improve.  FINAL CLINICAL IMPRESSION(S) / ED DIAGNOSES   Final diagnoses:  Cellulitis and abscess of leg     Rx / DC Orders    ED Discharge Orders     None        Note:  This document was prepared using Dragon voice recognition software and may include unintentional dictation errors.  Rada Hay, MD 04/08/22 [phone]

## 2022-04-08 NOTE — ED Triage Notes (Signed)
Pt reports was seen here for a brown recluse bite on Thursday and was given an abx and ibuprofen. Pt reports area is worse now and draining. Pt concerned infection is spreading

## 2022-04-08 NOTE — Discharge Instructions (Signed)
Please do warm soaks 4 times a day.  Continue to take antibiotics.  Please follow-up with your primary care provider.

## 2022-04-11 ENCOUNTER — Emergency Department
Admission: EM | Admit: 2022-04-11 | Discharge: 2022-04-11 | Disposition: A | Discharge status: Home / Self Care | Payer: BC Managed Care – PPO | Attending: Emergency Medicine | Admitting: Emergency Medicine

## 2022-04-11 ENCOUNTER — Encounter: Payer: Self-pay | Admitting: Medical Oncology

## 2022-04-11 ENCOUNTER — Other Ambulatory Visit: Payer: Self-pay

## 2022-04-11 DIAGNOSIS — L03115 Cellulitis of right lower limb: Secondary | ICD-10-CM | POA: Diagnosis not present

## 2022-04-11 DIAGNOSIS — Z4800 Encounter for change or removal of nonsurgical wound dressing: Secondary | ICD-10-CM | POA: Diagnosis present

## 2022-04-11 DIAGNOSIS — J45909 Unspecified asthma, uncomplicated: Secondary | ICD-10-CM | POA: Insufficient documentation

## 2022-04-11 DIAGNOSIS — E119 Type 2 diabetes mellitus without complications: Secondary | ICD-10-CM | POA: Insufficient documentation

## 2022-04-11 DIAGNOSIS — Z5189 Encounter for other specified aftercare: Secondary | ICD-10-CM

## 2022-04-11 MED ORDER — CEPHALEXIN 500 MG PO CAPS: 500.0000 mg | ORAL_CAPSULE | Freq: Four times a day (QID) | ORAL | 0 refills | Status: AC

## 2022-04-11 NOTE — ED Provider Notes (Signed)
[facility] Provider Note    Event Date/Time   First MD Initiated Contact with Patient 04/11/22 1507     (approximate)   History   Wound Check   HPI  Charles Beasley is a 46 y.o. male who presents today for wound recheck.  Patient reports that he was told to come to the emergency department for recheck in a couple of days.  He feels that his area of redness is improving.  However, he does not think that it has resolved.  He reports that he has been taking the Bactrim daily.  He denies any constitutional symptoms.  He has not had any fevers or chills.  He has not noticed any swelling in his knee.  He reports the swelling is improved significantly since he was seen a couple of days ago.  He is able to ambulate.  He reports that his pain is also improved significantly.  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/29/2018   Aortic atherosclerosis ([place]) 03/09/2017   Calculus of kidney 11/28/2016   Diet-controlled type 2 diabetes mellitus ([place]) 06/21/2016   Tinnitus 03/12/2014   Carpal tunnel syndrome of right wrist 03/12/2014   Depression 02/11/2014   Allergic rhinitis 04/21/2013   Gastroesophageal reflux disease without esophagitis 02/21/2009   IBS (irritable bowel syndrome) 07/31/1989          Physical Exam   Triage Vital Signs: ED Triage Vitals  Enc Vitals Group     BP 04/11/22 1407 (!) 155/86     Pulse Rate 04/11/22 1407 100     Resp 04/11/22 1407 18     Temp  04/11/22 1407 97.9 F (36.6 C)     Temp Source 04/11/22 1407 Oral     SpO2 04/11/22 1407 96 %     Weight 04/11/22 1401 152 lb 1.9 oz (69 kg)     Height 04/11/22 1401 '5\' 8"'$  (1.727 m)     Head Circumference --      Peak Flow --      Pain Score 04/11/22 1401 7     Pain Loc --      Pain Edu? --      Excl. in [place]? --     Most recent vital signs: Vitals:   04/11/22 1407  BP: (!) 155/86  Pulse: 100  Resp: 18  Temp: 97.9 F (36.6 C)  SpO2: 96%    Physical Exam Vitals and nursing note reviewed.  Constitutional:      General: Awake and alert. No acute distress.    Appearance: Normal appearance. The patient is normal weight.  HENT:     Head: Normocephalic and atraumatic.     Mouth: Mucous membranes are moist.  Eyes:     General: PERRL. Normal EOMs        Right eye: No discharge.        Left eye: No discharge.     Conjunctiva/sclera: Conjunctivae normal.  Cardiovascular:     Rate  and Rhythm: Normal rate and regular rhythm.     Pulses: Normal pulses.  Pulmonary:     Effort: Pulmonary effort is normal. No respiratory distress.  Abdominal:     Abdomen is soft. There is no abdominal tenderness. Musculoskeletal:        General: No swelling. Normal range of motion.     Cervical back: Normal range of motion and neck supple.  Right lower extremity: 1.5 x 1.5 circular erythematous lesion with central scabbing to lateral aspect of knee.  Area is indurated.  No fluctuance.  Patient has full range of motion of knee without effusion.  No lymphangitis.  No crepitus. Skin:    General: Skin is warm and dry.     Capillary Refill: Capillary refill takes less than 2 seconds.     Findings: No rash.  Neurological:     Mental Status: The patient is awake and alert.      ED Results / Procedures / Treatments   Labs (all labs ordered are listed, but only abnormal results are displayed) Labs Reviewed - No data to display   EKG     RADIOLOGY     PROCEDURES:  Critical Care  performed:   Procedures   MEDICATIONS ORDERED IN ED: Medications - No data to display   IMPRESSION / MDM / [place] / ED COURSE  I reviewed the triage vital signs and the nursing notes.   Differential diagnosis includes, but is not limited to, cellulitis, abscess, incomplete wound healing, insect bite.  I reviewed the patient's previous visits.  Wound appears to be significantly improved since that time.  He has no constitutional symptoms, and he is hemodynamically stable and afebrile.  He feels that the swelling has improved significantly himself.  Given that there is likely several bacteria including staph and strep, will prescribe Keflex in addition to the Bactrim that he is already taking.  Patient is in agreement with this as well.  There is no knee effusion, patient has full and normal range of motion of his knee, no constitutional symptoms, I do not suspect a septic knee at this time. I did recommend wound recheck in 2 days.  Patient understands and agrees with plan.  He was discharged in stable condition.   Patient's presentation is most consistent with acute illness / injury with system symptoms.     FINAL CLINICAL IMPRESSION(S) / ED DIAGNOSES   Final diagnoses:  Visit for wound check  Cellulitis of right lower extremity     Rx / DC Orders   ED Discharge Orders          Ordered    cephALEXin (KEFLEX) 500 MG capsule  4 times daily        04/11/22 1535             Note:  This document was prepared using Dragon voice recognition software and may include unintentional dictation errors.   Emeline Gins 04/11/22 Rodena Piety, MD 04/11/22 2235

## 2022-04-11 NOTE — ED Triage Notes (Signed)
Pt here on 9/9 for wound, here for re-check. Taking bactrim, reports pain is improved.

## 2022-04-11 NOTE — Discharge Instructions (Addendum)
Take the Keflex in addition to the Bactrim that you are already taking.  Please return if the area of redness worsens, or if you develop worsening pain to your knee, swelling to your knee, fevers, chills, red streak up your leg, or any other concerns.  It was a pleasure caring for you today.

## 2022-07-30 ENCOUNTER — Emergency Department
Admission: EM | Admit: 2022-07-30 | Discharge: 2022-07-30 | Disposition: A | Discharge status: Home / Self Care | Payer: BC Managed Care – PPO | Attending: Emergency Medicine | Admitting: Emergency Medicine

## 2022-07-30 ENCOUNTER — Other Ambulatory Visit: Payer: Self-pay

## 2022-07-30 ENCOUNTER — Emergency Department: Payer: BC Managed Care – PPO

## 2022-07-30 DIAGNOSIS — E119 Type 2 diabetes mellitus without complications: Secondary | ICD-10-CM | POA: Insufficient documentation

## 2022-07-30 DIAGNOSIS — N200 Calculus of kidney: Secondary | ICD-10-CM | POA: Insufficient documentation

## 2022-07-30 DIAGNOSIS — N23 Unspecified renal colic: Secondary | ICD-10-CM | POA: Diagnosis not present

## 2022-07-30 DIAGNOSIS — J45909 Unspecified asthma, uncomplicated: Secondary | ICD-10-CM | POA: Diagnosis not present

## 2022-07-30 DIAGNOSIS — R109 Unspecified abdominal pain: Secondary | ICD-10-CM | POA: Diagnosis present

## 2022-07-30 LAB — COMPREHENSIVE METABOLIC PANEL
ALT: 15 U/L (ref 0–44)
AST: 18 U/L (ref 15–41)
Albumin: 4.3 g/dL (ref 3.5–5.0)
Alkaline Phosphatase: 67 U/L (ref 38–126)
Anion gap: 10 (ref 5–15)
BUN: 9 mg/dL (ref 6–20)
CO2: 18 mmol/L — ABNORMAL LOW (ref 22–32)
Calcium: 9.4 mg/dL (ref 8.9–10.3)
Chloride: 108 mmol/L (ref 98–111)
Creatinine, Ser: 0.79 mg/dL (ref 0.61–1.24)
GFR, Estimated: >60 mL/min (ref >60)
Glucose, Bld: 116 mg/dL — ABNORMAL HIGH (ref 70–99)
Potassium: 3.9 mmol/L (ref 3.5–5.1)
Sodium: 136 mmol/L (ref 135–145)
Total Bilirubin: 0.6 mg/dL (ref 0.3–1.2)
Total Protein: 7.7 g/dL (ref 6.5–8.1)

## 2022-07-30 LAB — URINALYSIS, ROUTINE W REFLEX MICROSCOPIC
Bacteria, UA: NONE SEEN
Bilirubin Urine: NEGATIVE
Glucose, UA: NEGATIVE mg/dL
Ketones, ur: NEGATIVE mg/dL
Nitrite: NEGATIVE
Protein, ur: 100 mg/dL — AB
RBC / HPF: >50 RBC/hpf — ABNORMAL HIGH (ref 0–5)
Specific Gravity, Urine: 1.018 (ref 1.005–1.030)
WBC, UA: >50 WBC/hpf — ABNORMAL HIGH (ref 0–5)
pH: 5 (ref 5.0–8.0)

## 2022-07-30 LAB — CBC WITH DIFFERENTIAL/PLATELET
Abs Immature Granulocytes: 0.05 10*3/uL (ref 0.00–0.07)
Basophils Absolute: 0.1 10*3/uL (ref 0.0–0.1)
Basophils Relative: 1 %
Eosinophils Absolute: 0.6 10*3/uL — ABNORMAL HIGH (ref 0.0–0.5)
Eosinophils Relative: 4 %
HCT: 41.1 % (ref 39.0–52.0)
Hemoglobin: 14 g/dL (ref 13.0–17.0)
Immature Granulocytes: 0 %
Lymphocytes Relative: 24 %
Lymphs Abs: 3.8 10*3/uL (ref 0.7–4.0)
MCH: 30.3 pg (ref 26.0–34.0)
MCHC: 34.1 g/dL (ref 30.0–36.0)
MCV: 89 fL (ref 80.0–100.0)
Monocytes Absolute: 0.8 10*3/uL (ref 0.1–1.0)
Monocytes Relative: 5 %
Neutro Abs: 10.2 10*3/uL — ABNORMAL HIGH (ref 1.7–7.7)
Neutrophils Relative %: 66 %
Platelets: 456 10*3/uL — ABNORMAL HIGH (ref 150–400)
RBC: 4.62 MIL/uL (ref 4.22–5.81)
RDW: 12.9 % (ref 11.5–15.5)
WBC: 15.6 10*3/uL — ABNORMAL HIGH (ref 4.0–10.5)
nRBC: 0 % (ref 0.0–0.2)

## 2022-07-30 LAB — LIPASE, BLOOD: Lipase: 35 U/L (ref 11–51)

## 2022-07-30 MED ORDER — MORPHINE SULFATE (PF) 4 MG/ML IV SOLN
4.0000 mg | Freq: Once | INTRAVENOUS | Status: AC
  Administered 2022-07-30: 4 mg via INTRAVENOUS
  Filled 2022-07-30: qty 1

## 2022-07-30 MED ORDER — SODIUM CHLORIDE 0.9 % IV BOLUS
1000.0000 mL | Freq: Once | INTRAVENOUS | Status: AC
  Administered 2022-07-30: 1000 mL via INTRAVENOUS

## 2022-07-30 MED ORDER — CEPHALEXIN 500 MG PO CAPS: 500.0000 mg | ORAL_CAPSULE | Freq: Four times a day (QID) | ORAL | 0 refills | Status: DC

## 2022-07-30 MED ORDER — SODIUM CHLORIDE 0.9 % IV SOLN
1.0000 g | INTRAVENOUS | Status: DC
  Administered 2022-07-30: 1 g via INTRAVENOUS
  Filled 2022-07-30: qty 10

## 2022-07-30 MED ORDER — FENTANYL CITRATE PF 50 MCG/ML IJ SOSY
50.0000 ug | PREFILLED_SYRINGE | Freq: Once | INTRAMUSCULAR | Status: AC
  Administered 2022-07-30: 50 ug via INTRAVENOUS
  Filled 2022-07-30: qty 1

## 2022-07-30 MED ORDER — IOHEXOL 300 MG/ML  SOLN
100.0000 mL | Freq: Once | INTRAMUSCULAR | Status: AC | PRN
  Administered 2022-07-30: 100 mL via INTRAVENOUS

## 2022-07-30 MED ORDER — ONDANSETRON 4 MG PO TBDP
4.0000 mg | ORAL_TABLET | Freq: Once | ORAL | Status: AC
  Administered 2022-07-30: 4 mg via ORAL
  Filled 2022-07-30: qty 1

## 2022-07-30 NOTE — Discharge Instructions (Signed)
Please take the antibiotics as prescribed.  Please follow-up with Dr. Diamantina Providence with urology, he will call you to arrange follow-up.  Please return for any new, worsening, or change in symptoms or other concerns.  It was a pleasure caring for you today.

## 2022-07-30 NOTE — ED Provider Notes (Signed)
[facility] Provider Note    Event Date/Time   First MD Initiated Contact with Patient 07/30/22 1401     (approximate)   History   Abdominal Pain (Patient reports generalized abdominal pain that began 2 days ago; History of diverticulitis and states that the pain feels similiar)   HPI  Charles Beasley is a 46 y.o. male with a past medical history of colitis, diabetes who presents today for evaluation of abdominal pain.  Patient reports that this has been ongoing for approximately 4 days.  He reports that his pain is across his lower abdomen and radiates into his back.  He has also had some burning with urination.  No fevers or chills.  He is nauseated but has not had any vomiting.  He reports that he has been having loose stools.  He denies any blood in his stool.  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/29/2018   Aortic atherosclerosis ([place]) 03/09/2017   Calculus of kidney 11/28/2016   Diet-controlled type 2 diabetes mellitus ([place]) 06/21/2016   Tinnitus 03/12/2014   Carpal tunnel syndrome of right wrist 03/12/2014   Depression 02/11/2014   Allergic rhinitis 04/21/2013   Gastroesophageal reflux disease without esophagitis 02/21/2009   IBS (irritable bowel syndrome) 07/31/1989          Physical Exam   Triage Vital Signs: ED Triage Vitals  Enc Vitals Group     BP 07/30/22 1328 (!) 142/95     Pulse Rate 07/30/22 1328 99     Resp 07/30/22 1328 20     Temp 07/30/22  1328 98.2 F (36.8 C)     Temp Source 07/30/22 1328 Oral     SpO2 07/30/22 1328 96 %     Weight 07/30/22 1329 154 lb (69.9 kg)     Height 07/30/22 1329 '5\' 8"'$  (1.727 m)     Head Circumference --      Peak Flow --      Pain Score 07/30/22 1329 9     Pain Loc --      Pain Edu? --      Excl. in [place]? --     Most recent vital signs: Vitals:   07/30/22 1328 07/30/22 1559  BP: (!) 142/95 (!) 140/94  Pulse: 99 96  Resp: 20 20  Temp: 98.2 F (36.8 C) 98.2 F (36.8 C)  SpO2: 96% 96%    Physical Exam Vitals and nursing note reviewed.  Constitutional:      General: Awake and alert. No acute distress.    Appearance: Normal appearance. The patient is normal weight.  HENT:     Head: Normocephalic and atraumatic.     Mouth: Mucous membranes are moist.  Eyes:     General: PERRL. Normal EOMs        Right eye: No discharge.        Left eye: No discharge.     Conjunctiva/sclera: Conjunctivae normal.  Cardiovascular:     Rate  and Rhythm: Normal rate and regular rhythm.     Pulses: Normal pulses.  Pulmonary:     Effort: Pulmonary effort is normal. No respiratory distress.     Breath sounds: Normal breath sounds.  Abdominal:     Abdomen is soft. There is left lower quadrant and right lower quadrant abdominal tenderness. No rebound or guarding. No distention.  Mild left-sided CVA tenderness Musculoskeletal:        General: No swelling. Normal range of motion.     Cervical back: Normal range of motion and neck supple.  Skin:    General: Skin is warm and dry.     Capillary Refill: Capillary refill takes less than 2 seconds.     Findings: No rash.  Neurological:     Mental Status: The patient is awake and alert.      ED Results / Procedures / Treatments   Labs (all labs ordered are listed, but only abnormal results are displayed) Labs Reviewed  COMPREHENSIVE METABOLIC PANEL - Abnormal; Notable for the following components:      Result Value   CO2 18 (*)    Glucose, Bld 116 (*)     All other components within normal limits  CBC WITH DIFFERENTIAL/PLATELET - Abnormal; Notable for the following components:   WBC 15.6 (*)    Platelets 456 (*)    Neutro Abs 10.2 (*)    Eosinophils Absolute 0.6 (*)    All other components within normal limits  URINALYSIS, ROUTINE W REFLEX MICROSCOPIC - Abnormal; Notable for the following components:   Color, Urine YELLOW (*)    APPearance HAZY (*)    Hgb urine dipstick LARGE (*)    Protein, ur 100 (*)    Leukocytes,Ua MODERATE (*)    RBC / HPF >50 (*)    WBC, UA >50 (*)    All other components within normal limits  LIPASE, BLOOD     EKG     RADIOLOGY I independently reviewed and interpreted imaging and agree with radiologists findings.     PROCEDURES:  Critical Care performed:   Procedures   MEDICATIONS ORDERED IN ED: Medications  cefTRIAXone (ROCEPHIN) 1 g in sodium chloride 0.9 % 100 mL IVPB (0 g Intravenous Stopped 07/30/22 1559)  ondansetron (ZOFRAN-ODT) disintegrating tablet 4 mg (4 mg Oral Given 07/30/22 1337)  morphine (PF) 4 MG/ML injection 4 mg (4 mg Intravenous Given 07/30/22 1425)  iohexol (OMNIPAQUE) 300 MG/ML solution 100 mL (100 mLs Intravenous Contrast Given 07/30/22 1441)  fentaNYL (SUBLIMAZE) injection 50 mcg (50 mcg Intravenous Given 07/30/22 1514)  sodium chloride 0.9 % bolus 1,000 mL (0 mLs Intravenous Stopped 07/30/22 1559)     IMPRESSION / MDM / ASSESSMENT AND PLAN / ED COURSE  I reviewed the triage vital signs and the nursing notes.   Differential diagnosis includes, but is not limited to, diverticulitis, perforation, abscess, nephrolithiasis, appendicitis, colitis.  Patient is awake and alert, hemodynamically stable and afebrile.  Labs obtained in triage are remarkable for a leukocytosis to 15.6.  Urinalysis also significant for greater than 50 RBCs and WBCs.  CT scan reveals prominence of the left renal collecting system with proximal ureter and urothelial hyperenhancement which may  reflect sequela of a recently passed stone.  Patient continued to have pain, and given his white blood cell count of 15 and the white blood cells in his urine with the 10 mm stone in the left renal pelvis, I discussed with Dr. Diamantina Providence with urology who recommends outpatient be discharged on  Keflex and see him in his clinic on Tuesday.  He reports that this degree of pain is not unusual given that there are likely to be ureteral spasms.  He requested that I send him the patient's information via secure chat which I did.  Patient agrees to follow-up on Tuesday.  We discussed strict return precautions and the importance of close outpatient follow-up.  Patient understands and agrees with plan.  He was discharged in stable condition.   Patient's presentation is most consistent with acute complicated illness / injury requiring diagnostic workup.   Clinical Course as of 07/30/22 1618  Sun Jul 30, 2022  1546 Discussed with Dr. Diamantina Providence who will arrange f/u in clinic on Tuesday [JP]    Clinical Course User Index [JP] Lanesha Azzaro, Clarnce Flock, PA-C     FINAL CLINICAL IMPRESSION(S) / ED DIAGNOSES   Final diagnoses:  Ureteral colic  Stone in renal pelvis     Rx / DC Orders   ED Discharge Orders          Ordered    cephALEXin (KEFLEX) 500 MG capsule  4 times daily        07/30/22 1555             Note:  This document was prepared using Dragon voice recognition software and may include unintentional dictation errors.   Marquette Old, PA-C 07/30/22 1618    Nathaniel Man, MD 08/10/22 1054

## 2022-07-30 NOTE — ED Triage Notes (Signed)
Patient reports generalized abdominal pain that began 2 days ago; History of diverticulitis and states that the pain feels similiar

## 2022-07-30 NOTE — ED Notes (Signed)
ED Provider at bedside. 

## 2022-07-31 ENCOUNTER — Other Ambulatory Visit: Payer: Self-pay

## 2022-07-31 ENCOUNTER — Emergency Department
Admission: EM | Admit: 2022-07-31 | Discharge: 2022-07-31 | Disposition: A | Discharge status: Home / Self Care | Payer: BC Managed Care – PPO | Attending: Emergency Medicine | Admitting: Emergency Medicine

## 2022-07-31 ENCOUNTER — Encounter: Payer: Self-pay | Admitting: Emergency Medicine

## 2022-07-31 ENCOUNTER — Emergency Department: Payer: BC Managed Care – PPO

## 2022-07-31 DIAGNOSIS — N201 Calculus of ureter: Secondary | ICD-10-CM | POA: Diagnosis not present

## 2022-07-31 DIAGNOSIS — R739 Hyperglycemia, unspecified: Secondary | ICD-10-CM | POA: Insufficient documentation

## 2022-07-31 DIAGNOSIS — D72829 Elevated white blood cell count, unspecified: Secondary | ICD-10-CM | POA: Insufficient documentation

## 2022-07-31 DIAGNOSIS — R109 Unspecified abdominal pain: Secondary | ICD-10-CM

## 2022-07-31 DIAGNOSIS — N23 Unspecified renal colic: Secondary | ICD-10-CM

## 2022-07-31 LAB — URINALYSIS, ROUTINE W REFLEX MICROSCOPIC
Bacteria, UA: NONE SEEN
Bilirubin Urine: NEGATIVE
Glucose, UA: 50 mg/dL — AB
Ketones, ur: NEGATIVE mg/dL
Nitrite: NEGATIVE
Protein, ur: 100 mg/dL — AB
Specific Gravity, Urine: 1.021 (ref 1.005–1.030)
Squamous Epithelial / HPF: NONE SEEN /HPF (ref 0–5)
pH: 5 (ref 5.0–8.0)

## 2022-07-31 LAB — CBC
HCT: 38.5 % — ABNORMAL LOW (ref 39.0–52.0)
Hemoglobin: 12.9 g/dL — ABNORMAL LOW (ref 13.0–17.0)
MCH: 30.2 pg (ref 26.0–34.0)
MCHC: 33.5 g/dL (ref 30.0–36.0)
MCV: 90.2 fL (ref 80.0–100.0)
Platelets: 455 10*3/uL — ABNORMAL HIGH (ref 150–400)
RBC: 4.27 MIL/uL (ref 4.22–5.81)
RDW: 13 % (ref 11.5–15.5)
WBC: 14.8 10*3/uL — ABNORMAL HIGH (ref 4.0–10.5)
nRBC: 0 % (ref 0.0–0.2)

## 2022-07-31 LAB — COMPREHENSIVE METABOLIC PANEL
ALT: 13 U/L (ref 0–44)
AST: 20 U/L (ref 15–41)
Albumin: 4 g/dL (ref 3.5–5.0)
Alkaline Phosphatase: 62 U/L (ref 38–126)
Anion gap: 7 (ref 5–15)
BUN: 9 mg/dL (ref 6–20)
CO2: 22 mmol/L (ref 22–32)
Calcium: 9.7 mg/dL (ref 8.9–10.3)
Chloride: 107 mmol/L (ref 98–111)
Creatinine, Ser: 0.91 mg/dL (ref 0.61–1.24)
GFR, Estimated: >60 mL/min (ref >60)
Glucose, Bld: 258 mg/dL — ABNORMAL HIGH (ref 70–99)
Potassium: 4 mmol/L (ref 3.5–5.1)
Sodium: 136 mmol/L (ref 135–145)
Total Bilirubin: 0.4 mg/dL (ref 0.3–1.2)
Total Protein: 7.3 g/dL (ref 6.5–8.1)

## 2022-07-31 LAB — LIPASE, BLOOD: Lipase: 37 U/L (ref 11–51)

## 2022-07-31 MED ORDER — MORPHINE SULFATE (PF) 4 MG/ML IV SOLN
4.0000 mg | Freq: Once | INTRAVENOUS | Status: AC
  Administered 2022-07-31: 4 mg via INTRAVENOUS
  Filled 2022-07-31: qty 1

## 2022-07-31 MED ORDER — ONDANSETRON 4 MG PO TBDP: 4.0000 mg | ORAL_TABLET | Freq: Three times a day (TID) | ORAL | 0 refills | Status: DC | PRN

## 2022-07-31 MED ORDER — LACTATED RINGERS IV BOLUS
1000.0000 mL | Freq: Once | INTRAVENOUS | Status: AC
  Administered 2022-07-31: 1000 mL via INTRAVENOUS

## 2022-07-31 MED ORDER — ACETAMINOPHEN 500 MG PO TABS
1000.0000 mg | ORAL_TABLET | Freq: Once | ORAL | Status: AC
  Administered 2022-07-31: 1000 mg via ORAL
  Filled 2022-07-31: qty 2

## 2022-07-31 MED ORDER — OXYCODONE HCL 5 MG PO TABS
5.0000 mg | ORAL_TABLET | Freq: Once | ORAL | Status: AC
  Administered 2022-07-31: 5 mg via ORAL
  Filled 2022-07-31: qty 1

## 2022-07-31 MED ORDER — ONDANSETRON 4 MG PO TBDP
4.0000 mg | ORAL_TABLET | Freq: Once | ORAL | Status: AC | PRN
  Administered 2022-07-31: 4 mg via ORAL
  Filled 2022-07-31: qty 1

## 2022-07-31 MED ORDER — OXYCODONE HCL 5 MG PO TABS: 5.0000 mg | ORAL_TABLET | Freq: Three times a day (TID) | ORAL | 0 refills | Status: DC | PRN

## 2022-07-31 MED ORDER — KETOROLAC TROMETHAMINE 30 MG/ML IJ SOLN
15.0000 mg | Freq: Once | INTRAMUSCULAR | Status: AC
  Administered 2022-07-31: 15 mg via INTRAVENOUS
  Filled 2022-07-31: qty 1

## 2022-07-31 MED ORDER — OXYCODONE-ACETAMINOPHEN 5-325 MG PO TABS
1.0000 | ORAL_TABLET | ORAL | Status: DC | PRN
  Administered 2022-07-31: 1 via ORAL
  Filled 2022-07-31: qty 1

## 2022-07-31 NOTE — Discharge Instructions (Signed)
Please make sure to follow-up with the urologist in the clinic tomorrow.  They should be able to see you on Tuesday  Please pick up the antibiotic prescription that was sent to the pharmacy during your last visit, the Keflex antibiotic, start taking this as soon as possible.  Use Tylenol for pain and fevers.  Up to 1000 mg per dose, up to 4 times per day.  Do not take more than 4000 mg of Tylenol/acetaminophen within 24 hours..  I also prescribed Zofran nausea medicine to use as needed, as well as oxycodone pain medicine for more severe/breakthrough pain.

## 2022-07-31 NOTE — ED Triage Notes (Signed)
Pt arrived via POV with c/o LLQ abd pain, pt states the pain started about 3-4 days ago, pt states he was seen here yesterday for the same, had CTAP showed recent passed stone and diverticulosis.   Pt c/o nausea.  Pt has not been able to get prescription filled yet.

## 2022-07-31 NOTE — ED Provider Notes (Signed)
[facility] Provider Note    Event Date/Time   First MD Initiated Contact with Patient 07/31/22 [phone]     (approximate)   History   Abdominal Pain   HPI  Charles Beasley is a 47 y.o. male who presents to the ED for evaluation of Abdominal Pain   I reviewed ED visit from yesterday afternoon and patient was evaluated for lower abdominal pain radiating towards his back.  Had a contrasted CT scan of abdomen/pelvis that demonstrated prominent left ureteral collecting system, urothelial enhancement, and a 10 mm stone within the renal pelvis.  Suspected recently passed ureteral stone, possible acute uncomplicated diverticulitis, possible cystitis.  The case was discussed with urology on-call who recommended starting antibiotics such as Keflex and they can arrange quick follow-up in the clinic for Tuesday (tomorrow).  Patient returns the ED for evaluation of worsening left flank pain.  Reports his dysuria is improving compared to yesterday afternoon he was seen about 12 hours ago.  Denies any diarrhea, hematochezia, emesis, fevers.  Just reporting poorly controlled pain.   Physical Exam   Triage Vital Signs: ED Triage Vitals  Enc Vitals Group     BP 07/31/22 0034 137/82     Pulse Rate 07/31/22 0034 87     Resp 07/31/22 0034 17     Temp 07/31/22 0034 97.9 F (36.6 C)     Temp Source 07/31/22 0034 Oral     SpO2 07/31/22 0034 98 %     Weight --      Height --      Head Circumference --      Peak Flow --      Pain Score 07/31/22 0049 9     Pain Loc --      Pain Edu? --      Excl. in [place]? --     Most recent vital signs: Vitals:   07/31/22 0034 07/31/22 0458  BP: 137/82 118/83  Pulse: 87 80  Resp: 17 18  Temp: 97.9 F (36.6 C) 97.6 F (36.4 C)  SpO2: 98% 95%    General: Awake, no distress.  Seems uncomfortable. CV:  Good peripheral perfusion.  Resp:  Normal effort.  Abd:  No distention.  Left CVA tenderness and left-sided abdominal/flank tenderness is  poorly localizing and mild.  No peritoneal features.  Abdomen anteriorly is otherwise benign MSK:  No deformity noted.  Neuro:  No focal deficits appreciated. Other:     ED Results / Procedures / Treatments   Labs (all labs ordered are listed, but only abnormal results are displayed) Labs Reviewed  COMPREHENSIVE METABOLIC PANEL - Abnormal; Notable for the following components:      Result Value   Glucose, Bld 258 (*)    All other components within normal limits  CBC - Abnormal; Notable for the following components:   WBC 14.8 (*)    Hemoglobin 12.9 (*)    HCT 38.5 (*)    Platelets 455 (*)    All other components within normal limits  URINALYSIS, ROUTINE W REFLEX MICROSCOPIC - Abnormal; Notable for the following components:   Color, Urine YELLOW (*)    APPearance HAZY (*)    Glucose, UA 50 (*)    Hgb urine dipstick MODERATE (*)    Protein, ur 100 (*)    Leukocytes,Ua TRACE (*)    All other components within normal limits  LIPASE, BLOOD    EKG   RADIOLOGY Repeat CT interpreted by me with persistent left  renal pelvis stone  Official radiology report(s): CT Renal Stone Study  Result Date: 07/31/2022 CLINICAL DATA:  Worsening left flank pain. EXAM: CT ABDOMEN AND PELVIS WITHOUT CONTRAST TECHNIQUE: Multidetector CT imaging of the abdomen and pelvis was performed following the standard protocol without IV contrast. RADIATION DOSE REDUCTION: This exam was performed according to the departmental dose-optimization program which includes automated exposure control, adjustment of the mA and/or kV according to patient size and/or use of iterative reconstruction technique. COMPARISON:  July 30, 2022 FINDINGS: Lower chest: No acute abnormality. Hepatobiliary: No focal liver abnormality is seen. The gallbladder is contracted without evidence of gallstones, gallbladder wall thickening, or biliary dilatation. Pancreas: Unremarkable. No pancreatic ductal dilatation or surrounding  inflammatory changes. Spleen: Normal in size without focal abnormality. Adrenals/Urinary Tract: Adrenal glands are unremarkable. Kidneys are normal in size without focal lesions. An 8 mm renal calculus is again seen within the left renal pelvis. This is unchanged in position when compared to the prior study. Mild prominence of the left renal collecting system is seen. A 3 mm nonobstructing renal calculus is seen within the lower pole of the left kidney. Diluted contrast is seen within the lumen of an otherwise normal appearing urinary bladder. Stomach/Bowel: Stomach is within normal limits. Appendix appears normal. No evidence of bowel dilatation. Noninflamed diverticula are seen within the proximal and mid sigmoid colon. Vascular/Lymphatic: Aortic atherosclerosis. No enlarged abdominal or pelvic lymph nodes. Reproductive: Prostate is unremarkable. Other: No abdominal wall hernia or abnormality. No abdominopelvic ascites. Musculoskeletal: No acute or significant osseous findings. IMPRESSION: 1. 8 mm renal calculus within the left renal pelvis, unchanged in position when compared to the prior study. 2. 3 mm nonobstructing left renal calculus. 3. Sigmoid diverticulosis. 4. Aortic atherosclerosis. Aortic Atherosclerosis (ICD10-I70.0). Electronically Signed   By: Virgina Norfolk M.D.   On: 07/31/2022 04:54   CT Abdomen Pelvis W Contrast  Result Date: 07/30/2022 CLINICAL DATA:  Generalized abdominal pain x2 days. History of diverticulitis. EXAM: CT ABDOMEN AND PELVIS WITH CONTRAST TECHNIQUE: Multidetector CT imaging of the abdomen and pelvis was performed using the standard protocol following bolus administration of intravenous contrast. RADIATION DOSE REDUCTION: This exam was performed according to the departmental dose-optimization program which includes automated exposure control, adjustment of the mA and/or kV according to patient size and/or use of iterative reconstruction technique. CONTRAST:  179m  OMNIPAQUE IOHEXOL 300 MG/ML  SOLN COMPARISON:  CT May 25, 2021 FINDINGS: Lower chest: No acute abnormality. Hepatobiliary: No suspicious hepatic lesion. Gallbladder is unremarkable. No biliary ductal dilation. Pancreas: No pancreatic ductal dilation or evidence of acute inflammation. Spleen: No splenomegaly or focal splenic lesion. Adrenals/Urinary Tract: Bilateral adrenal glands appear normal. Prominence of the left collecting system and proximal ureter with urothelial hyperenhancement and a 10 mm stone in the renal pelvis. Normal right renal collecting system. Kidneys demonstrate symmetric enhancement. Urinary bladder is unremarkable for degree of distension. Stomach/Bowel: Stomach is unremarkable for degree of distension. No pathologic dilation of small or large bowel. Normal appendix. Colonic diverticulosis with mild wall thickening of a short segment of sigmoid colon. Vascular/Lymphatic: Aortic atherosclerosis. No pathologically enlarged abdominal or pelvic lymph nodes. Reproductive: Prostate is unremarkable. Other: No significant abdominopelvic free fluid. Musculoskeletal: No acute osseous abnormality. IMPRESSION: 1. Prominence of the left collecting system and proximal ureter with urothelial hyperenhancement and a 10 mm stone in the left renal pelvis. Findings which are nonspecific may reflect sequela of recently passed stone, reactive inflammation related to the large stone in the collecting system versus ascending  urinary tract infection. Suggest correlation with laboratory values. 2. Colonic diverticulosis with mild wall thickening of a short segment of sigmoid colon, which may reflect early/mild acute uncomplicated diverticulitis/colitis. 3.  Aortic Atherosclerosis (ICD10-I70.0). Electronically Signed   By: Dahlia Bailiff M.D.   On: 07/30/2022 15:25    PROCEDURES and INTERVENTIONS:  Procedures  Medications  oxyCODONE-acetaminophen (PERCOCET/ROXICET) 5-325 MG per tablet 1 tablet (1 tablet Oral  Given 07/31/22 0348)  acetaminophen (TYLENOL) tablet 1,000 mg (has no administration in time range)  ketorolac (TORADOL) 30 MG/ML injection 15 mg (has no administration in time range)  oxyCODONE (Oxy IR/ROXICODONE) immediate release tablet 5 mg (has no administration in time range)  ondansetron (ZOFRAN-ODT) disintegrating tablet 4 mg (4 mg Oral Given 07/31/22 0053)  lactated ringers bolus 1,000 mL (1,000 mLs Intravenous New Bag/Given 07/31/22 0454)  morphine (PF) 4 MG/ML injection 4 mg (4 mg Intravenous Given 07/31/22 0452)     IMPRESSION / MDM / ASSESSMENT AND PLAN / ED COURSE  I reviewed the triage vital signs and the nursing notes.  Differential diagnosis includes, but is not limited to, pyelonephritis, sepsis, ureteral rupture, newly obstructing stone, shingles  {Patient presents with symptoms of an acute illness or injury that is potentially life-threatening.  47 year old male presents with acutely worsening left flank pain after recently being seen for similar complaints, without evidence of acute pathology and suitable for continued outpatient management with close urologic follow-up.  No signs of fever, sepsis or worsening objective picture on his workup.  Persistent leukocytosis similar to 12 hours ago.  Normal lipase.  Hyperglycemia is noted without acidosis, signs of DKA or renal dysfunction.  There is acutely worsening pain, after discussing the risks and benefits, we agreed to go ahead and repeat a CT scan to make sure nothing is newly obstructive considering his worsening pain and imaging does not demonstrate any progression or worsening.  His pain is controlled, is tolerating p.o. intake and I think he be suitable for outpatient management.  The PA who saw him yesterday coordinated with urology to arrange follow-up tomorrow, which I think is quite reasonable.  I urged him to continue his Keflex, I provided prescriptions for antiemetics and analgesia.  We discussed appropriate return  precautions.  Clinical Course as of 07/31/22 0605  Mon Jul 31, 2022  [id number] Reassessed and discussed CT results.  His pain is controlled.  He is Patent attorney.  We discussed trying to transition to p.o. medication's, p.o. challenge and hopefully outpatient management. [DS]    Clinical Course User Index [DS] Vladimir Crofts, MD     FINAL CLINICAL IMPRESSION(S) / ED DIAGNOSES   Final diagnoses:  Left flank pain  Ureteral colic     Rx / DC Orders   ED Discharge Orders          Ordered    oxyCODONE (ROXICODONE) 5 MG immediate release tablet  Every 8 hours PRN        07/31/22 0602    ondansetron (ZOFRAN-ODT) 4 MG disintegrating tablet  Every 8 hours PRN        07/31/22 0602             Note:  This document was prepared using Dragon voice recognition software and may include unintentional dictation errors.   Vladimir Crofts, MD 07/31/22 [phone]

## 2022-08-01 ENCOUNTER — Telehealth: Payer: Self-pay

## 2022-08-01 ENCOUNTER — Ambulatory Visit
Admission: RE | Admit: 2022-08-01 | Discharge: 2022-08-01 | Disposition: A | Discharge status: Home / Self Care | Payer: BC Managed Care – PPO | Source: Ambulatory Visit | Attending: Urology | Admitting: Urology

## 2022-08-01 ENCOUNTER — Other Ambulatory Visit: Payer: Self-pay | Admitting: Urology

## 2022-08-01 ENCOUNTER — Other Ambulatory Visit: Payer: Self-pay

## 2022-08-01 ENCOUNTER — Ambulatory Visit
Admission: RE | Admit: 2022-08-01 | Discharge: 2022-08-01 | Disposition: A | Discharge status: Home / Self Care | Payer: BC Managed Care – PPO | Attending: Urology | Admitting: Urology

## 2022-08-01 ENCOUNTER — Ambulatory Visit (INDEPENDENT_AMBULATORY_CARE_PROVIDER_SITE_OTHER): Payer: BC Managed Care – PPO | Admitting: Urology

## 2022-08-01 VITALS — BP 126/80 | HR 64 | Ht 69.0 in | Wt 153.8 lb

## 2022-08-01 DIAGNOSIS — N201 Calculus of ureter: Secondary | ICD-10-CM

## 2022-08-01 DIAGNOSIS — N200 Calculus of kidney: Secondary | ICD-10-CM | POA: Diagnosis present

## 2022-08-01 NOTE — Telephone Encounter (Signed)
-----   Message from Billey Co, MD sent at 07/30/2022  4:51 PM EST ----- Regarding: kidney stone patient Patient with kidney stone, please offer him appt with me 1/2 Tuesday AM in [place], thanks. KUB prior, no UA.  Nickolas Madrid, MD 07/30/2022

## 2022-08-01 NOTE — H&P (View-Only) (Signed)
   08/01/2022 1:33 PM   Charles Beasley [birth year] [id number]  Reason for visit: Left flank pain  HPI: 47 year old male with history of kidney stones who has had multiple ER visits over the last few days for left-sided flank pain.  He has had 2 CT scans, both showing an 8 mm renal pelvis stone with likely intermittent obstruction as the etiology of his left-sided renal colic.  He denies any fevers or chills.  Urinalysis showed pyuria and microscopic hematuria, but no bacteria.  This was not sent for culture.  He was started on Keflex and discharged with pain medication.  Unfortunately with the New Year's holiday the pharmacy was not open and he has not been able to pick up any pain medications, which resulted in his second ER visit.  He continues to have left-sided flank pain.  His last dose of ibuprofen was 8 PM on 07/31/2022.  He previously underwent ureteroscopy at [facility] in the past, and did not tolerate his ureteral stent well at all.  He is interested in shockwave lithotripsy.  I personally reviewed both CT scans dated 07/30/2022 and 07/31/2022 showing a 8 mm left renal pelvis stone with likely some intermittent obstruction, 1200HU, 9cm SSD.  Joaquim Lai is clearly seen on KUB today.  We discussed various treatment options for urolithiasis including observation with or without medical expulsive therapy, shockwave lithotripsy (SWL), or ureteroscopy and laser lithotripsy with stent placement.  We discussed that management is based on stone size, location, density, patient co-morbidities, and patient preference.   Stones <57m in size have a >80% spontaneous passage rate. Data surrounding the use of tamsulosin for medical expulsive therapy is controversial, but meta analyses suggests it is most efficacious for distal stones between 5-174min size. Possible side effects include dizziness/lightheadedness, and retrograde ejaculation.SWL has a lower stone free rate in a single procedure, but also a lower  complication rate compared to ureteroscopy and avoids a stent and associated stent related symptoms. Possible complications include renal hematoma, steinstrasse, and need for additional treatment.Ureteroscopy with laser lithotripsy and stent placement has a higher stone free rate than SWL in a single procedure, however increased complication rate including possible infection, ureteral injury, bleeding, and stent related morbidity. Common stent related symptoms include dysuria, urgency/frequency, and flank pain.  After an extensive discussion of the risks and benefits of the above treatment options, the patient would like to proceed with left shockwave lithotripsy this week.   BrBilley CoMD[facility]utton[facility] [address]SuNew HanoveruNew [place] [phone]

## 2022-08-01 NOTE — Patient Instructions (Addendum)
Do not take aspirin, Aleve, naproxen, ibuprofen, or Motrin, or any NSAID(anti-inflammatory), as this will cause your surgery to be canceled on Thursday.  ESWL for Kidney Stones  Extracorporeal shock wave lithotripsy (ESWL) is a treatment that can help break up kidney stones that are too large to pass on their own.  This is a nonsurgical procedure that breaks up a kidney stone with shock waves. These shock waves pass through your body and focus on the kidney stone. They cause the kidney stone to break into smaller pieces (fragments) while it is still in the urinary tract. The fragments of stone can pass more easily out of your body in the urine. Tell a health care provider about: Any allergies you have. All medicines you are taking, including vitamins, herbs, eye drops, creams, and over-the-counter medicines. Any problems you or family members have had with anesthetic medicines. Any bleeding problems you have. Any surgeries you have had. Any medical conditions you have. Whether you are pregnant or may be pregnant. What are the risks? Your health care provider will talk with you about risks. These may include: Infection. Bleeding from the kidney. Bruising of the kidney or skin. Scarring of the kidney. This can lead to: Increased blood pressure. Poor kidney function. Return (recurrence) of kidney stones. Damage to other structures or organs. This may include the liver, colon, spleen, or pancreas. Blockage (obstruction) of the tube that carries urine from the kidney to the bladder (ureter). Failure of the kidney stone to break into fragments. What happens before the procedure? When to stop eating and drinking Follow instructions from your health care provider about what you may eat and drink. These may include: 8 hours before your procedure Stop eating most foods. Do not eat meat, fried foods, or fatty foods. Eat only light foods, such as toast or crackers. All liquids are okay except  energy drinks and alcohol. 6 hours before your procedure Stop eating. Drink only clear liquids, such as water, clear fruit juice, black coffee, plain tea, and sports drinks. Do not drink energy drinks or alcohol. 2 hours before your procedure Stop drinking all liquids. You may be allowed to take medicines with small sips of water. If you do not follow your health care provider's instructions, your procedure may be delayed or canceled. Medicines Ask your health care provider about: Changing or stopping your regular medicines. These include any diabetes medicines or blood thinners you take. Taking medicines such as aspirin and ibuprofen. These medicines can thin your blood. Do not take them unless your health care provider tells you to. Taking over-the-counter medicines, vitamins, herbs, and supplements. Tests You may have tests, such as: Blood tests. Urine tests. Imaging tests. This may include a CT scan. Surgery safety Ask your health care provider: How your surgery site will be marked. What steps will be taken to help prevent infection. These steps may include: Washing skin with a soap that kills germs. Receiving antibiotics. General instructions If you will be going home right after the procedure, plan to have a responsible adult: Take you home from the hospital or clinic. You will not be allowed to drive. Care for you for the time you are told. What happens during the procedure?  An IV will be inserted into one of your veins. You may be given: A sedative. This helps you relax. Anesthesia. This will: Numb certain areas of your body. Make you fall asleep for surgery. A water-filled cushion may be placed behind your kidney or on your abdomen.  In some cases, you may be placed in a tub of lukewarm water. Your body will be positioned in a way that makes it easier to target the kidney stone. An X-ray or ultrasound exam will be done to locate your stone. Shock waves will be aimed  at the stone. If you are awake, you may feel a tapping sensation as the shock waves pass through your body. A small mesh tube (stent) may be placed in your ureter. This will help keep urine flowing from the kidney if the fragments of the stone have been blocking the ureter. The stent will be removed at a later time by your health care provider. The procedure may vary among health care providers and hospitals. What happens after the procedure? Your blood pressure, heart rate, breathing rate, and blood oxygen level will be monitored until you leave the hospital or clinic. You may have an X-ray after the procedure to see how many of the kidney stones were broken up. This will also show how much of the stone has passed. If there are still large fragments after treatment, you may need to have a second procedure at a later time. This information is not intended to replace advice given to you by your health care provider. Make sure you discuss any questions you have with your health care provider. Document Revised: 11/17/2021 Document Reviewed: 11/17/2021 Elsevier Patient Education  [place].  ESWL for Kidney Stones, Care After The following information offers guidance on how to care for yourself after your procedure. Your health care provider may also give you more specific instructions. If you have problems or questions, contact your health care provider. What can I expect after the procedure? After the procedure, it is common to have: Some blood in your urine. This should only last for a few days. Soreness in your back, sides, or upper abdomen for a few days. Blotches or bruises on the area where the shock wave entered the skin. Pain, discomfort, or nausea when pieces (fragments) of the kidney stone move through the tube that carries urine from the kidney to the bladder (ureter). Fragments may pass soon after the procedure. They may also take up to 4-8 weeks to pass. If you have severe pain  or nausea, contact your health care provider. This may be caused by a large stone that was not broken up enough. This may mean that you need more treatment. Some pain or discomfort during urination. Some pain or discomfort in the lower abdomen or at the base of the penis. Follow these instructions at home: Medicines  Take over-the-counter and prescription medicines only as told by your health care provider. If you were prescribed antibiotics, take them as told by your health care provider. Do not stop using the antibiotic even if you start to feel better. Ask your health care provider if the medicine prescribed to you: Requires you to avoid driving or using machinery. Can cause constipation. You may need to take these actions to prevent or treat constipation: Take over-the-counter or prescription medicines. Eat foods that are high in fiber, such as beans, whole grains, and fresh fruits and vegetables. Limit foods that are high in fat and processed sugars, such as fried or sweet foods. Eating and drinking  Follow instructions from your health care provider about what you may eat and drink. You may be told to: Reduce how much salt (sodium) you eat or drink. Check ingredients and nutrition facts on packaged foods and drinks to see how  much sodium they contain. Reduce how much meat you eat. Drink enough fluid to keep your urine pale yellow. This can help you pass any pieces of the stone that are left. It can also prevent new stones from forming. Eat plenty of fresh fruits and vegetables. Eat the recommended amount of calcium for your age and gender. Ask your health care provider how much calcium you should have. Activity Get plenty of rest as told by your health care provider. Avoid sitting for a long time without moving. Get up to take short walks every 1-2 hours. This is important to improve blood flow and breathing. Ask for help if you feel weak or unsteady. Your health care provider may tell  you to lie in a certain position (postural drainage) and tap firmly (percuss) over your kidney area to help stone fragments pass. Follow instructions as told by your health care provider. Return to your normal activities as told by your health care provider. Ask your health care provider what activities are safe for you. Most people can resume normal activities 1-2 days after the procedure. General instructions If told, strain all urine through the strainer that was provided by your health care provider. Keep all fragments for your health care provider to see. Any stones that are found may be sent to a medical lab for examination. The stone may be as small as a grain of salt. Keep all follow-up visits. This is important if you had a stent placed because it may need to stay in place for a few weeks. Ask your health care provider when the stent will be removed. Contact a health care provider if: You have a fever or chills. You have severe nausea that leads to persistent vomiting. You have any of these urinary symptoms: Increased blood or blood clots in the urine. Urine that smells bad or unusual. A strong urge to urinate after emptying your bladder. Pain or burning with urination that does not go away. A continued need to urinate more often than usual. You have a stent, and it comes out. Get help right away if: You have severe pain in your back, sides, or upper abdomen. You faint. You have any of these urinary symptoms: Severe pain while urinating. More blood in your urine, or blood in your urine when you did not have any before. Blood clots in your urine larger than 1 inch (2.5 cm) in size. You pass only a small amount of urine when you urinate or are unable to pass any urine. This information is not intended to replace advice given to you by your health care provider. Make sure you discuss any questions you have with your health care provider. Document Revised: 11/17/2021 Document Reviewed:  11/17/2021 Elsevier Patient Education  [place].

## 2022-08-01 NOTE — Progress Notes (Unsigned)
ESWL ORDER FORM  Expected date of procedure: Thursday 1/4  Surgeon:  MD on truck  Post op standing: 2-4wk follow up w/KUB prior  Anticoagulation/Aspirin/NSAID standing order: Hold all 72 hours prior  Anesthesia standing order: MAC  VTE standing: SCD's  Dx: Left Ureteral Stone  Procedure: left Extracorporeal shock wave lithotripsy  CPT : 93235  Standing Order Set:   *NPO after mn, KUB  *NS 150m/hr, Keflex 5068mPO, Benadryl 2531mO, Valium 61m63m, Zofran 4mg 67m   Medications if other than standing orders:   NONE

## 2022-08-01 NOTE — Telephone Encounter (Signed)
Called pt to offer stone f/u visit this morning. No answer. LM for pt to call back for appointment.

## 2022-08-01 NOTE — Progress Notes (Signed)
   08/01/2022 1:33 PM   Charles Beasley [birth year] [id number]  Reason for visit: Left flank pain  HPI: 47 year old male with history of kidney stones who has had multiple ER visits over the last few days for left-sided flank pain.  He has had 2 CT scans, both showing an 8 mm renal pelvis stone with likely intermittent obstruction as the etiology of his left-sided renal colic.  He denies any fevers or chills.  Urinalysis showed pyuria and microscopic hematuria, but no bacteria.  This was not sent for culture.  He was started on Keflex and discharged with pain medication.  Unfortunately with the New Year's holiday the pharmacy was not open and he has not been able to pick up any pain medications, which resulted in his second ER visit.  He continues to have left-sided flank pain.  His last dose of ibuprofen was 8 PM on 07/31/2022.  He previously underwent ureteroscopy at [facility] in the past, and did not tolerate his ureteral stent well at all.  He is interested in shockwave lithotripsy.  I personally reviewed both CT scans dated 07/30/2022 and 07/31/2022 showing a 8 mm left renal pelvis stone with likely some intermittent obstruction, 1200HU, 9cm SSD.  Joaquim Lai is clearly seen on KUB today.  We discussed various treatment options for urolithiasis including observation with or without medical expulsive therapy, shockwave lithotripsy (SWL), or ureteroscopy and laser lithotripsy with stent placement.  We discussed that management is based on stone size, location, density, patient co-morbidities, and patient preference.   Stones <48m in size have a >80% spontaneous passage rate. Data surrounding the use of tamsulosin for medical expulsive therapy is controversial, but meta analyses suggests it is most efficacious for distal stones between 5-154min size. Possible side effects include dizziness/lightheadedness, and retrograde ejaculation.SWL has a lower stone free rate in a single procedure, but also a lower  complication rate compared to ureteroscopy and avoids a stent and associated stent related symptoms. Possible complications include renal hematoma, steinstrasse, and need for additional treatment.Ureteroscopy with laser lithotripsy and stent placement has a higher stone free rate than SWL in a single procedure, however increased complication rate including possible infection, ureteral injury, bleeding, and stent related morbidity. Common stent related symptoms include dysuria, urgency/frequency, and flank pain.  After an extensive discussion of the risks and benefits of the above treatment options, the patient would like to proceed with left shockwave lithotripsy this week.   [facility] [address] [phone]

## 2022-08-02 MED ORDER — ONDANSETRON HCL 4 MG/2ML IJ SOLN: 4.0000 mg | Freq: Once | INTRAMUSCULAR | Status: AC

## 2022-08-02 MED ORDER — DIAZEPAM 5 MG PO TABS: 10.0000 mg | ORAL_TABLET | ORAL | Status: AC

## 2022-08-02 MED ORDER — SODIUM CHLORIDE 0.9 % IV SOLN: INTRAVENOUS | Status: DC

## 2022-08-02 MED ORDER — DIPHENHYDRAMINE HCL 25 MG PO CAPS: 25.0000 mg | ORAL_CAPSULE | ORAL | Status: AC

## 2022-08-02 MED ORDER — CEPHALEXIN 500 MG PO CAPS: 500.0000 mg | ORAL_CAPSULE | Freq: Once | ORAL | Status: AC

## 2022-08-03 ENCOUNTER — Ambulatory Visit
Admission: RE | Admit: 2022-08-03 | Discharge: 2022-08-03 | Disposition: A | Discharge status: Home / Self Care | Payer: BC Managed Care – PPO | Source: Ambulatory Visit | Attending: Urology | Admitting: Urology

## 2022-08-03 ENCOUNTER — Encounter: Payer: Self-pay | Admitting: Urology

## 2022-08-03 ENCOUNTER — Encounter
Admission: RE | Disposition: A | Discharge status: Home / Self Care | Payer: Self-pay | Source: Ambulatory Visit | Attending: Urology

## 2022-08-03 ENCOUNTER — Other Ambulatory Visit: Payer: Self-pay

## 2022-08-03 ENCOUNTER — Ambulatory Visit: Payer: BC Managed Care – PPO

## 2022-08-03 DIAGNOSIS — N200 Calculus of kidney: Secondary | ICD-10-CM | POA: Diagnosis present

## 2022-08-03 DIAGNOSIS — N201 Calculus of ureter: Secondary | ICD-10-CM

## 2022-08-03 HISTORY — PX: EXTRACORPOREAL SHOCK WAVE LITHOTRIPSY: SHX1557

## 2022-08-03 LAB — GLUCOSE, CAPILLARY: Glucose-Capillary: 210 mg/dL — ABNORMAL HIGH (ref 70–99)

## 2022-08-03 SURGERY — LITHOTRIPSY, ESWL
Anesthesia: Moderate Sedation | Laterality: Left

## 2022-08-03 MED ORDER — ONDANSETRON HCL 4 MG/2ML IJ SOLN
INTRAMUSCULAR | Status: AC
  Administered 2022-08-03: 4 mg via INTRAVENOUS
  Filled 2022-08-03: qty 2

## 2022-08-03 MED ORDER — TAMSULOSIN HCL 0.4 MG PO CAPS: 0.4000 mg | ORAL_CAPSULE | Freq: Every day | ORAL | 0 refills | Status: AC

## 2022-08-03 MED ORDER — OXYCODONE HCL 5 MG PO TABS: 5.0000 mg | ORAL_TABLET | Freq: Three times a day (TID) | ORAL | 0 refills | Status: AC | PRN

## 2022-08-03 MED ORDER — DIPHENHYDRAMINE HCL 25 MG PO CAPS
ORAL_CAPSULE | ORAL | Status: AC
  Administered 2022-08-03: 25 mg via ORAL
  Filled 2022-08-03: qty 1

## 2022-08-03 MED ORDER — DIAZEPAM 5 MG PO TABS
ORAL_TABLET | ORAL | Status: AC
  Administered 2022-08-03: 10 mg via ORAL
  Filled 2022-08-03: qty 2

## 2022-08-03 MED ORDER — CEPHALEXIN 500 MG PO CAPS
ORAL_CAPSULE | ORAL | Status: AC
  Administered 2022-08-03: 500 mg via ORAL
  Filled 2022-08-03: qty 1

## 2022-08-03 MED ORDER — LIDOCAINE HCL (PF) 1 % IJ SOLN
INTRAMUSCULAR | Status: AC
  Filled 2022-08-03: qty 5

## 2022-08-03 NOTE — Interval H&P Note (Signed)
History and Physical Interval Note:  08/03/2022 2:23 PM  Charles Beasley  has presented today for surgery, with the diagnosis of Left Ureteral Stone.  The various methods of treatment have been discussed with the patient and family. After consideration of risks, benefits and other options for treatment, the patient has consented to  Procedure(s): EXTRACORPOREAL SHOCK WAVE LITHOTRIPSY (ESWL) (Left) as a surgical intervention.  The patient's history has been reviewed, patient examined, no change in status, stable for surgery.  I have reviewed the patient's chart and labs.  Questions were answered to the patient's satisfaction.    RRR CTAB  Hollice Espy

## 2022-08-03 NOTE — Discharge Instructions (Addendum)
See [facility] discharge instructions in chart. AMBULATORY SURGERY  DISCHARGE INSTRUCTIONS   The drugs that you were given will stay in your system until tomorrow so for the next 24 hours you should not:  Drive an automobile Make any legal decisions Drink any alcoholic beverage   You may resume regular meals tomorrow.  Today it is better to start with liquids and gradually work up to solid foods.  You may eat anything you prefer, but it is better to start with liquids, then soup and crackers, and gradually work up to solid foods.   Please notify your doctor immediately if you have any unusual bleeding, trouble breathing, redness and pain at the surgery site, drainage, fever, or pain not relieved by medication.    Additional Instructions:        Please contact your physician with any problems or Same Day Surgery at [phone], Monday through Friday 6 am to 4 pm, or [place] [facility] Main number at [phone].  

## 2022-08-04 ENCOUNTER — Other Ambulatory Visit: Payer: Self-pay

## 2022-08-04 ENCOUNTER — Encounter: Payer: Self-pay | Admitting: Urology

## 2022-08-04 DIAGNOSIS — N201 Calculus of ureter: Secondary | ICD-10-CM

## 2022-08-22 ENCOUNTER — Ambulatory Visit: Payer: BC Managed Care – PPO | Admitting: Urology

## 2022-09-13 NOTE — Progress Notes (Incomplete)
09/14/2022 11:38 AM   Charles Beasley 03-Jan-1976 [id number]  Referring provider: [facility] [place],  [place] [address]   No chief complaint on file.   HPI: Charles Beasley is a 47 y.o. who is status post ESWL who presents today for follow up.  Underwent ESWL on 08/03/2022 for left ureteral stone with Dr. Erlene Quan.  Their postprocedural course was as expected and uneventful.   They have/have not passed fragments. ***   They bring in fragments for analysis. ***  KUB ***  UA ***  PMH: Past Medical History:  Diagnosis Date   Asthma    Bladder cancer ([place])    Colitis    Diabetes mellitus without complication ([place])    Diverticulosis    GERD (gastroesophageal reflux disease)    History of kidney stones    IBS (irritable bowel syndrome)     Surgical History: Past Surgical History:  Procedure Laterality Date   COLONOSCOPY WITH PROPOFOL N/A 01/22/2020   Procedure: COLONOSCOPY WITH PROPOFOL;  Surgeon: Virgel Manifold, MD;  Location: [facility] ENDOSCOPY;  Service: Endoscopy;  Laterality: N/A;   ESOPHAGOGASTRODUODENOSCOPY (EGD) WITH PROPOFOL N/A 01/22/2020   Procedure: ESOPHAGOGASTRODUODENOSCOPY (EGD) WITH PROPOFOL;  Surgeon: Virgel Manifold, MD;  Location: [facility] ENDOSCOPY;  Service: Endoscopy;  Laterality: N/A;   EXTRACORPOREAL SHOCK WAVE LITHOTRIPSY Left 08/03/2022   Procedure: EXTRACORPOREAL SHOCK WAVE LITHOTRIPSY (ESWL);  Surgeon: Hollice Espy, MD;  Location: [facility] ORS;  Service: Urology;  Laterality: Left;    Home Medications:  Current Outpatient Medications on File Prior to Visit  Medication Sig Dispense Refill   ACCU-CHEK GUIDE test strip 2 (two) times daily. for testing as directed     Accu-Chek Softclix Lancets lancets 1 each 3 (three) times daily.     busPIRone (BUSPAR) 7.5 MG tablet Take 7.5 mg by mouth 2 (two) times daily.     citalopram (CELEXA) 20 MG tablet Take 20 mg by mouth daily.     escitalopram (LEXAPRO) 20 MG tablet Take 20 mg by mouth  daily.     glipiZIDE (GLUCOTROL XL) 5 MG 24 hr tablet Take 5 mg by mouth daily.     metFORMIN (GLUCOPHAGE) 1000 MG tablet Take 1,000 mg by mouth 2 (two) times daily.     ondansetron (ZOFRAN-ODT) 4 MG disintegrating tablet Take 1 tablet (4 mg total) by mouth every 8 (eight) hours as needed. 20 tablet 0   oxybutynin (DITROPAN-XL) 10 MG 24 hr tablet Take 1 tablet (10 mg total) by mouth daily. 30 tablet 11   oxyCODONE (ROXICODONE) 5 MG immediate release tablet Take 1 tablet (5 mg total) by mouth every 8 (eight) hours as needed. 10 tablet 0   pantoprazole (PROTONIX) 40 MG tablet Take 40 mg by mouth daily.     simvastatin (ZOCOR) 20 MG tablet Take 20 mg by mouth at bedtime.     tamsulosin (FLOMAX) 0.4 MG CAPS capsule Take 1 capsule (0.4 mg total) by mouth daily. 30 capsule 0   No current facility-administered medications on file prior to visit.    Allergies:  Allergies  Allergen Reactions   Montelukast Photosensitivity   Sertraline Other (See Comments)    Feels out of it, zombie   Tamsulosin Other (See Comments)    "Backwards ejaculation"    Family History: Family History  Problem Relation Age of Onset   Breast cancer Mother    Hypertension Father    Prostate cancer Neg Hx    Bladder Cancer Neg Hx  Kidney cancer Neg Hx     Social History:  reports that he has quit smoking. His smoking use included cigarettes. He has been exposed to tobacco smoke. He has never used smokeless tobacco. He reports that he does not drink alcohol and does not use drugs.  ROS: Pertinent ROS in HPI  Physical Exam: There were no vitals taken for this visit.  Constitutional:  Well nourished. Alert and oriented, No acute distress. HEENT: [place] AT, mask in place.   Trachea midline. Cardiovascular: No clubbing, cyanosis, or edema. Respiratory: Normal respiratory effort, no increased work of breathing. Neurologic: Grossly intact, no focal deficits, moving all 4 extremities. Psychiatric: Normal mood and  affect.  Laboratory Data: Lab Results  Component Value Date   WBC 14.8 (H) 07/31/2022   HGB 12.9 (L) 07/31/2022   HCT 38.5 (L) 07/31/2022   MCV 90.2 07/31/2022   PLT 455 (H) 07/31/2022    Lab Results  Component Value Date   CREATININE 0.91 07/31/2022   Urinalysis ***  I have reviewed the labs.   Pertinent Imaging: ***  I have independently reviewed the films.    Assessment & Plan:  ***  There are no diagnoses linked to this encounter.  No follow-ups on file.  These notes generated with voice recognition software. I apologize for typographical errors.  [place], [place] [address]  [place], [place] [address] [phone]

## 2022-09-14 ENCOUNTER — Ambulatory Visit: Payer: BC Managed Care – PPO | Admitting: Urology

## 2022-09-25 NOTE — Progress Notes (Deleted)
09/26/2022 9:20 PM   RYLEN TOMITA 1976-02-28 [id number]  Referring provider: [facility] [place],  [place] [address]   No chief complaint on file.   HPI: Charles Beasley is a 47 y.o. who is status post ESWL who presents today for follow up.  Underwent ESWL on 08/03/2022 for left ureteral stone with Dr. Erlene Quan.  Their postprocedural course was as expected and uneventful.   They have/have not passed fragments. ***   They bring in fragments for analysis. ***  KUB ***  UA ***  PMH: Past Medical History:  Diagnosis Date   Asthma    Bladder cancer ([place])    Colitis    Diabetes mellitus without complication ([place])    Diverticulosis    GERD (gastroesophageal reflux disease)    History of kidney stones    IBS (irritable bowel syndrome)     Surgical History: Past Surgical History:  Procedure Laterality Date   COLONOSCOPY WITH PROPOFOL N/A 01/22/2020   Procedure: COLONOSCOPY WITH PROPOFOL;  Surgeon: Virgel Manifold, MD;  Location: [facility] ENDOSCOPY;  Service: Endoscopy;  Laterality: N/A;   ESOPHAGOGASTRODUODENOSCOPY (EGD) WITH PROPOFOL N/A 01/22/2020   Procedure: ESOPHAGOGASTRODUODENOSCOPY (EGD) WITH PROPOFOL;  Surgeon: Virgel Manifold, MD;  Location: [facility] ENDOSCOPY;  Service: Endoscopy;  Laterality: N/A;   EXTRACORPOREAL SHOCK WAVE LITHOTRIPSY Left 08/03/2022   Procedure: EXTRACORPOREAL SHOCK WAVE LITHOTRIPSY (ESWL);  Surgeon: Hollice Espy, MD;  Location: [facility] ORS;  Service: Urology;  Laterality: Left;    Home Medications:  Current Outpatient Medications on File Prior to Visit  Medication Sig Dispense Refill   ACCU-CHEK GUIDE test strip 2 (two) times daily. for testing as directed     Accu-Chek Softclix Lancets lancets 1 each 3 (three) times daily.     busPIRone (BUSPAR) 7.5 MG tablet Take 7.5 mg by mouth 2 (two) times daily.     citalopram (CELEXA) 20 MG tablet Take 20 mg by mouth daily.     escitalopram (LEXAPRO) 20 MG tablet Take 20 mg by mouth  daily.     glipiZIDE (GLUCOTROL XL) 5 MG 24 hr tablet Take 5 mg by mouth daily.     metFORMIN (GLUCOPHAGE) 1000 MG tablet Take 1,000 mg by mouth 2 (two) times daily.     ondansetron (ZOFRAN-ODT) 4 MG disintegrating tablet Take 1 tablet (4 mg total) by mouth every 8 (eight) hours as needed. 20 tablet 0   oxybutynin (DITROPAN-XL) 10 MG 24 hr tablet Take 1 tablet (10 mg total) by mouth daily. 30 tablet 11   oxyCODONE (ROXICODONE) 5 MG immediate release tablet Take 1 tablet (5 mg total) by mouth every 8 (eight) hours as needed. 10 tablet 0   pantoprazole (PROTONIX) 40 MG tablet Take 40 mg by mouth daily.     simvastatin (ZOCOR) 20 MG tablet Take 20 mg by mouth at bedtime.     tamsulosin (FLOMAX) 0.4 MG CAPS capsule Take 1 capsule (0.4 mg total) by mouth daily. 30 capsule 0   No current facility-administered medications on file prior to visit.    Allergies:  Allergies  Allergen Reactions   Montelukast Photosensitivity   Sertraline Other (See Comments)    Feels out of it, zombie   Tamsulosin Other (See Comments)    "Backwards ejaculation"    Family History: Family History  Problem Relation Age of Onset   Breast cancer Mother    Hypertension Father    Prostate cancer Neg Hx    Bladder Cancer Neg Hx  Kidney cancer Neg Hx     Social History:  reports that he has quit smoking. His smoking use included cigarettes. He has been exposed to tobacco smoke. He has never used smokeless tobacco. He reports that he does not drink alcohol and does not use drugs.  ROS: Pertinent ROS in HPI  Physical Exam: There were no vitals taken for this visit.  Constitutional:  Well nourished. Alert and oriented, No acute distress. HEENT: [place] AT, mask in place.   Trachea midline. Cardiovascular: No clubbing, cyanosis, or edema. Respiratory: Normal respiratory effort, no increased work of breathing. Neurologic: Grossly intact, no focal deficits, moving all 4 extremities. Psychiatric: Normal mood and  affect.  Laboratory Data: Lab Results  Component Value Date   WBC 14.8 (H) 07/31/2022   HGB 12.9 (L) 07/31/2022   HCT 38.5 (L) 07/31/2022   MCV 90.2 07/31/2022   PLT 455 (H) 07/31/2022    Lab Results  Component Value Date   CREATININE 0.91 07/31/2022   Urinalysis ***  I have reviewed the labs.   Pertinent Imaging: ***  I have independently reviewed the films.    Assessment & Plan:  ***  There are no diagnoses linked to this encounter.  No follow-ups on file.  These notes generated with voice recognition software. I apologize for typographical errors.  [place] [facility], [place] [address]  [place], [place] [address] [phone]

## 2022-09-26 ENCOUNTER — Ambulatory Visit: Payer: BC Managed Care – PPO | Admitting: Urology

## 2022-09-26 ENCOUNTER — Encounter: Payer: Self-pay | Admitting: Urology

## 2022-10-07 ENCOUNTER — Emergency Department
Admission: EM | Admit: 2022-10-07 | Discharge: 2022-10-07 | Disposition: A | Discharge status: Home / Self Care | Payer: BC Managed Care – PPO | Attending: Emergency Medicine | Admitting: Emergency Medicine

## 2022-10-07 ENCOUNTER — Emergency Department: Payer: BC Managed Care – PPO

## 2022-10-07 ENCOUNTER — Other Ambulatory Visit: Payer: Self-pay

## 2022-10-07 DIAGNOSIS — Z1152 Encounter for screening for COVID-19: Secondary | ICD-10-CM | POA: Diagnosis not present

## 2022-10-07 DIAGNOSIS — R1031 Right lower quadrant pain: Secondary | ICD-10-CM | POA: Diagnosis not present

## 2022-10-07 DIAGNOSIS — E119 Type 2 diabetes mellitus without complications: Secondary | ICD-10-CM | POA: Diagnosis not present

## 2022-10-07 DIAGNOSIS — R519 Headache, unspecified: Secondary | ICD-10-CM | POA: Diagnosis present

## 2022-10-07 DIAGNOSIS — J45909 Unspecified asthma, uncomplicated: Secondary | ICD-10-CM | POA: Diagnosis not present

## 2022-10-07 DIAGNOSIS — I10 Essential (primary) hypertension: Secondary | ICD-10-CM

## 2022-10-07 LAB — URINALYSIS, ROUTINE W REFLEX MICROSCOPIC
Bilirubin Urine: NEGATIVE
Glucose, UA: NEGATIVE mg/dL
Hgb urine dipstick: NEGATIVE
Ketones, ur: NEGATIVE mg/dL
Leukocytes,Ua: NEGATIVE
Nitrite: NEGATIVE
Protein, ur: NEGATIVE mg/dL
Specific Gravity, Urine: >1.046 — ABNORMAL HIGH (ref 1.005–1.030)
pH: 5 (ref 5.0–8.0)

## 2022-10-07 LAB — CBC
HCT: 40.7 % (ref 39.0–52.0)
Hemoglobin: 13.7 g/dL (ref 13.0–17.0)
MCH: 30.2 pg (ref 26.0–34.0)
MCHC: 33.7 g/dL (ref 30.0–36.0)
MCV: 89.8 fL (ref 80.0–100.0)
Platelets: 435 10*3/uL — ABNORMAL HIGH (ref 150–400)
RBC: 4.53 MIL/uL (ref 4.22–5.81)
RDW: 13.2 % (ref 11.5–15.5)
WBC: 13.2 10*3/uL — ABNORMAL HIGH (ref 4.0–10.5)
nRBC: 0 % (ref 0.0–0.2)

## 2022-10-07 LAB — BASIC METABOLIC PANEL
Anion gap: 11 (ref 5–15)
BUN: 19 mg/dL (ref 6–20)
CO2: 21 mmol/L — ABNORMAL LOW (ref 22–32)
Calcium: 9.3 mg/dL (ref 8.9–10.3)
Chloride: 101 mmol/L (ref 98–111)
Creatinine, Ser: 0.77 mg/dL (ref 0.61–1.24)
GFR, Estimated: >60 mL/min (ref >60)
Glucose, Bld: 166 mg/dL — ABNORMAL HIGH (ref 70–99)
Potassium: 3.8 mmol/L (ref 3.5–5.1)
Sodium: 133 mmol/L — ABNORMAL LOW (ref 135–145)

## 2022-10-07 LAB — RESP PANEL BY RT-PCR (RSV, FLU A&B, COVID)  RVPGX2
Influenza A by PCR: NEGATIVE
Influenza B by PCR: NEGATIVE
Resp Syncytial Virus by PCR: NEGATIVE
SARS Coronavirus 2 by RT PCR: NEGATIVE

## 2022-10-07 LAB — TROPONIN I (HIGH SENSITIVITY): Troponin I (High Sensitivity): 3 ng/L (ref <18)

## 2022-10-07 MED ORDER — IOHEXOL 300 MG/ML  SOLN
100.0000 mL | Freq: Once | INTRAMUSCULAR | Status: AC | PRN
  Administered 2022-10-07: 100 mL via INTRAVENOUS

## 2022-10-07 MED ORDER — SODIUM CHLORIDE 0.9 % IV BOLUS
1000.0000 mL | Freq: Once | INTRAVENOUS | Status: AC
  Administered 2022-10-07: 1000 mL via INTRAVENOUS

## 2022-10-07 MED ORDER — ONDANSETRON HCL 4 MG/2ML IJ SOLN
4.0000 mg | Freq: Once | INTRAMUSCULAR | Status: AC
  Administered 2022-10-07: 4 mg via INTRAVENOUS
  Filled 2022-10-07: qty 2

## 2022-10-07 MED ORDER — PROPRANOLOL HCL 40 MG PO TABS: 40.0000 mg | ORAL_TABLET | Freq: Two times a day (BID) | ORAL | 1 refills | Status: AC

## 2022-10-07 MED ORDER — MORPHINE SULFATE (PF) 4 MG/ML IV SOLN
4.0000 mg | Freq: Once | INTRAVENOUS | Status: AC
  Administered 2022-10-07: 4 mg via INTRAVENOUS
  Filled 2022-10-07: qty 1

## 2022-10-07 MED ORDER — KETOROLAC TROMETHAMINE 30 MG/ML IJ SOLN
30.0000 mg | Freq: Once | INTRAMUSCULAR | Status: AC
  Administered 2022-10-07: 30 mg via INTRAVENOUS
  Filled 2022-10-07: qty 1

## 2022-10-07 NOTE — ED Notes (Signed)
Pt requested to hold zofran until needed.

## 2022-10-07 NOTE — ED Provider Notes (Signed)
[facility] Emergency Department Provider Note     Event Date/Time   First MD Initiated Contact with Patient 10/07/22 1617     (approximate)   History   Hypertension (X7 days)   HPI  Charles Beasley is a 47 y.o. male with a history of diabetes, GERD, asthma, IBS, nephrolithiasis, presents to the ED for evaluation of elevated blood pressure readings.  Patient without current diagnosis of HTN, presents to the ED noting blood pressure readings over the last week have been running high with a systolic max of [id number], and diastolic max of [id number], this week.  Patient with a history of nephrolithiasis, endorses some right lower quadrant abdominal pain as well as some intermittent acute on chronic headache pain.  He also notes some facial paresthesias on both cheek with onset of the headache.  He denies any facial paralysis, generalized weakness, or slurred speech.  He also notes that he had a Tmax of 102 F on Wednesday, 3 days prior to arrival.  He noticed some intermittent nausea without vomiting, loose stools consistent with his IBS history, and headache pain not controlled by OTC Tylenol or Motrin.  Patient denies any chest pain, shortness of breath, vision changes.  He reports his last PCP visit was 3 months prior.   Physical Exam   Triage Vital Signs: ED Triage Vitals [10/07/22 1556]  Enc Vitals Group     BP (!) 140/90     Pulse Rate 94     Resp 16     Temp 97.8 F (36.6 C)     Temp Source Oral     SpO2 98 %     Weight 156 lb (70.8 kg)     Height '5\' 9"'$  (1.753 m)     Head Circumference      Peak Flow      Pain Score 0     Pain Loc      Pain Edu?      Excl. in [place]?     Most recent vital signs: Vitals:   10/07/22 1556 10/07/22 1939  BP: (!) 140/90 (!) 146/88  Pulse: 94 75  Resp: 16 16  Temp: 97.8 F (36.6 C) 98 F (36.7 C)  SpO2: 98% 98%    General Awake, no distress. NAD HEENT NCAT. PERRL. EOMI. No rhinorrhea. Mucous membranes are moist. TMs intact  and clear bilaterally. CV:  Good peripheral perfusion.  RESP:  Normal effort.  ABD:  No distention.  Tender to palpation to the right lower quadrant.  No flank tenderness elicited. NEURO: Cranial nerves II to XII grossly intact.  Normal gross sensation noted.   ED Results / Procedures / Treatments   Labs (all labs ordered are listed, but only abnormal results are displayed) Labs Reviewed  CBC - Abnormal; Notable for the following components:      Result Value   WBC 13.2 (*)    Platelets 435 (*)    All other components within normal limits  BASIC METABOLIC PANEL - Abnormal; Notable for the following components:   Sodium 133 (*)    CO2 21 (*)    Glucose, Bld 166 (*)    All other components within normal limits  URINALYSIS, ROUTINE W REFLEX MICROSCOPIC - Abnormal; Notable for the following components:   Color, Urine STRAW (*)    APPearance CLEAR (*)    Specific Gravity, Urine >1.046 (*)    All other components within normal limits  RESP PANEL BY RT-PCR (RSV, FLU  A&B, COVID)  [id number]  TROPONIN I (HIGH SENSITIVITY)     EKG  Vent. rate 81 BPM PR interval 150 ms QRS duration 92 ms QT/QTcB 386/448 ms P-R-T axes 68 76 26 Normal sinus rhythm Minimal voltage criteria for LVH, may be normal variant ( Sokolow-Lyon ) Borderline ECG  RADIOLOGY  I personally viewed and evaluated these images as part of my medical decision making, as well as reviewing the written report by the radiologist.  ED Provider Interpretation: no acute findings to explain symptoms  CT ABDOMEN PELVIS W CONTRAST  Result Date: 10/07/2022 CLINICAL DATA:  Right lower quadrant pain EXAM: CT ABDOMEN AND PELVIS WITH CONTRAST TECHNIQUE: Multidetector CT imaging of the abdomen and pelvis was performed using the standard protocol following bolus administration of intravenous contrast. RADIATION DOSE REDUCTION: This exam was performed according to the departmental dose-optimization program which includes automated  exposure control, adjustment of the mA and/or kV according to patient size and/or use of iterative reconstruction technique. CONTRAST:  162m OMNIPAQUE IOHEXOL 300 MG/ML  SOLN COMPARISON:  CT abdomen and pelvis 07/31/2022 FINDINGS: Lower chest: No acute abnormality. Hepatobiliary: There is fatty infiltration of the liver. No focal liver abnormality is seen. No gallstones, gallbladder wall thickening, or biliary dilatation. Pancreas: Unremarkable. No pancreatic ductal dilatation or surrounding inflammatory changes. Spleen: Normal in size without focal abnormality. Adrenals/Urinary Tract: There are calculi in the inferior pole the left kidney measuring up to 4 mm. There is no hydronephrosis or perinephric fat stranding. The bladder and adrenal glands are within normal limits. Stomach/Bowel: Stomach is within normal limits. Appendix appears normal. No evidence of bowel wall thickening, distention, or inflammatory changes. There is sigmoid colon diverticulosis. There is a large amount of stool throughout the colon. Vascular/Lymphatic: Aortic atherosclerosis. No enlarged abdominal or pelvic lymph nodes. Reproductive: Prostate is unremarkable. Other: No abdominal wall hernia or abnormality. No abdominopelvic ascites. Musculoskeletal: No acute or significant osseous findings. IMPRESSION: 1. No acute localizing process in the abdomen or pelvis. 2. Fatty infiltration of the liver. 3. Nonobstructing left renal calculi. Aortic Atherosclerosis (ICD10-I70.0). Electronically Signed   By: ARonney AstersM.D.   On: 10/07/2022 18:01   CT HEAD WO CONTRAST (5MM)  Result Date: 10/07/2022 CLINICAL DATA:  Headache, increasing frequency or severity. EXAM: CT HEAD WITHOUT CONTRAST TECHNIQUE: Contiguous axial images were obtained from the base of the skull through the vertex without intravenous contrast. RADIATION DOSE REDUCTION: This exam was performed according to the departmental dose-optimization program which includes automated  exposure control, adjustment of the mA and/or kV according to patient size and/or use of iterative reconstruction technique. COMPARISON:  Head CT 09/06/2019. FINDINGS: Brain: No acute hemorrhage, mass effect or midline shift. Gray-white differentiation is preserved. No hydrocephalus. No extra-axial collection. Basilar cisterns are patent. Vascular: No hyperdense vessel or unexpected calcification. Skull: No calvarial fracture or suspicious bone lesion. Skull base is unremarkable. Sinuses/Orbits: Unremarkable. Other: None. IMPRESSION: No acute intracranial abnormality. Electronically Signed   By: WEmmit AlexandersM.D.   On: 10/07/2022 17:59     PROCEDURES:  Critical Care performed: No  Procedures   MEDICATIONS ORDERED IN ED: Medications  sodium chloride 0.9 % bolus 1,000 mL (0 mLs Intravenous Stopped 10/07/22 1928)  ondansetron (ZOFRAN) injection 4 mg (4 mg Intravenous Given 10/07/22 1803)  morphine (PF) 4 MG/ML injection 4 mg (4 mg Intravenous Given 10/07/22 1655)  iohexol (OMNIPAQUE) 300 MG/ML solution 100 mL (100 mLs Intravenous Contrast Given 10/07/22 1745)  ketorolac (TORADOL) 30 MG/ML injection 30 mg (30 mg Intravenous  Given 10/07/22 1928)     IMPRESSION / MDM / ASSESSMENT AND PLAN / ED COURSE  I reviewed the triage vital signs and the nursing notes.                              Differential diagnosis includes, but is not limited to, acute appendicitis, renal colic, testicular torsion, urinary tract infection/pyelonephritis, prostatitis,  epididymitis, diverticulitis, small bowel obstruction or ileus, colitis, abdominal aortic aneurysm, gastroenteritis, hernia, intracranial hemorrhage, meningitis/encephalitis, previous head trauma, cavernous venous thrombosis, tension headache, temporal arteritis, migraine or migraine equivalent, idiopathic intracranial hypertension, and non-specific headache.  Patient's presentation is most consistent with acute complicated illness / injury requiring diagnostic  workup.  To the ED with multiple complaints including acute on chronic headache, paresthesias to the face, and right lower quadrant abdominal pain.  Patient also had concerns over elevated blood pressure readings over the last week.  He was evaluated for his complaints, found have reassuring exam and workup overall.  No acute lab abnormalities noted.  Patient with a normal EKG without malignant arrhythmia.  Troponin is normal and labs without signs of critical anemia, electrolyte abnormality. Patient with stable leukocytosis noted.  CT imaging of the head and abdomen/pelvis, did not show any acute findings to explain the patient's symptoms.  The patient's migraine history and elevated blood pressure, we discussed treatment options and found that use of the propranolol would offer the best management of both his headaches and his high blood pressure.  Patient's diagnosis is consistent with hypertension and chronic migraine headache. Patient will be discharged home with prescriptions for Tylenol. Patient is to follow up with his primary provider as needed or otherwise directed. Patient is given ED precautions to return to the ED for any worsening or new symptoms.  Clinical Course as of 10/07/22 2326  Sat Oct 07, 2022  [id number] Basic metabolic panel [JM]    Clinical Course User Index [JM] Nikki Glanzer, Dannielle Karvonen, PA-C    FINAL CLINICAL IMPRESSION(S) / ED DIAGNOSES   Final diagnoses:  Hypertension, unspecified type  Chronic nonintractable headache, unspecified headache type     Rx / DC Orders   ED Discharge Orders          Ordered    propranolol (INDERAL) 40 MG tablet  2 times daily        10/07/22 1941             Note:  This document was prepared using Dragon voice recognition software and may include unintentional dictation errors.    Melvenia Needles, PA-C 10/07/22 2326    Harvest Dark, MD 10/11/22 1357

## 2022-10-07 NOTE — Discharge Instructions (Signed)
Your exam, labs, EKG, and CT scans are all normal reassuring at this time.  No explanation at this time for your intermittent fevers but no evidence of any viral infection, appendicitis, or colitis.  The headache may be a side effect of your headache pain and or flank pain.  Take the propranolol as directed, and continue to monitor your blood pressure readings.  Follow-up with your primary provider for ongoing management of blood pressure and headaches.  Consider referral to neurologist for ongoing headache complaints.

## 2022-10-07 NOTE — ED Triage Notes (Signed)
Pt to ED from home for HTN. He has a machine at home and checks it and it has been running high all week 170/130 this week. Last PCP visit was 3 months ago and all was well. Pt is CAOx4 and in no acute distress and ambulatory in triage. Denies CP, SOB, vision or headache issues.

## 2022-12-29 ENCOUNTER — Other Ambulatory Visit: Payer: Self-pay

## 2022-12-29 DIAGNOSIS — R1030 Lower abdominal pain, unspecified: Secondary | ICD-10-CM | POA: Diagnosis present

## 2022-12-29 DIAGNOSIS — Z1152 Encounter for screening for COVID-19: Secondary | ICD-10-CM | POA: Diagnosis not present

## 2022-12-29 DIAGNOSIS — J329 Chronic sinusitis, unspecified: Secondary | ICD-10-CM | POA: Insufficient documentation

## 2022-12-29 DIAGNOSIS — R1010 Upper abdominal pain, unspecified: Secondary | ICD-10-CM | POA: Diagnosis not present

## 2022-12-29 LAB — CBC
HCT: 40.2 % (ref 39.0–52.0)
Hemoglobin: 13.6 g/dL (ref 13.0–17.0)
MCH: 30.4 pg (ref 26.0–34.0)
MCHC: 33.8 g/dL (ref 30.0–36.0)
MCV: 89.9 fL (ref 80.0–100.0)
Platelets: 339 10*3/uL (ref 150–400)
RBC: 4.47 MIL/uL (ref 4.22–5.81)
RDW: 13.2 % (ref 11.5–15.5)
WBC: 8.6 10*3/uL (ref 4.0–10.5)
nRBC: 0 % (ref 0.0–0.2)

## 2022-12-29 LAB — COMPREHENSIVE METABOLIC PANEL
ALT: 17 U/L (ref 0–44)
AST: 21 U/L (ref 15–41)
Albumin: 4.1 g/dL (ref 3.5–5.0)
Alkaline Phosphatase: 67 U/L (ref 38–126)
Anion gap: 10 (ref 5–15)
BUN: 16 mg/dL (ref 6–20)
CO2: 22 mmol/L (ref 22–32)
Calcium: 8.7 mg/dL — ABNORMAL LOW (ref 8.9–10.3)
Chloride: 101 mmol/L (ref 98–111)
Creatinine, Ser: 0.93 mg/dL (ref 0.61–1.24)
GFR, Estimated: >60 mL/min (ref >60)
Glucose, Bld: 288 mg/dL — ABNORMAL HIGH (ref 70–99)
Potassium: 3.4 mmol/L — ABNORMAL LOW (ref 3.5–5.1)
Sodium: 133 mmol/L — ABNORMAL LOW (ref 135–145)
Total Bilirubin: 0.5 mg/dL (ref 0.3–1.2)
Total Protein: 7.6 g/dL (ref 6.5–8.1)

## 2022-12-29 LAB — LIPASE, BLOOD: Lipase: 39 U/L (ref 11–51)

## 2022-12-29 LAB — TROPONIN I (HIGH SENSITIVITY): Troponin I (High Sensitivity): 4 ng/L (ref <18)

## 2022-12-29 NOTE — ED Triage Notes (Signed)
Pt states he went to PCP today for sinus infection but started having abdominal pain, nausea, headache and fever at home today that did not improve w/ tylenol/ibuprofen. Pt c/o of dizziness, fatigue, and black stools. Pt had negative covid test,  denies blood thinners,cough, CP, SHOB. Pt AOx4, NAD noted.

## 2022-12-30 ENCOUNTER — Emergency Department
Admission: EM | Admit: 2022-12-30 | Discharge: 2022-12-30 | Disposition: A | Discharge status: Home / Self Care | Payer: BC Managed Care – PPO | Attending: Emergency Medicine | Admitting: Emergency Medicine

## 2022-12-30 ENCOUNTER — Emergency Department: Payer: BC Managed Care – PPO

## 2022-12-30 DIAGNOSIS — R109 Unspecified abdominal pain: Secondary | ICD-10-CM

## 2022-12-30 DIAGNOSIS — J329 Chronic sinusitis, unspecified: Secondary | ICD-10-CM

## 2022-12-30 DIAGNOSIS — R1010 Upper abdominal pain, unspecified: Secondary | ICD-10-CM | POA: Diagnosis not present

## 2022-12-30 LAB — URINALYSIS, ROUTINE W REFLEX MICROSCOPIC
Bacteria, UA: NONE SEEN
Bilirubin Urine: NEGATIVE
Glucose, UA: >=500 mg/dL — AB
Ketones, ur: 5 mg/dL — AB
Leukocytes,Ua: NEGATIVE
Nitrite: NEGATIVE
Protein, ur: NEGATIVE mg/dL
Specific Gravity, Urine: 1.027 (ref 1.005–1.030)
Squamous Epithelial / HPF: NONE SEEN /HPF (ref 0–5)
pH: 5 (ref 5.0–8.0)

## 2022-12-30 LAB — RESP PANEL BY RT-PCR (RSV, FLU A&B, COVID)  RVPGX2
Influenza A by PCR: NEGATIVE
Influenza B by PCR: NEGATIVE
Resp Syncytial Virus by PCR: NEGATIVE
SARS Coronavirus 2 by RT PCR: NEGATIVE

## 2022-12-30 LAB — TROPONIN I (HIGH SENSITIVITY): Troponin I (High Sensitivity): 5 ng/L (ref <18)

## 2022-12-30 MED ORDER — AMOXICILLIN-POT CLAVULANATE 875-125 MG PO TABS: 1.0000 | ORAL_TABLET | Freq: Two times a day (BID) | ORAL | 0 refills | Status: AC

## 2022-12-30 MED ORDER — ONDANSETRON HCL 4 MG/2ML IJ SOLN
4.0000 mg | Freq: Once | INTRAMUSCULAR | Status: AC
  Administered 2022-12-30: 4 mg via INTRAVENOUS
  Filled 2022-12-30: qty 2

## 2022-12-30 MED ORDER — SODIUM CHLORIDE 0.9 % IV BOLUS
1000.0000 mL | Freq: Once | INTRAVENOUS | Status: AC
  Administered 2022-12-30: 1000 mL via INTRAVENOUS

## 2022-12-30 MED ORDER — IOHEXOL 300 MG/ML  SOLN
100.0000 mL | Freq: Once | INTRAMUSCULAR | Status: AC | PRN
  Administered 2022-12-30: 100 mL via INTRAVENOUS

## 2022-12-30 MED ORDER — ACETAMINOPHEN 325 MG PO TABS
650.0000 mg | ORAL_TABLET | Freq: Once | ORAL | Status: AC
  Administered 2022-12-30: 650 mg via ORAL
  Filled 2022-12-30: qty 2

## 2022-12-30 MED ORDER — HALOPERIDOL LACTATE 5 MG/ML IJ SOLN
5.0000 mg | Freq: Once | INTRAMUSCULAR | Status: AC
  Administered 2022-12-30: 5 mg via INTRAVENOUS
  Filled 2022-12-30: qty 1

## 2022-12-30 MED ORDER — FENTANYL CITRATE PF 50 MCG/ML IJ SOSY
50.0000 ug | PREFILLED_SYRINGE | Freq: Once | INTRAMUSCULAR | Status: AC
  Administered 2022-12-30: 50 ug via INTRAVENOUS
  Filled 2022-12-30: qty 1

## 2022-12-30 MED ORDER — DICYCLOMINE HCL 10 MG PO CAPS: 10.0000 mg | ORAL_CAPSULE | Freq: Three times a day (TID) | ORAL | 0 refills | Status: AC | PRN

## 2022-12-30 NOTE — Discharge Instructions (Addendum)
Please seek medical attention for any high fevers, chest pain, shortness of breath, change in behavior, persistent vomiting, bloody stool or any other new or concerning symptoms.  

## 2022-12-30 NOTE — ED Provider Notes (Signed)
[facility] Provider Note    Event Date/Time   First MD Initiated Contact with Patient 12/30/22 0235     (approximate)   History   Abdominal Pain   HPI  Charles Beasley is a 47 y.o. male who presents to the emergency department today with primary concern for abdominal pain.  The patient states that he has been dealing with sinusitis type symptoms over the past few days with increased head pressure.  However today he has developed lower abdominal pain.  It is located across his lower abdomen.  It is severe.  He says he has history of significant abdominal issues including IBS however has never had pain quite like this before.  Denies any bloody stool.     Physical Exam   Triage Vital Signs: ED Triage Vitals  Enc Vitals Group     BP 12/29/22 2128 129/83     Pulse Rate 12/29/22 2128 97     Resp 12/29/22 2128 18     Temp 12/29/22 2128 98.3 F (36.8 C)     Temp Source 12/29/22 2128 Oral     SpO2 12/29/22 2128 95 %     Weight --      Height --      Head Circumference --      Peak Flow --      Pain Score 12/29/22 2131 8     Pain Loc --      Pain Edu? --      Excl. in GC? --     Most recent vital signs: Vitals:   12/29/22 2128 12/30/22 0052  BP: 129/83 136/82  Pulse: 97 (!) 120  Resp: 18 18  Temp: 98.3 F (36.8 C) (!) 101.9 F (38.8 C)  SpO2: 95% 91%   General: Awake, alert, oriented. CV:  Good peripheral perfusion. Regular rate and rhythm. Resp:  Normal effort. Lungs clear. Abd:  No distention. Tender to palpation primarily in the upper abdomen.   ED Results / Procedures / Treatments   Labs (all labs ordered are listed, but only abnormal results are displayed) Labs Reviewed  COMPREHENSIVE METABOLIC PANEL - Abnormal; Notable for the following components:      Result Value   Sodium 133 (*)    Potassium 3.4 (*)    Glucose, Bld 288 (*)    Calcium 8.7 (*)    All other components within normal limits  URINALYSIS, ROUTINE W REFLEX  MICROSCOPIC - Abnormal; Notable for the following components:   Color, Urine STRAW (*)    APPearance CLEAR (*)    Glucose, UA >=500 (*)    Hgb urine dipstick SMALL (*)    Ketones, ur 5 (*)    All other components within normal limits  RESP PANEL BY RT-PCR (RSV, FLU A&B, COVID)  RVPGX2  LIPASE, BLOOD  CBC  TROPONIN I (HIGH SENSITIVITY)  TROPONIN I (HIGH SENSITIVITY)     EKG  I, Phineas Semen, attending physician, personally viewed and interpreted this EKG  EKG Time: 2136 Rate: 99 Rhythm: normal sinus rhythm Axis: normal Intervals: qtc 456 QRS: narrow, q waves v1 ST changes: no st elevation Impression: abnormal ekg    RADIOLOGY I independently interpreted and visualized the ct abd/pel. My interpretation: No free air Radiology interpretation:  IMPRESSION:  1. No acute abnormality or explanation for abdominal pain.  2. Hepatic steatosis.  3. Nonobstructing left nephrolithiasis.    Aortic Atherosclerosis (ICD10-I70.0).      PROCEDURES:  Critical Care performed: No  MEDICATIONS ORDERED IN ED: Medications  acetaminophen (TYLENOL) tablet 650 mg (650 mg Oral Given 12/30/22 0059)     IMPRESSION / MDM / ASSESSMENT AND PLAN / ED COURSE  I reviewed the triage vital signs and the nursing notes.                              Differential diagnosis includes, but is not limited to, appendicitis, diverticulitis, gastritis, gastroenteritis, pancreatitis, hepatitis  Patient's presentation is most consistent with acute presentation with potential threat to life or bodily function.   The patient is on the cardiac monitor to evaluate for evidence of arrhythmia and/or significant heart rate changes.  Patient presented to the emergency department today because of concerns for abdominal pain.  Patient states he also has been dealing with sinusitis.  On exam patient has have some tenderness although primarily in the upper abdomen.  No leukocytosis on blood work but given  patient's pain a CT scan was obtained.  This did not show any concerning localized infections.  The patient did feel better after IV fluids and medication.  At this time I think is reasonable for patient be discharged home.      FINAL CLINICAL IMPRESSION(S) / ED DIAGNOSES   Final diagnoses:  Abdominal pain, unspecified abdominal location  Sinusitis, unspecified chronicity, unspecified location    Note:  This document was prepared using Dragon voice recognition software and may include unintentional dictation errors.    Phineas Semen, MD 12/30/22 [phone]

## 2023-01-14 ENCOUNTER — Other Ambulatory Visit: Payer: Self-pay | Admitting: Urology

## 2023-01-14 DIAGNOSIS — R350 Frequency of micturition: Secondary | ICD-10-CM

## 2023-01-24 ENCOUNTER — Ambulatory Visit: Payer: Medicaid Other | Admitting: Urology

## 2023-02-01 ENCOUNTER — Encounter: Payer: Self-pay | Admitting: *Deleted

## 2023-02-01 ENCOUNTER — Emergency Department: Payer: BC Managed Care – PPO

## 2023-02-01 ENCOUNTER — Emergency Department
Admission: EM | Admit: 2023-02-01 | Discharge: 2023-02-01 | Disposition: A | Discharge status: Home / Self Care | Payer: BC Managed Care – PPO | Attending: Emergency Medicine | Admitting: Emergency Medicine

## 2023-02-01 ENCOUNTER — Other Ambulatory Visit: Payer: Self-pay

## 2023-02-01 DIAGNOSIS — E119 Type 2 diabetes mellitus without complications: Secondary | ICD-10-CM | POA: Diagnosis not present

## 2023-02-01 DIAGNOSIS — R519 Headache, unspecified: Secondary | ICD-10-CM | POA: Diagnosis not present

## 2023-02-01 DIAGNOSIS — L03119 Cellulitis of unspecified part of limb: Secondary | ICD-10-CM

## 2023-02-01 DIAGNOSIS — L03116 Cellulitis of left lower limb: Secondary | ICD-10-CM | POA: Diagnosis present

## 2023-02-01 MED ORDER — CEPHALEXIN 500 MG PO CAPS
500.0000 mg | ORAL_CAPSULE | Freq: Once | ORAL | Status: AC
  Administered 2023-02-01: 500 mg via ORAL
  Filled 2023-02-01: qty 1

## 2023-02-01 MED ORDER — CEPHALEXIN 500 MG PO CAPS: 500.0000 mg | ORAL_CAPSULE | Freq: Four times a day (QID) | ORAL | 0 refills | Status: AC

## 2023-02-01 MED ORDER — ACETAMINOPHEN 325 MG PO TABS
650.0000 mg | ORAL_TABLET | Freq: Once | ORAL | Status: AC
  Administered 2023-02-01: 650 mg via ORAL
  Filled 2023-02-01: qty 2

## 2023-02-01 MED ORDER — IBUPROFEN 600 MG PO TABS
600.0000 mg | ORAL_TABLET | Freq: Once | ORAL | Status: AC
  Administered 2023-02-01: 600 mg via ORAL
  Filled 2023-02-01: qty 1

## 2023-02-01 NOTE — ED Notes (Signed)
ED Provider at bedside. 

## 2023-02-01 NOTE — Discharge Instructions (Addendum)
You are prescribed an antibiotic for an infection of your foot.  Please take all of the medication even if you experience an improvement in your symptoms.  You can take 650 mg of Tylenol and 600 mg of ibuprofen as needed for pain.  Please return to the emergency department any new or worsening symptoms.

## 2023-02-01 NOTE — ED Notes (Signed)
Patient discharged at this time. Ambulated to lobby with independent and steady gait. Breathing unlabored speaking in full sentences. Verbalized understanding of all discharge, follow up, and medication teaching. Discharged homed with all belongings.   

## 2023-02-01 NOTE — ED Triage Notes (Signed)
Pt states a piece of wood fell on top of left foot yesterday   pt was wearing shoes. Abrasion to top of left foot.  Pt states pain is worse today.  Pt alert

## 2023-02-01 NOTE — ED Provider Notes (Signed)
[facility] Provider Note    Event Date/Time   First MD Initiated Contact with Patient 02/01/23 2157     (approximate)   History   Foot Injury   HPI  Charles Beasley is a 47 y.o. male with PMH of diabetes who presents for evaluation of a left foot injury.  Patient states that he dropped a piece of wood on his foot yesterday and has had increasing pain ever since.  Patient has not taken any medication at home for pain.  He also reports that today he scraped his head on a cabinet and has had a headache since.  He denies LOC, nausea, vomiting.     Physical Exam   Triage Vital Signs: ED Triage Vitals  Enc Vitals Group     BP 02/01/23 2150 (!) 159/91     Pulse Rate 02/01/23 2150 (!) 107     Resp 02/01/23 2150 18     Temp 02/01/23 2150 97.9 F (36.6 C)     Temp Source 02/01/23 2150 Oral     SpO2 02/01/23 2150 96 %     Weight 02/01/23 2151 157 lb (71.2 kg)     Height 02/01/23 2151 5\' 9"  (1.753 m)     Head Circumference --      Peak Flow --      Pain Score 02/01/23 2151 8     Pain Loc --      Pain Edu? --      Excl. in GC? --     Most recent vital signs: Vitals:   02/01/23 2150  BP: (!) 159/91  Pulse: (!) 107  Resp: 18  Temp: 97.9 F (36.6 C)  SpO2: 96%     General: Awake, no distress.  CV:  Good peripheral perfusion.  Resp:  Normal effort.  Abd:  No distention.  Other:  Top of the left foot is erythematous, swollen and very tender to palpation with a superficial abrasion.  Neurovascularly intact.  ROM maintained.  Abrasion to the Right side of the patient's head.  No focal neurodeficits.   ED Results / Procedures / Treatments   Labs (all labs ordered are listed, but only abnormal results are displayed) Labs Reviewed - No data to display   RADIOLOGY  Left foot x-rays obtained in the ED today.  I interpreted the images as well as reviewed the radiologist report.    PROCEDURES:  Critical Care performed:  No  Procedures   MEDICATIONS ORDERED IN ED: Medications  ibuprofen (ADVIL) tablet 600 mg (600 mg Oral Given 02/01/23 2225)  acetaminophen (TYLENOL) tablet 650 mg (650 mg Oral Given 02/01/23 2225)  cephALEXin (KEFLEX) capsule 500 mg (500 mg Oral Given 02/01/23 2227)     IMPRESSION / MDM / ASSESSMENT AND PLAN / ED COURSE  I reviewed the triage vital signs and the nursing notes.                             47 year old male with PMH of diabetes and hypertension presents for evaluation of left foot pain.  BP and pulse elevated in triage.  On exam patient is NAD.   Differential diagnosis includes, but is not limited to, abrasion, cellulitis, fracture, contusion.  Patient's presentation is most consistent with acute complicated illness / injury requiring diagnostic workup.  Left foot x-rays obtained in the ED.  I interpreted the images as well as reviewed the radiologist report which is negative for  fracture but does show soft tissue swelling.  On exam patient appears to have developed a cellulitis as the tissue surrounding the abrasion on the top of the foot is erythematous, swollen and warm.  Patient will be started on a course of oral antibiotics, he was given the first dose of the medication while in the ED.  He was given crutches to help him walk. I advised him to take Tylenol and ibuprofen as needed for pain, which she was given while in the ED.  He is aware that he can return to the ED with any new or worsening symptoms.  Patient was agreeable to plan, all questions were answered and he was stable at discharge.      FINAL CLINICAL IMPRESSION(S) / ED DIAGNOSES   Final diagnoses:  Cellulitis of foot     Rx / DC Orders   ED Discharge Orders          Ordered    cephALEXin (KEFLEX) 500 MG capsule  4 times daily        02/01/23 2226             Note:  This document was prepared using Dragon voice recognition software and may include unintentional dictation errors.   Cameron Ali, PA-C 02/01/23 2239    Pilar Jarvis, MD 02/01/23 2308

## 2023-02-07 ENCOUNTER — Emergency Department: Payer: BC Managed Care – PPO

## 2023-02-07 ENCOUNTER — Emergency Department
Admission: EM | Admit: 2023-02-07 | Discharge: 2023-02-07 | Disposition: A | Discharge status: Home / Self Care | Payer: BC Managed Care – PPO | Attending: Emergency Medicine | Admitting: Emergency Medicine

## 2023-02-07 ENCOUNTER — Other Ambulatory Visit: Payer: Self-pay

## 2023-02-07 ENCOUNTER — Encounter: Payer: Self-pay | Admitting: Emergency Medicine

## 2023-02-07 DIAGNOSIS — Z1152 Encounter for screening for COVID-19: Secondary | ICD-10-CM | POA: Diagnosis not present

## 2023-02-07 DIAGNOSIS — B349 Viral infection, unspecified: Secondary | ICD-10-CM | POA: Diagnosis not present

## 2023-02-07 DIAGNOSIS — R000 Tachycardia, unspecified: Secondary | ICD-10-CM | POA: Insufficient documentation

## 2023-02-07 DIAGNOSIS — E1165 Type 2 diabetes mellitus with hyperglycemia: Secondary | ICD-10-CM | POA: Diagnosis not present

## 2023-02-07 DIAGNOSIS — R739 Hyperglycemia, unspecified: Secondary | ICD-10-CM

## 2023-02-07 LAB — URINALYSIS, ROUTINE W REFLEX MICROSCOPIC
Bacteria, UA: NONE SEEN
Bilirubin Urine: NEGATIVE
Glucose, UA: >=500 mg/dL — AB
Hgb urine dipstick: NEGATIVE
Ketones, ur: NEGATIVE mg/dL
Leukocytes,Ua: NEGATIVE
Nitrite: NEGATIVE
Protein, ur: 30 mg/dL — AB
Specific Gravity, Urine: 1.032 — ABNORMAL HIGH (ref 1.005–1.030)
Squamous Epithelial / HPF: NONE SEEN /HPF (ref 0–5)
pH: 5 (ref 5.0–8.0)

## 2023-02-07 LAB — CBC
HCT: 43.2 % (ref 39.0–52.0)
Hemoglobin: 14.8 g/dL (ref 13.0–17.0)
MCH: 29.8 pg (ref 26.0–34.0)
MCHC: 34.3 g/dL (ref 30.0–36.0)
MCV: 87.1 fL (ref 80.0–100.0)
Platelets: 488 10*3/uL — ABNORMAL HIGH (ref 150–400)
RBC: 4.96 MIL/uL (ref 4.22–5.81)
RDW: 13.1 % (ref 11.5–15.5)
WBC: 17.4 10*3/uL — ABNORMAL HIGH (ref 4.0–10.5)
nRBC: 0 % (ref 0.0–0.2)

## 2023-02-07 LAB — BASIC METABOLIC PANEL
Anion gap: 12 (ref 5–15)
BUN: 12 mg/dL (ref 6–20)
CO2: 19 mmol/L — ABNORMAL LOW (ref 22–32)
Calcium: 10.1 mg/dL (ref 8.9–10.3)
Chloride: 96 mmol/L — ABNORMAL LOW (ref 98–111)
Creatinine, Ser: 0.88 mg/dL (ref 0.61–1.24)
GFR, Estimated: >60 mL/min (ref >60)
Glucose, Bld: 568 mg/dL (ref 70–99)
Potassium: 3.7 mmol/L (ref 3.5–5.1)
Sodium: 127 mmol/L — ABNORMAL LOW (ref 135–145)

## 2023-02-07 LAB — CBG MONITORING, ED
Glucose-Capillary: 599 mg/dL (ref 70–99)
Glucose-Capillary: 313 mg/dL — ABNORMAL HIGH (ref 70–99)

## 2023-02-07 LAB — TROPONIN I (HIGH SENSITIVITY): Troponin I (High Sensitivity): 8 ng/L (ref <18)

## 2023-02-07 LAB — RESP PANEL BY RT-PCR (RSV, FLU A&B, COVID)  RVPGX2
Influenza A by PCR: NEGATIVE
Influenza B by PCR: NEGATIVE
Resp Syncytial Virus by PCR: NEGATIVE
SARS Coronavirus 2 by RT PCR: NEGATIVE

## 2023-02-07 LAB — D-DIMER, QUANTITATIVE: D-Dimer, Quant: 0.28 ug/mL-FEU (ref 0.00–0.50)

## 2023-02-07 MED ORDER — SODIUM CHLORIDE 0.9 % IV BOLUS
1000.0000 mL | Freq: Once | INTRAVENOUS | Status: AC
  Administered 2023-02-07: 1000 mL via INTRAVENOUS

## 2023-02-07 MED ORDER — OXYCODONE-ACETAMINOPHEN 5-325 MG PO TABS
1.0000 | ORAL_TABLET | Freq: Once | ORAL | Status: AC
  Administered 2023-02-07: 1 via ORAL
  Filled 2023-02-07: qty 1

## 2023-02-07 MED ORDER — ONDANSETRON 4 MG PO TBDP: 4.0000 mg | ORAL_TABLET | Freq: Three times a day (TID) | ORAL | 0 refills | Status: AC | PRN

## 2023-02-07 NOTE — ED Provider Notes (Signed)
[facility] Provider Note    Event Date/Time   First MD Initiated Contact with Patient 02/07/23 1811     (approximate)   History   Chief Complaint Hyperglycemia   HPI  Charles Beasley is a 47 y.o. male with past medical history of diabetes and GERD who presents to the ED for hyperglycemia.  Patient reports that he has been feeling ill with generalized weakness, fatigue, nausea, headache, and cough for the past couple of days.  He is not aware of any fevers, does state his wife has been sick with similar symptoms.  He does report feeling somewhat short of breath with some sharp pain in his chest, has not noticed any pain or swelling in his legs.  He also notes that his blood sugars have been reading "high" at home despite taking his diabetic medication as prescribed.  He states he has been urinating more frequently with some dysuria.     Physical Exam   Triage Vital Signs: ED Triage Vitals  Enc Vitals Group     BP 02/07/23 1736 (!) 136/96     Pulse Rate 02/07/23 1736 (!) 112     Resp 02/07/23 1736 20     Temp 02/07/23 1736 98.2 F (36.8 C)     Temp Source 02/07/23 1736 Oral     SpO2 02/07/23 1736 97 %     Weight 02/07/23 1735 156 lb (70.8 kg)     Height 02/07/23 1735 5\' 8"  (1.727 m)     Head Circumference --      Peak Flow --      Pain Score 02/07/23 1735 10     Pain Loc --      Pain Edu? --      Excl. in GC? --     Most recent vital signs: Vitals:   02/07/23 1736  BP: (!) 136/96  Pulse: (!) 112  Resp: 20  Temp: 98.2 F (36.8 C)  SpO2: 97%    Constitutional: Alert and oriented. Eyes: Conjunctivae are normal. Head: Atraumatic. Nose: No congestion/rhinnorhea. Mouth/Throat: Mucous membranes are moist.  Cardiovascular: Tachycardic, regular rhythm. Grossly normal heart sounds.  2+ radial pulses bilaterally. Respiratory: Normal respiratory effort.  No retractions. Lungs CTAB. Gastrointestinal: Soft and nontender. No  distention. Musculoskeletal: No lower extremity tenderness nor edema.  Neurologic:  Normal speech and language. No gross focal neurologic deficits are appreciated.    ED Results / Procedures / Treatments   Labs (all labs ordered are listed, but only abnormal results are displayed) Labs Reviewed  BASIC METABOLIC PANEL - Abnormal; Notable for the following components:      Result Value   Sodium 127 (*)    Chloride 96 (*)    CO2 19 (*)    Glucose, Bld 568 (*)    All other components within normal limits  CBC - Abnormal; Notable for the following components:   WBC 17.4 (*)    Platelets 488 (*)    All other components within normal limits  URINALYSIS, ROUTINE W REFLEX MICROSCOPIC - Abnormal; Notable for the following components:   Color, Urine STRAW (*)    APPearance CLEAR (*)    Specific Gravity, Urine 1.032 (*)    Glucose, UA >=500 (*)    Protein, ur 30 (*)    All other components within normal limits  CBG MONITORING, ED - Abnormal; Notable for the following components:   Glucose-Capillary 599 (*)    All other components within normal limits  CBG  MONITORING, ED - Abnormal; Notable for the following components:   Glucose-Capillary 313 (*)    All other components within normal limits  RESP PANEL BY RT-PCR (RSV, FLU A&B, COVID)  RVPGX2  D-DIMER, QUANTITATIVE  TROPONIN I (HIGH SENSITIVITY)     EKG  ED ECG REPORT I, Chesley Noon, the attending physician, personally viewed and interpreted this ECG.   Date: 02/07/2023  EKG Time: 19:49  Rate: 91  Rhythm: normal sinus rhythm  Axis: Normal  Intervals:none  ST&T Change: LVH  RADIOLOGY Chest x-ray reviewed and interpreted by me with no infiltrate, edema, or effusion.  PROCEDURES:  Critical Care performed: No  Procedures   MEDICATIONS ORDERED IN ED: Medications  sodium chloride 0.9 % bolus 1,000 mL (0 mLs Intravenous Stopped 02/07/23 1823)  sodium chloride 0.9 % bolus 1,000 mL (0 mLs Intravenous Stopped 02/07/23  2051)  oxyCODONE-acetaminophen (PERCOCET/ROXICET) 5-325 MG per tablet 1 tablet (1 tablet Oral Given 02/07/23 1934)     IMPRESSION / MDM / ASSESSMENT AND PLAN / ED COURSE  I reviewed the triage vital signs and the nursing notes.                              47 y.o. male with past medical history of diabetes and GERD who presents to the ED complaining of weakness, malaise, cough, shortness of breath, nausea, headache, and dysuria for the past 2 days.  Patient's presentation is most consistent with acute presentation with potential threat to life or bodily function.  Differential diagnosis includes, but is not limited to, pneumonia, UTI, hyperglycemia, DKA, electrolyte abnormality, AKI, viral illness.  Patient nontoxic-appearing and in no acute distress, vital signs remarkable for tachycardia but otherwise reassuring.  Patient does appear clinically dehydrated with dry mucous membranes, likely contributing to his tachycardia.  Urinalysis shows no signs of infection and chest x-ray is unremarkable, low suspicion for sepsis and symptoms seem most consistent with viral illness.  Testing for COVID-19 and influenza pending at this time.  Labs remarkable for hyperglycemia but with no evidence of DKA given normal anion gap.  No acute electrolyte abnormality or AKI noted, patient does have leukocytosis but no significant anemia.  Troponin within normal limits and I doubt ACS, D-dimer also within normal limits and I doubt PE.  We will hydrate with IV fluids and recheck blood glucose.  Repeat blood glucose improving, patient feels better on reassessment however he is drinking a soda when I went back to check on him.  He was educated on need to watch his diet to minimize hyperglycemia, appropriate for outpatient management with PCP follow-up for any adjustment in his diabetic medications.  He was counseled to return to the ED for new or worsening symptoms, patient agrees with plan.      FINAL CLINICAL  IMPRESSION(S) / ED DIAGNOSES   Final diagnoses:  Hyperglycemia  Viral syndrome     Rx / DC Orders   ED Discharge Orders          Ordered    ondansetron (ZOFRAN-ODT) 4 MG disintegrating tablet  Every 8 hours PRN        02/07/23 2120             Note:  This document was prepared using Dragon voice recognition software and may include unintentional dictation errors.   Chesley Noon, MD 02/07/23 2123

## 2023-02-07 NOTE — ED Triage Notes (Addendum)
Pt via POV from home. Pt states he has been feeling weak, fatigue, nauseous, and having a headache. States that he went home after work and his meter read HIGH. Pt had recent medication changes was taken off of Metformin and placed on a different medication. Pt is Type II DM. Pt is A&OX4 and NAD, ambulatory to triage.   Wife has recently been sick with PNA and pt was recently on abx for a wound on the L foot.

## 2023-03-15 ENCOUNTER — Ambulatory Visit: Payer: BC Managed Care – PPO | Admitting: Urology

## 2023-03-16 ENCOUNTER — Encounter: Payer: Self-pay | Admitting: Urology

## 2023-12-12 ENCOUNTER — Emergency Department

## 2023-12-12 ENCOUNTER — Emergency Department
Admission: EM | Admit: 2023-12-12 | Discharge: 2023-12-12 | Disposition: A | Discharge status: Home / Self Care | Attending: Emergency Medicine | Admitting: Emergency Medicine

## 2023-12-12 ENCOUNTER — Encounter: Payer: Self-pay | Admitting: Emergency Medicine

## 2023-12-12 ENCOUNTER — Other Ambulatory Visit: Payer: Self-pay

## 2023-12-12 DIAGNOSIS — E871 Hypo-osmolality and hyponatremia: Secondary | ICD-10-CM | POA: Diagnosis not present

## 2023-12-12 DIAGNOSIS — G501 Atypical facial pain: Secondary | ICD-10-CM | POA: Diagnosis present

## 2023-12-12 DIAGNOSIS — E1165 Type 2 diabetes mellitus with hyperglycemia: Secondary | ICD-10-CM | POA: Diagnosis not present

## 2023-12-12 DIAGNOSIS — M542 Cervicalgia: Secondary | ICD-10-CM | POA: Diagnosis not present

## 2023-12-12 DIAGNOSIS — R739 Hyperglycemia, unspecified: Secondary | ICD-10-CM

## 2023-12-12 DIAGNOSIS — D72829 Elevated white blood cell count, unspecified: Secondary | ICD-10-CM | POA: Insufficient documentation

## 2023-12-12 LAB — CBC WITH DIFFERENTIAL/PLATELET
Abs Immature Granulocytes: 0.05 10*3/uL (ref 0.00–0.07)
Basophils Absolute: 0.1 10*3/uL (ref 0.0–0.1)
Basophils Relative: 1 %
Eosinophils Absolute: 0.3 10*3/uL (ref 0.0–0.5)
Eosinophils Relative: 2 %
HCT: 44.9 % (ref 39.0–52.0)
Hemoglobin: 15 g/dL (ref 13.0–17.0)
Immature Granulocytes: 0 %
Lymphocytes Relative: 24 %
Lymphs Abs: 3.3 10*3/uL (ref 0.7–4.0)
MCH: 30.7 pg (ref 26.0–34.0)
MCHC: 33.4 g/dL (ref 30.0–36.0)
MCV: 91.8 fL (ref 80.0–100.0)
Monocytes Absolute: 0.7 10*3/uL (ref 0.1–1.0)
Monocytes Relative: 5 %
Neutro Abs: 9 10*3/uL — ABNORMAL HIGH (ref 1.7–7.7)
Neutrophils Relative %: 68 %
Platelets: 355 10*3/uL (ref 150–400)
RBC: 4.89 MIL/uL (ref 4.22–5.81)
RDW: 13 % (ref 11.5–15.5)
WBC: 13.4 10*3/uL — ABNORMAL HIGH (ref 4.0–10.5)
nRBC: 0 % (ref 0.0–0.2)

## 2023-12-12 LAB — BASIC METABOLIC PANEL WITH GFR
Anion gap: 13 (ref 5–15)
BUN: 15 mg/dL (ref 6–20)
CO2: 19 mmol/L — ABNORMAL LOW (ref 22–32)
Calcium: 10 mg/dL (ref 8.9–10.3)
Chloride: 101 mmol/L (ref 98–111)
Creatinine, Ser: 0.98 mg/dL (ref 0.61–1.24)
GFR, Estimated: >60 mL/min (ref >60)
Glucose, Bld: 689 mg/dL (ref 70–99)
Potassium: 4.4 mmol/L (ref 3.5–5.1)
Sodium: 133 mmol/L — ABNORMAL LOW (ref 135–145)

## 2023-12-12 LAB — CBG MONITORING, ED: Glucose-Capillary: 233 mg/dL — ABNORMAL HIGH (ref 70–99)

## 2023-12-12 MED ORDER — TRAMADOL HCL 50 MG PO TABS
50.0000 mg | ORAL_TABLET | Freq: Once | ORAL | Status: AC
  Administered 2023-12-12: 50 mg via ORAL
  Filled 2023-12-12: qty 1

## 2023-12-12 MED ORDER — FENTANYL CITRATE PF 50 MCG/ML IJ SOSY
50.0000 ug | PREFILLED_SYRINGE | Freq: Once | INTRAMUSCULAR | Status: AC
  Administered 2023-12-12: 50 ug via INTRAVENOUS
  Filled 2023-12-12: qty 1

## 2023-12-12 MED ORDER — IOHEXOL 300 MG/ML  SOLN
75.0000 mL | Freq: Once | INTRAMUSCULAR | Status: AC | PRN
  Administered 2023-12-12: 75 mL via INTRAVENOUS

## 2023-12-12 MED ORDER — SODIUM CHLORIDE 0.9 % IV SOLN: Freq: Once | INTRAVENOUS | Status: AC

## 2023-12-12 MED ORDER — NAPROXEN 500 MG PO TABS: 500.0000 mg | ORAL_TABLET | Freq: Two times a day (BID) | ORAL | 2 refills | Status: AC

## 2023-12-12 MED ORDER — INSULIN ASPART 100 UNIT/ML IJ SOLN
10.0000 [IU] | Freq: Once | INTRAMUSCULAR | Status: AC
  Administered 2023-12-12: 10 [IU] via INTRAVENOUS
  Filled 2023-12-12: qty 1

## 2023-12-12 MED ORDER — KETOROLAC TROMETHAMINE 30 MG/ML IJ SOLN
30.0000 mg | Freq: Once | INTRAMUSCULAR | Status: AC
  Administered 2023-12-12: 30 mg via INTRAVENOUS
  Filled 2023-12-12: qty 1

## 2023-12-12 NOTE — ED Provider Notes (Signed)
 [facility] Provider Note    Event Date/Time   First MD Initiated Contact with Patient 12/12/23 1907     (approximate)   History   Facial Pain   HPI  Charles Beasley is a 48 y.o. male with a history of diabetes who presents with complaints of left-sided facial pain.  Patient describes pain radiating from the left side of his neck into his face.  He does report some dental pain but it is not severe.  He denies fever to me.  Reports he was in court today     Physical Exam   Triage Vital Signs: ED Triage Vitals [12/12/23 1818]  Encounter Vitals Group     BP (!) 153/87     Systolic BP Percentile      Diastolic BP Percentile      Pulse Rate 96     Resp 17     Temp 98.7 F (37.1 C)     Temp Source Oral     SpO2 100 %     Weight 68 kg (150 lb)     Height 1.753 m (5\' 9" )     Head Circumference      Peak Flow      Pain Score 8     Pain Loc      Pain Education      Exclude from Growth Chart     Most recent vital signs: Vitals:   12/12/23 1818 12/12/23 2220  BP: (!) 153/87 136/84  Pulse: 96 92  Resp: 17 17  Temp: 98.7 F (37.1 C)   SpO2: 100% 99%     General: Awake, no distress.  CV:  Good peripheral perfusion.  Resp:  Normal effort.  Abd:  No distention.  Other:  Patient has point tenderness along the left base of the skull which will significantly worsens his pain.,  No vertebral tenderness palpation pop, no pain with axial load.  Is able to flex the neck without discomfort.  No intraoral swelling or lesions   ED Results / Procedures / Treatments   Labs (all labs ordered are listed, but only abnormal results are displayed) Labs Reviewed  CBC WITH DIFFERENTIAL/PLATELET - Abnormal; Notable for the following components:      Result Value   WBC 13.4 (*)    Neutro Abs 9.0 (*)    All other components within normal limits  BASIC METABOLIC PANEL WITH GFR - Abnormal; Notable for the following components:   Sodium 133 (*)    CO2 19 (*)     Glucose, Bld 689 (*)    All other components within normal limits  CBG MONITORING, ED - Abnormal; Notable for the following components:   Glucose-Capillary 233 (*)    All other components within normal limits    EKG     RADIOLOGY CT soft tissue neck viewed interpreted by me, no evidence of abscess, pending radiology review    PROCEDURES:  Critical Care performed:   Procedures   MEDICATIONS ORDERED IN ED: Medications  0.9 %  sodium chloride  infusion (0 mLs Intravenous Stopped 12/12/23 2200)  insulin  aspart (novoLOG ) injection 10 Units (10 Units Intravenous Given 12/12/23 1950)  ketorolac  (TORADOL ) 30 MG/ML injection 30 mg (30 mg Intravenous Given 12/12/23 1936)  iohexol  (OMNIPAQUE ) 300 MG/ML solution 75 mL (75 mLs Intravenous Contrast Given 12/12/23 1940)  traMADol (ULTRAM) tablet 50 mg (50 mg Oral Given 12/12/23 2130)  fentaNYL  (SUBLIMAZE ) injection 50 mcg (50 mcg Intravenous Given 12/12/23 2217)  IMPRESSION / MDM / ASSESSMENT AND PLAN / ED COURSE  I reviewed the triage vital signs and the nursing notes. Patient's presentation is most consistent with acute illness / injury with system symptoms.  Patient presents with posterior left-sided neck pain as detailed above, appears to be most consistent with torticollis, muscular skeletal pain.  Possibility of odontogenic or pharyngeal infection but less likely.  Patient is markedly hyperglycemic, will give IV fluids, IV insulin   Mild elevation of white blood cell count is nonspecific  Will treat with IV Toradol , obtain CT soft tissue neck and reevaluate  ----------------------------------------- 10:46 PM on 12/12/2023 ----------------------------------------- Patient is feeling improved, CT scan is reassuring, glucose has improved to 233.  Appropriate for discharge with close outpatient follow-up, will Rx analgesics, return precautions discussed, he agrees with this plan.      FINAL CLINICAL IMPRESSION(S) / ED DIAGNOSES    Final diagnoses:  Neck pain  Hyperglycemia     Rx / DC Orders   ED Discharge Orders          Ordered    naproxen (NAPROSYN) 500 MG tablet  2 times daily with meals        12/12/23 2241             Note:  This document was prepared using Dragon voice recognition software and may include unintentional dictation errors.   Bryson Carbine, MD 12/12/23 2246

## 2023-12-12 NOTE — ED Notes (Signed)
 MD Pad informed of Glucose of 689

## 2023-12-12 NOTE — ED Notes (Signed)
 Pt states he started having left sided neck pain that began to radiate to the his head causing a severe headache. Pt states he has a HX of same and took his medication for it but it did not help. Pt states he is also having facial pain on the left side with the headache.  The pt denies any numbness or weakness to the left side of his body.

## 2023-12-12 NOTE — ED Triage Notes (Signed)
 Patient to ED via POV for left sided facial/neck pain. States he is unable to turn neck due to pain. Having headaches with the pain.  Fever at home-102 at the highest. Last took ibuprofen  at 8am.

## 2024-01-16 DIAGNOSIS — J45909 Unspecified asthma, uncomplicated: Secondary | ICD-10-CM | POA: Diagnosis not present

## 2024-01-16 DIAGNOSIS — Z7984 Long term (current) use of oral hypoglycemic drugs: Secondary | ICD-10-CM | POA: Diagnosis not present

## 2024-01-16 DIAGNOSIS — K130 Diseases of lips: Secondary | ICD-10-CM | POA: Insufficient documentation

## 2024-01-16 DIAGNOSIS — S99921A Unspecified injury of right foot, initial encounter: Secondary | ICD-10-CM | POA: Diagnosis present

## 2024-01-16 DIAGNOSIS — S0001XA Abrasion of scalp, initial encounter: Secondary | ICD-10-CM | POA: Diagnosis not present

## 2024-01-16 DIAGNOSIS — Z8551 Personal history of malignant neoplasm of bladder: Secondary | ICD-10-CM | POA: Diagnosis not present

## 2024-01-16 DIAGNOSIS — E119 Type 2 diabetes mellitus without complications: Secondary | ICD-10-CM | POA: Diagnosis not present

## 2024-01-16 DIAGNOSIS — W228XXA Striking against or struck by other objects, initial encounter: Secondary | ICD-10-CM | POA: Insufficient documentation

## 2024-01-16 DIAGNOSIS — S90111A Contusion of right great toe without damage to nail, initial encounter: Secondary | ICD-10-CM | POA: Diagnosis not present

## 2024-01-16 LAB — GROUP A STREP BY PCR: Group A Strep by PCR: NOT DETECTED

## 2024-01-16 NOTE — ED Triage Notes (Signed)
 Pt presents via POV with multiple complaints. Reports woke up this am with his lips swollen/tingling. Reports now he has a sore throat. Also report he had a bump on his head that his wife popped with discharge. Also reports infect left great toe after hitting his toe today.   Ambulatory to triage. A&O x4.

## 2024-01-17 ENCOUNTER — Emergency Department
Admission: EM | Admit: 2024-01-17 | Discharge: 2024-01-17 | Disposition: A | Discharge status: Home / Self Care | Attending: Emergency Medicine | Admitting: Emergency Medicine

## 2024-01-17 ENCOUNTER — Emergency Department

## 2024-01-17 DIAGNOSIS — S90111A Contusion of right great toe without damage to nail, initial encounter: Secondary | ICD-10-CM

## 2024-01-17 DIAGNOSIS — L0390 Cellulitis, unspecified: Secondary | ICD-10-CM

## 2024-01-17 MED ORDER — IBUPROFEN 800 MG PO TABS
800.0000 mg | ORAL_TABLET | Freq: Once | ORAL | Status: AC
  Administered 2024-01-17: 800 mg via ORAL
  Filled 2024-01-17: qty 1

## 2024-01-17 MED ORDER — AMOXICILLIN-POT CLAVULANATE 875-125 MG PO TABS: 1.0000 | ORAL_TABLET | Freq: Two times a day (BID) | ORAL | 0 refills | Status: DC

## 2024-01-17 MED ORDER — HYDROCODONE-ACETAMINOPHEN 5-325 MG PO TABS
1.0000 | ORAL_TABLET | Freq: Once | ORAL | Status: AC
  Administered 2024-01-17: 1 via ORAL
  Filled 2024-01-17: qty 1

## 2024-01-17 MED ORDER — AMOXICILLIN-POT CLAVULANATE 875-125 MG PO TABS
1.0000 | ORAL_TABLET | Freq: Once | ORAL | Status: AC
  Administered 2024-01-17: 1 via ORAL
  Filled 2024-01-17: qty 1

## 2024-01-17 NOTE — ED Provider Notes (Signed)
 [facility] Provider Note    Event Date/Time   First MD Initiated Contact with Patient 01/17/24 0057     (approximate)   History   Oral Swelling   HPI  Charles Beasley is a 48 y.o. male with history of diabetes, asthma, bladder cancer who presents to the emergency department with multiple complaints.  Patient states that he has a tender area to the top of his scalp.  He states his wife noticed a small white pustule/pimple.  She squeezed the area and reported that pus came out.  He feels like it is still slightly swollen and tender.  He reports having a fever of 101.  He also reports feeling like his throat was scratchy when he woke up this morning and felt like his lips were swollen, tingling.  He does report having a chicken preen his lips and he was worried about infection, Salmonella.  He denies any open wounds but is tender to the upper lip.  He denies any difficulty swallowing, speaking or breathing.  No new exposures.  No rash.   He also reports that he dropped a pallet on his right great toe yesterday.  It is painful, slightly bruised.  He is worried he is going to lose the toenail and he is also worried that it could be infected.   History provided by patient.    Past Medical History:  Diagnosis Date   Asthma    Bladder cancer (HCC)    Colitis    Diabetes mellitus without complication (HCC)    Diverticulosis    GERD (gastroesophageal reflux disease)    History of kidney stones    IBS (irritable bowel syndrome)     Past Surgical History:  Procedure Laterality Date   COLONOSCOPY WITH PROPOFOL  N/A 01/22/2020   Procedure: COLONOSCOPY WITH PROPOFOL ;  Surgeon: Irby Mannan, MD;  Location: [facility] ENDOSCOPY;  Service: Endoscopy;  Laterality: N/A;   ESOPHAGOGASTRODUODENOSCOPY (EGD) WITH PROPOFOL  N/A 01/22/2020   Procedure: ESOPHAGOGASTRODUODENOSCOPY (EGD) WITH PROPOFOL ;  Surgeon: Irby Mannan, MD;  Location: [facility] ENDOSCOPY;  Service:  Endoscopy;  Laterality: N/A;   EXTRACORPOREAL SHOCK WAVE LITHOTRIPSY Left 08/03/2022   Procedure: EXTRACORPOREAL SHOCK WAVE LITHOTRIPSY (ESWL);  Surgeon: Dustin Gimenez, MD;  Location: [facility] ORS;  Service: Urology;  Laterality: Left;    MEDICATIONS:  Prior to Admission medications   Medication Sig Start Date End Date Taking? Authorizing Provider  ACCU-CHEK GUIDE test strip 2 (two) times daily. for testing as directed 11/05/19   [provider]  Accu-Chek Softclix Lancets lancets 1 each 3 (three) times daily. 11/01/20   [provider]  busPIRone  (BUSPAR ) 7.5 MG tablet Take 7.5 mg by mouth 2 (two) times daily. 08/29/19   [provider]  citalopram  (CELEXA ) 20 MG tablet Take 20 mg by mouth daily. 11/05/19   [provider]  dicyclomine  (BENTYL ) 10 MG capsule Take 1 capsule (10 mg total) by mouth 3 (three) times daily as needed (abdominal pain). 12/30/22   Goodman, Graydon, MD  escitalopram (LEXAPRO) 20 MG tablet Take 20 mg by mouth daily. 06/20/22   [provider]  glipiZIDE (GLUCOTROL XL) 5 MG 24 hr tablet Take 5 mg by mouth daily. 09/02/19   [provider]  metFORMIN  (GLUCOPHAGE ) 1000 MG tablet Take 1,000 mg by mouth 2 (two) times daily. 08/19/19   [provider]  naproxen  (NAPROSYN ) 500 MG tablet Take 1 tablet (500 mg total) by mouth 2 (two) times daily with a meal. 12/12/23  Bryson Carbine, MD  ondansetron  (ZOFRAN -ODT) 4 MG disintegrating tablet Take 1 tablet (4 mg total) by mouth every 8 (eight) hours as needed for nausea or vomiting. 02/07/23   Twilla Galea, MD  oxybutynin  (DITROPAN -XL) 10 MG 24 hr tablet Take 1 tablet (10 mg total) by mouth daily. 01/04/22   Lawerence Pressman, MD  pantoprazole  (PROTONIX ) 40 MG tablet Take 40 mg by mouth daily. 03/30/22   [provider]  propranolol  (INDERAL ) 40 MG tablet Take 1 tablet (40 mg total) by mouth 2 (two) times daily. 10/07/22 12/06/22  Menshew, Raye Cai, PA-C  simvastatin  (ZOCOR ) 20  MG tablet Take 20 mg by mouth at bedtime. 08/16/19   [provider]  tamsulosin  (FLOMAX ) 0.4 MG CAPS capsule Take 1 capsule (0.4 mg total) by mouth daily. 08/03/22   Dustin Gimenez, MD    Physical Exam   Triage Vital Signs: ED Triage Vitals [01/16/24 2251]  Encounter Vitals Group     BP (!) 154/87     Girls Systolic BP Percentile      Girls Diastolic BP Percentile      Boys Systolic BP Percentile      Boys Diastolic BP Percentile      Pulse Rate (!) 104     Resp 16     Temp 98.2 F (36.8 C)     Temp Source Oral     SpO2 96 %     Weight      Height      Head Circumference      Peak Flow      Pain Score      Pain Loc      Pain Education      Exclude from Growth Chart     Most recent vital signs: Vitals:   01/16/24 2251 01/17/24 0209  BP: (!) 154/87 117/78  Pulse: (!) 104 89  Resp: 16 16  Temp: 98.2 F (36.8 C) 97.7 F (36.5 C)  SpO2: 96%     CONSTITUTIONAL: Alert, responds appropriately to questions. Well-appearing; well-nourished HEAD: Normocephalic, atraumatic, superficial abrasions noted to the top of the scalp without increased redness, warmth and no induration or fluctuance. EYES: Conjunctivae clear, pupils appear equal, sclera nonicteric ENT: normal nose; moist mucous membranes, upper lip is slightly tender to palpation but no significant angioedema.  No fluctuance or induration noted.  No open wounds.  Poor dentition.  No Ludwig's angina.  Tongue sits flat in the bottom of the mouth.  Normal phonation.  No trismus, drooling.  Airway patent.  No tonsillar hypertrophy, exudate.  No uvular deviation.  No swelling of the uvula. NECK: Supple, normal ROM CARD: Regular and slightly tachycardic; S1 and S2 appreciated RESP: Normal chest excursion without splinting or tachypnea; breath sounds clear and equal bilaterally; no wheezes, no rhonchi, no rales, no hypoxia or respiratory distress, speaking full sentences ABD/GI: Non-distended; soft, non-tender, no rebound,  no guarding, no peritoneal signs BACK: The back appears normal EXT: Normal ROM in all joints; patient has tenderness, mild bruising noted to the right great toe.  He has onychomycotic toenails bilaterally but no signs of increased redness, warmth, induration, fluctuance.  No open wounds to the feet.  2+ DP pulses bilaterally.  No tenderness over the right ankle. SKIN: Normal color for age and race; warm; no rash on exposed skin NEURO: Moves all extremities equally, normal speech PSYCH: The patient's mood and manner are appropriate.   ED Results / Procedures / Treatments   LABS: (all labs  ordered are listed, but only abnormal results are displayed) Labs Reviewed  GROUP A STREP BY PCR     EKG:  EKG Interpretation Date/Time:    Ventricular Rate:    PR Interval:    QRS Duration:    QT Interval:    QTC Calculation:   R Axis:      Text Interpretation:           RADIOLOGY: My personal review and interpretation of imaging: X-ray of the right foot shows no acute abnormality.  I have personally reviewed all radiology reports.   DG Foot Complete Right Result Date: 01/17/2024 CLINICAL DATA:  Status post trauma with subsequent right great toe pain and. EXAM: RIGHT FOOT COMPLETE - 3+ VIEW COMPARISON:  None Available. FINDINGS: There is no evidence of an acute fracture or dislocation. There is no evidence of arthropathy or other focal bone abnormality. Soft tissues are unremarkable. IMPRESSION: Negative. Electronically Signed   By: Virgle Grime M.D.   On: 01/17/2024 01:44     PROCEDURES:  Critical Care performed: No     Procedures    IMPRESSION / MDM / ASSESSMENT AND PLAN / ED COURSE  I reviewed the triage vital signs and the nursing notes.    Patient here with multiple complaints.    DIFFERENTIAL DIAGNOSIS (includes but not limited to):   Developing cellulitis to the lip, no obvious sign of abscess, doubt angioedema or allergic reaction, tonsillitis, strep  pharyngitis, viral pharyngitis, no signs of deep space neck infection or PTA, suspect contusion to the right foot versus fracture of the toe.  No signs of cellulitis of the leg, foot.  No sign of compartment syndrome, gout, DVT.   Patient's presentation is most consistent with acute complicated illness / injury requiring diagnostic workup.   PLAN: Will start patient on Augmentin .  Discussed with him that this would cover for any potential bites that he could have obtained from the chicken as well as Salmonella.  This would also cover any oral flora that could be causing developing cellulitis of the lips especially given poor dentition on exam.  No sign of any drainable abscess.  His strep test is negative but discussed with him that Augmentin  would also cover for bacterial causes of pharyngitis.  He has no signs of tonsillitis, uvulitis, deep space neck infection, PTA on exam.  His scalp appears normal with just superficial abrasions but no signs of cellulitis, abscess.  Will obtain x-ray of the right foot.  He has no sign of infection, gout, septic arthritis, DVT, arterial obstruction, compartment syndrome.  Will give pain medication here.   MEDICATIONS GIVEN IN ED: Medications  ibuprofen  (ADVIL ) tablet 800 mg (800 mg Oral Given 01/17/24 0115)  amoxicillin -clavulanate (AUGMENTIN ) 875-125 MG per tablet 1 tablet (1 tablet Oral Given 01/17/24 0115)  HYDROcodone -acetaminophen  (NORCO/VICODIN) 5-325 MG per tablet 1 tablet (1 tablet Oral Given 01/17/24 0205)     ED COURSE: X-ray reviewed and interpreted by myself and radiologist and shows no acute abnormality.  Recommended weightbearing as tolerated.  Recommended Tylenol , Motrin  as needed for pain, rest, elevation and ice.  Will provide with work note for a couple of days.  Will discharge with antibiotics.   At this time, I do not feel there is any life-threatening condition present. I reviewed all nursing notes, vitals, pertinent previous records.   All lab and urine results, EKGs, imaging ordered have been independently reviewed and interpreted by myself.  I reviewed all available radiology reports from any imaging ordered  this visit.  Based on my assessment, I feel the patient is safe to be discharged home without further emergent workup and can continue workup as an outpatient as needed. Discussed all findings, treatment plan as well as usual and customary return precautions.  They verbalize understanding and are comfortable with this plan.  Outpatient follow-up has been provided as needed.  All questions have been answered.    CONSULTS:  none   OUTSIDE RECORDS REVIEWED: Reviewed last office visit on 01/04/2023 with his PCP.       FINAL CLINICAL IMPRESSION(S) / ED DIAGNOSES   Final diagnoses:  Cellulitis, unspecified cellulitis site  Contusion of right great toe without damage to nail, initial encounter     Rx / DC Orders   ED Discharge Orders          Ordered    amoxicillin -clavulanate (AUGMENTIN ) 875-125 MG tablet  2 times daily        01/17/24 0208             Note:  This document was prepared using Dragon voice recognition software and may include unintentional dictation errors.   Jibran Crookshanks, Clover Dao, DO 01/17/24 [phone]

## 2024-01-17 NOTE — Discharge Instructions (Addendum)
 You may alternate over the counter Tylenol  1000 mg every 6 hours as needed for pain, fever and Ibuprofen  800 mg every 6-8 hours as needed for pain, fever.  Please take Ibuprofen  with food.  Do not take more than 4000 mg of Tylenol  (acetaminophen ) in a 24 hour period.  Your x-ray showed no fracture of your foot, toe.  I recommend rest, elevation, ice.  You may weight-bear as tolerated.  Please take your antibiotics until complete.  We are treating you for potential developing cellulitis to the lip that would cover for normal bacteria that involves the mouth, lip but also cover for Salmonella given exposure to chickens.

## 2024-07-22 DIAGNOSIS — R634 Abnormal weight loss: Secondary | ICD-10-CM

## 2024-07-22 DIAGNOSIS — E1165 Type 2 diabetes mellitus with hyperglycemia: Secondary | ICD-10-CM

## 2024-07-22 DIAGNOSIS — C679 Malignant neoplasm of bladder, unspecified: Secondary | ICD-10-CM

## 2024-07-23 ENCOUNTER — Ambulatory Visit

## 2024-08-06 NOTE — Progress Notes (Signed)
 [facility] [employer] was not able to reach the patient by phone call.  The details of interventions are as follows:     08/06/2024    4:25 PM  [facility]  Pharmacy Support Interventions Unable to reach-- reminder message sent  Comments: LVM 1st attempt    [facility]    [address]; [place], [place] [address] l  [email or web address] l [phone]   For more information on [facility] services, click here.

## 2024-08-07 NOTE — Progress Notes (Signed)
 [facility] [employer] was not able to reach the patient by phone call.  The details of interventions are as follows:     08/07/2024    8:36 AM  Gap [facility]  Pharmacy Support Interventions Unable to reach-- reminder message sent  Comments: LVM msg sent    [facility]    [address]; [place], [place] [address] l  [email or web address] l [phone]   For more information on [facility] services, click here.

## 2024-08-11 ENCOUNTER — Emergency Department

## 2024-08-11 ENCOUNTER — Emergency Department
Admission: EM | Admit: 2024-08-11 | Discharge: 2024-08-11 | Disposition: A | Discharge status: Home / Self Care | Attending: Emergency Medicine | Admitting: Emergency Medicine

## 2024-08-11 ENCOUNTER — Other Ambulatory Visit: Payer: Self-pay

## 2024-08-11 ENCOUNTER — Encounter: Payer: Self-pay | Admitting: Emergency Medicine

## 2024-08-11 DIAGNOSIS — N433 Hydrocele, unspecified: Secondary | ICD-10-CM | POA: Insufficient documentation

## 2024-08-11 DIAGNOSIS — E119 Type 2 diabetes mellitus without complications: Secondary | ICD-10-CM | POA: Diagnosis not present

## 2024-08-11 DIAGNOSIS — Z7984 Long term (current) use of oral hypoglycemic drugs: Secondary | ICD-10-CM | POA: Diagnosis not present

## 2024-08-11 DIAGNOSIS — N50812 Left testicular pain: Secondary | ICD-10-CM | POA: Diagnosis present

## 2024-08-11 LAB — CBC
HCT: 43.2 % (ref 39.0–52.0)
Hemoglobin: 14.5 g/dL (ref 13.0–17.0)
MCH: 30.3 pg (ref 26.0–34.0)
MCHC: 33.6 g/dL (ref 30.0–36.0)
MCV: 90.2 fL (ref 80.0–100.0)
Platelets: 453 K/uL — ABNORMAL HIGH (ref 150–400)
RBC: 4.79 MIL/uL (ref 4.22–5.81)
RDW: 13.1 % (ref 11.5–15.5)
WBC: 11.7 K/uL — ABNORMAL HIGH (ref 4.0–10.5)
nRBC: 0 % (ref 0.0–0.2)

## 2024-08-11 LAB — COMPREHENSIVE METABOLIC PANEL WITH GFR
ALT: 9 U/L (ref 0–44)
AST: 14 U/L — ABNORMAL LOW (ref 15–41)
Albumin: 4.6 g/dL (ref 3.5–5.0)
Alkaline Phosphatase: 90 U/L (ref 38–126)
Anion gap: 15 (ref 5–15)
BUN: 18 mg/dL (ref 6–20)
CO2: 19 mmol/L — ABNORMAL LOW (ref 22–32)
Calcium: 9.8 mg/dL (ref 8.9–10.3)
Chloride: 103 mmol/L (ref 98–111)
Creatinine, Ser: 0.81 mg/dL (ref 0.61–1.24)
GFR, Estimated: >60 mL/min
Glucose, Bld: 145 mg/dL — ABNORMAL HIGH (ref 70–99)
Potassium: 4.1 mmol/L (ref 3.5–5.1)
Sodium: 137 mmol/L (ref 135–145)
Total Bilirubin: 0.4 mg/dL (ref 0.0–1.2)
Total Protein: 7.9 g/dL (ref 6.5–8.1)

## 2024-08-11 LAB — URINALYSIS, ROUTINE W REFLEX MICROSCOPIC
Bilirubin Urine: NEGATIVE
Glucose, UA: 150 mg/dL — AB
Hgb urine dipstick: NEGATIVE
Ketones, ur: NEGATIVE mg/dL
Leukocytes,Ua: NEGATIVE
Nitrite: NEGATIVE
Protein, ur: NEGATIVE mg/dL
Specific Gravity, Urine: 1.028 (ref 1.005–1.030)
pH: 5 (ref 5.0–8.0)

## 2024-08-11 LAB — CHLAMYDIA/NGC RT PCR (ARMC ONLY)
Chlamydia Tr: NOT DETECTED
N gonorrhoeae: NOT DETECTED

## 2024-08-11 LAB — LIPASE, BLOOD: Lipase: 27 U/L (ref 11–51)

## 2024-08-11 MED ORDER — MORPHINE SULFATE (PF) 4 MG/ML IV SOLN
4.0000 mg | Freq: Once | INTRAVENOUS | Status: AC
  Administered 2024-08-11: 4 mg via INTRAMUSCULAR
  Filled 2024-08-11: qty 1

## 2024-08-11 MED ORDER — OXYCODONE HCL 5 MG PO TABS
5.0000 mg | ORAL_TABLET | Freq: Once | ORAL | Status: AC
  Administered 2024-08-11: 5 mg via ORAL
  Filled 2024-08-11: qty 1

## 2024-08-11 MED ORDER — MORPHINE SULFATE (PF) 4 MG/ML IV SOLN
4.0000 mg | Freq: Once | INTRAVENOUS | Status: AC
  Administered 2024-08-11: 4 mg via INTRAVENOUS
  Filled 2024-08-11: qty 1

## 2024-08-11 MED ORDER — LEVOFLOXACIN 250 MG PO TABS: 500.0000 mg | ORAL_TABLET | Freq: Every day | ORAL | 0 refills | Status: AC

## 2024-08-11 MED ORDER — CELECOXIB 200 MG PO CAPS: 200.0000 mg | ORAL_CAPSULE | Freq: Two times a day (BID) | ORAL | 0 refills | Status: AC

## 2024-08-11 MED ORDER — CEFTRIAXONE SODIUM 1 G IJ SOLR
500.0000 mg | Freq: Once | INTRAMUSCULAR | Status: AC
  Administered 2024-08-11: 500 mg via INTRAMUSCULAR
  Filled 2024-08-11: qty 10

## 2024-08-11 MED ORDER — IOHEXOL 300 MG/ML  SOLN
100.0000 mL | Freq: Once | INTRAMUSCULAR | Status: AC | PRN
  Administered 2024-08-11: 100 mL via INTRAVENOUS

## 2024-08-11 MED ORDER — LEVOFLOXACIN 500 MG PO TABS
500.0000 mg | ORAL_TABLET | Freq: Once | ORAL | Status: AC
  Administered 2024-08-11: 500 mg via ORAL
  Filled 2024-08-11: qty 1

## 2024-08-11 MED ORDER — KETOROLAC TROMETHAMINE 30 MG/ML IJ SOLN
30.0000 mg | Freq: Once | INTRAMUSCULAR | Status: AC
  Administered 2024-08-11: 30 mg via INTRAVENOUS
  Filled 2024-08-11: qty 1

## 2024-08-11 NOTE — ED Notes (Signed)
 OK with Dr Ernest for US  to be ordered d/t wait times.

## 2024-08-11 NOTE — ED Provider Notes (Signed)
 " [place] EMERGENCY DEPARTMENT AT [facility] Provider Note   CSN: [id number] Arrival date & time: 08/11/24  1532     Patient presents with: Testicle Pain   Charles Beasley is a 49 y.o. male.  With history of diabetes, UTI, IBS, reflux presents to the emergency department for evaluation of 4 days of left-sided scrotal pain with testicular pain.  Denies any trauma or injury.  4 days ago developed some pain and discomfort.  No swelling warmth redness or fevers.  No dysuria or hematuria.  No numbness tingling or radicular symptoms.  Denies any open wounds or drainage   Patient also reports several months of fevers, fatigue and weight loss with no known cause.  PCP has ordered a CT of the chest abdomen pelvis but not scheduled until the end of February.  Family is concerned about his wellbeing as he has been declining rapidly    HPI     Prior to Admission medications  Medication Sig Start Date End Date Taking? Authorizing Provider  celecoxib  (CELEBREX ) 200 MG capsule Take 1 capsule (200 mg total) by mouth 2 (two) times daily for 10 days. 08/11/24 08/21/24 Yes Charlene Debby BROCKS, PA-C  levofloxacin  (LEVAQUIN ) 250 MG tablet Take 2 tablets (500 mg total) by mouth daily for 9 days. 08/11/24 08/20/24 Yes Charlene Debby BROCKS, PA-C  ACCU-CHEK GUIDE test strip 2 (two) times daily. for testing as directed 11/05/19   [provider]  Accu-Chek Softclix Lancets lancets 1 each 3 (three) times daily. 11/01/20   [provider]  amoxicillin -clavulanate (AUGMENTIN ) 875-125 MG tablet Take 1 tablet by mouth 2 (two) times daily. 01/17/24   Ward, Josette SAILOR, DO  busPIRone  (BUSPAR ) 7.5 MG tablet Take 7.5 mg by mouth 2 (two) times daily. 08/29/19   [provider]  citalopram  (CELEXA ) 20 MG tablet Take 20 mg by mouth daily. 11/05/19   [provider]  dicyclomine  (BENTYL ) 10 MG capsule Take 1 capsule (10 mg total) by mouth 3 (three) times daily as needed (abdominal pain). 12/30/22    Goodman, Graydon, MD  escitalopram (LEXAPRO) 20 MG tablet Take 20 mg by mouth daily. 06/20/22   [provider]  glipiZIDE (GLUCOTROL XL) 5 MG 24 hr tablet Take 5 mg by mouth daily. 09/02/19   [provider]  metFORMIN  (GLUCOPHAGE ) 1000 MG tablet Take 1,000 mg by mouth 2 (two) times daily. 08/19/19   [provider]  naproxen  (NAPROSYN ) 500 MG tablet Take 1 tablet (500 mg total) by mouth 2 (two) times daily with a meal. 12/12/23   Arlander Charleston, MD  ondansetron  (ZOFRAN -ODT) 4 MG disintegrating tablet Take 1 tablet (4 mg total) by mouth every 8 (eight) hours as needed for nausea or vomiting. 02/07/23   Willo Dunnings, MD  oxybutynin  (DITROPAN -XL) 10 MG 24 hr tablet Take 1 tablet (10 mg total) by mouth daily. 01/04/22   Francisca Redell BROCKS, MD  pantoprazole  (PROTONIX ) 40 MG tablet Take 40 mg by mouth daily. 03/30/22   [provider]  propranolol  (INDERAL ) 40 MG tablet Take 1 tablet (40 mg total) by mouth 2 (two) times daily. 10/07/22 12/06/22  Menshew, Candida LULLA Kings, PA-C  simvastatin  (ZOCOR ) 20 MG tablet Take 20 mg by mouth at bedtime. 08/16/19   [provider]  tamsulosin  (FLOMAX ) 0.4 MG CAPS capsule Take 1 capsule (0.4 mg total) by mouth daily. 08/03/22   Penne Knee, MD    Allergies: Montelukast, Sertraline, and Tamsulosin     Review of Systems  Updated Vital  Signs BP 131/77   Pulse 98   Temp 97.7 F (36.5 C) (Oral)   Resp 18   Ht 5' 9 (1.753 m)   Wt 68 kg   SpO2 96%   BMI 22.14 kg/m   Physical Exam Constitutional:      Appearance: He is well-developed.  HENT:     Head: Normocephalic and atraumatic.  Eyes:     Conjunctiva/sclera: Conjunctivae normal.  Cardiovascular:     Rate and Rhythm: Normal rate.  Pulmonary:     Effort: Pulmonary effort is normal. No respiratory distress.  Abdominal:     General: There is no distension.     Tenderness: There is no abdominal tenderness. There is no guarding.  Genitourinary:    Comments: Left  scrotum and testicular area shows no overall swelling warmth or redness.  He has some tenderness to the left side of the scrotum, there is no induration.  No skin color changes nor signs of skin breakdown.  No induration or abscess formation.  Testicle appears to be normal in size on the left side with apparent the right.  Minimal testicular tenderness but no epididymal masses.  His hip moves well with internal ex rotation with no discomfort. Musculoskeletal:        General: Normal range of motion.     Cervical back: Normal range of motion.  Skin:    General: Skin is warm.     Capillary Refill: Capillary refill takes less than 2 seconds.     Findings: No rash.  Neurological:     General: No focal deficit present.     Mental Status: He is alert and oriented to person, place, and time.  Psychiatric:        Behavior: Behavior normal.        Thought Content: Thought content normal.     (all labs ordered are listed, but only abnormal results are displayed) Labs Reviewed  CBC - Abnormal; Notable for the following components:      Result Value   WBC 11.7 (*)    Platelets 453 (*)    All other components within normal limits  URINALYSIS, ROUTINE W REFLEX MICROSCOPIC - Abnormal; Notable for the following components:   Color, Urine YELLOW (*)    APPearance CLEAR (*)    Glucose, UA 150 (*)    All other components within normal limits  COMPREHENSIVE METABOLIC PANEL WITH GFR - Abnormal; Notable for the following components:   CO2 19 (*)    Glucose, Bld 145 (*)    AST 14 (*)    All other components within normal limits  URINE CULTURE  CHLAMYDIA/NGC RT PCR ([facility] ONLY)            LIPASE, BLOOD    EKG: None  Radiology: CT CHEST ABDOMEN PELVIS W CONTRAST Result Date: 08/11/2024 EXAM: CT CHEST, ABDOMEN AND PELVIS WITH CONTRAST 08/11/2024 09:31:56 PM TECHNIQUE: CT of the chest, abdomen and pelvis was performed with the administration of 100 mL iohexol  (OMNIPAQUE ) 300 MG/ML solution.  Multiplanar reformatted images are provided for review. Automated exposure control, iterative reconstruction, and/or weight based adjustment of the mA/kV was utilized to reduce the radiation dose to as low as reasonably achievable. COMPARISON: 12/30/2022 CLINICAL HISTORY: Fevers, weight loss, pelvic pain, testicular pain and swelling, lethargy, unspecified abdominal pain, urinary incontinence. FINDINGS: CHEST: MEDIASTINUM AND LYMPH NODES: Heart and pericardium are unremarkable. The central airways are clear. No mediastinal, hilar or axillary lymphadenopathy. There is thickening of the esophagus diffusely which is  nonspecific but may relate to underlying infectious or inflammatory esophagitis. This could be better assessed with endoscopy if indicated. No evidence of obstruction. LUNGS AND PLEURA: Mild emphysema. No focal consolidation or pulmonary edema. No pleural effusion. No pneumothorax. ABDOMEN AND PELVIS: LIVER: Moderate hepatic steatosis. No intrahepatic mass. No intrahepatic biliary ductal dilation. GALLBLADDER AND BILE DUCTS: Unremarkable. No biliary ductal dilatation. SPLEEN: No acute abnormality. PANCREAS: No acute abnormality. ADRENAL GLANDS: No acute abnormality. KIDNEYS, URETERS AND BLADDER: 6 mm nonobstructing calculus noted within the lower pole of the left kidney. No ureteral calculi. No hydronephrosis. No perinephric inflammatory stranding or fluid collections were identified. The bladder is unremarkable. GI AND BOWEL: Stomach demonstrates no acute abnormality. Moderate sigmoid diverticulosis without superimposed acute inflammatory change. Moderate colonic stool burden without evidence of obstruction. There is no bowel obstruction. REPRODUCTIVE ORGANS: No acute abnormality. PERITONEUM AND RETROPERITONEUM: No ascites. No free air. VASCULATURE: Aorta is normal in caliber. Mild aortoiliac atherosclerotic calcification. No aortic aneurysm. ABDOMINAL AND PELVIS LYMPH NODES: No lymphadenopathy. BONES AND  SOFT TISSUES: No acute osseous abnormality. No focal soft tissue abnormality. IMPRESSION: 1. Nonspecific diffuse esophageal thickening, which may relate to infectious or inflammatory esophagitis; no evidence of obstruction, and endoscopy may be considered for further evaluation. 2. Mild emphysema (icd10-j43.9). Low-dose CT lung cancer screening is recommended for patients who are 23-32 years of age with a 20+ pack-year history of smoking and who are currently smoking or quit 15 years ago 3. Moderate hepatic steatosis. 4. 6 mm nonobstructing calculus in the lower pole of the left kidney. 5. Moderate sigmoid diverticulosis without superimposed acute inflammatory change. 6. Moderate colonic stool burden without evidence of obstruction. 7. RAF score includes aortic atherosclerosis (ICD10-I70.0) and emphysema (ICD10-J43.9). Electronically signed by: Dorethia Molt MD MD 08/11/2024 10:03 PM EST RP Workstation: [id number]   US  SCROTUM W/DOPPLER Result Date: 08/11/2024 EXAM: ULTRASOUND SCROTUM/TESTICLES WITH DOPPLER FLOW EVALUATION 08/11/2024 05:26:38 PM TECHNIQUE: Ultrasound using B-mode/gray scaled imaging, Doppler spectral analysis and color flow Doppler was obtained of the scrotum. COMPARISON: None available. CLINICAL HISTORY: Testicle pain and swelling. FINDINGS: RIGHT: GREY SCALE: The right testicle measures 4.4 x 1.5 x 2.1 cm. It demonstrates normal homogeneous echotexture without focal lesion. No testicular microlithiasis. DOPPLER EVALUATION: There is normal arterial and venous Doppler flow within the testicle. VARICOCELE: No scrotal varicocele. SCROTAL SAC: Small hydrocele. EPIDIDYMIS: No acute abnormality. LEFT: GREY SCALE: The left testicle measures 3.4 x 1.7 x 2.6 cm. It demonstrates normal homogeneous echotexture without focal lesion. No testicular microlithiasis. Likely scrotolith in the left testicle. DOPPLER EVALUATION: There is normal arterial and venous Doppler flow within the testicle. VARICOCELE: No  scrotal varicocele. SCROTAL SAC: Small hydrocele, larger than on the right. EPIDIDYMIS: No acute abnormality. IMPRESSION: 1. Small bilateral hydroceles, larger on the left than the right. Otherwise, no testicular mass, epididymoorchitis, or findings of testicular torsion at this time. Electronically signed by: Rogelia Myers MD MD 08/11/2024 06:31 PM EST RP Workstation: [id number]     Procedures   Medications Ordered in the ED  cefTRIAXone  (ROCEPHIN ) injection 500 mg (has no administration in time range)  levofloxacin  (LEVAQUIN ) tablet 500 mg (has no administration in time range)  morphine  (PF) 4 MG/ML injection 4 mg (4 mg Intramuscular Given 08/11/24 1753)  morphine  (PF) 4 MG/ML injection 4 mg (4 mg Intravenous Given 08/11/24 2104)  ketorolac  (TORADOL ) 30 MG/ML injection 30 mg (30 mg Intravenous Given 08/11/24 2107)  iohexol  (OMNIPAQUE ) 300 MG/ML solution 100 mL (100 mLs Intravenous Contrast Given 08/11/24 2127)  Medical Decision Making Amount and/or Complexity of Data Reviewed Labs: ordered. Radiology: ordered.  Risk Prescription drug management.   49 year old male with left-sided scrotal pain.  Also reported some weight loss, fatigue.  Afebrile nontachycardic stable vital signs.  No signs of sepsis.  White blood cell count 11.7, BMP normal.  Ultrasound of the scrotum showed hydroceles left greater than right.  No signs of infection on ultrasound no urinary symptoms and no signs of urinary tract infection.  Urine culture obtained and urine gonorrhea chlamydia test pending.  Patient currently being evaluated by PCP, some concern for recent fatigue, chronic weight loss and a CT chest abdomen pelvis was ordered but not scheduled for 6 weeks.  They are requesting this be done today which is reasonable.  CT chest abdomen pelvis showed nonspecific esophagitis, no difficulty swallowing, history of chronic reflux.  No other abnormal findings except for mild  emphysema.  Due to left-sided scrotal pain with hydrocele will treat with NSAIDs.  Although laboratory and ultrasound findings do not show a definitive infection due to his pain I will treat empirically with Rocephin  and Levaquin .  He is given strict return precautions and understands signs symptoms return to the ED for.  Final diagnoses:  Hydrocele in adult    ED Discharge Orders          Ordered    celecoxib  (CELEBREX ) 200 MG capsule  2 times daily        08/11/24 2225    levofloxacin  (LEVAQUIN ) 250 MG tablet  Daily        08/11/24 2225               Caidance Sybert C, PA-C 08/11/24 2230    Waymond Lorelle Cummins, MD 08/11/24 2345  "

## 2024-08-11 NOTE — ED Triage Notes (Signed)
 Pt reports testicle pain and swelling. States he has been out of work for a few weeks due to lethargy and abd pain started 4 days ago. Pt started on insulin  3 weeks ago. Also having some urinary incontinence.

## 2024-08-11 NOTE — Discharge Instructions (Addendum)
 Please take NSAIDs as prescribed.  Return to the ER if you develop any fevers, urinary symptoms or any urgent changes in your health.  Please follow-up with PCP or GI specialist about esophagitis.  Please continue with Protonix  daily.

## 2024-08-13 LAB — URINE CULTURE
Culture: NO GROWTH
Special Requests: NORMAL

## 2024-09-02 ENCOUNTER — Other Ambulatory Visit: Payer: Self-pay

## 2024-09-02 ENCOUNTER — Emergency Department

## 2024-09-02 ENCOUNTER — Emergency Department
Admission: EM | Admit: 2024-09-02 | Discharge: 2024-09-02 | Disposition: A | Discharge status: Home / Self Care | Attending: Emergency Medicine | Admitting: Emergency Medicine

## 2024-09-02 DIAGNOSIS — D72829 Elevated white blood cell count, unspecified: Secondary | ICD-10-CM | POA: Insufficient documentation

## 2024-09-02 DIAGNOSIS — Z8551 Personal history of malignant neoplasm of bladder: Secondary | ICD-10-CM | POA: Insufficient documentation

## 2024-09-02 DIAGNOSIS — E119 Type 2 diabetes mellitus without complications: Secondary | ICD-10-CM | POA: Insufficient documentation

## 2024-09-02 DIAGNOSIS — J45909 Unspecified asthma, uncomplicated: Secondary | ICD-10-CM | POA: Insufficient documentation

## 2024-09-02 DIAGNOSIS — N4822 Cellulitis of corpus cavernosum and penis: Secondary | ICD-10-CM | POA: Insufficient documentation

## 2024-09-02 LAB — BASIC METABOLIC PANEL WITH GFR
Anion gap: 13 (ref 5–15)
BUN: 13 mg/dL (ref 6–20)
CO2: 24 mmol/L (ref 22–32)
Calcium: 8.9 mg/dL (ref 8.9–10.3)
Chloride: 104 mmol/L (ref 98–111)
Creatinine, Ser: 0.83 mg/dL (ref 0.61–1.24)
GFR, Estimated: >60 mL/min
Glucose, Bld: 410 mg/dL — ABNORMAL HIGH (ref 70–99)
Potassium: 4.3 mmol/L (ref 3.5–5.1)
Sodium: 140 mmol/L (ref 135–145)

## 2024-09-02 LAB — CBC
HCT: 41.1 % (ref 39.0–52.0)
Hemoglobin: 13.7 g/dL (ref 13.0–17.0)
MCH: 30 pg (ref 26.0–34.0)
MCHC: 33.3 g/dL (ref 30.0–36.0)
MCV: 89.9 fL (ref 80.0–100.0)
Platelets: 365 10*3/uL (ref 150–400)
RBC: 4.57 MIL/uL (ref 4.22–5.81)
RDW: 13.4 % (ref 11.5–15.5)
WBC: 13.2 10*3/uL — ABNORMAL HIGH (ref 4.0–10.5)
nRBC: 0 % (ref 0.0–0.2)

## 2024-09-02 LAB — URINALYSIS, ROUTINE W REFLEX MICROSCOPIC
Bacteria, UA: NONE SEEN
Bilirubin Urine: NEGATIVE
Glucose, UA: >=500 mg/dL — AB
Ketones, ur: 5 mg/dL — AB
Leukocytes,Ua: NEGATIVE
Nitrite: NEGATIVE
Protein, ur: NEGATIVE mg/dL
Specific Gravity, Urine: 1.03 (ref 1.005–1.030)
pH: 5 (ref 5.0–8.0)

## 2024-09-02 LAB — LACTIC ACID, PLASMA: Lactic Acid, Venous: 0.9 mmol/L (ref 0.5–1.9)

## 2024-09-02 MED ORDER — DOXYCYCLINE HYCLATE 100 MG PO TABS
100.0000 mg | ORAL_TABLET | Freq: Once | ORAL | Status: AC
  Administered 2024-09-02: 100 mg via ORAL
  Filled 2024-09-02: qty 1

## 2024-09-02 MED ORDER — HYDROMORPHONE HCL 1 MG/ML IJ SOLN
0.5000 mg | Freq: Once | INTRAMUSCULAR | Status: AC
  Administered 2024-09-02: 0.5 mg via INTRAVENOUS
  Filled 2024-09-02: qty 0.5

## 2024-09-02 MED ORDER — IOHEXOL 300 MG/ML  SOLN
100.0000 mL | Freq: Once | INTRAMUSCULAR | Status: AC | PRN
  Administered 2024-09-02: 100 mL via INTRAVENOUS

## 2024-09-02 MED ORDER — DOXYCYCLINE MONOHYDRATE 100 MG PO TABS: 100.0000 mg | ORAL_TABLET | Freq: Two times a day (BID) | ORAL | 0 refills | Status: AC

## 2024-09-02 MED ORDER — OXYCODONE-ACETAMINOPHEN 5-325 MG PO TABS: 1.0000 | ORAL_TABLET | Freq: Three times a day (TID) | ORAL | 0 refills | Status: AC | PRN

## 2024-09-02 MED ORDER — MORPHINE SULFATE (PF) 4 MG/ML IV SOLN
4.0000 mg | Freq: Once | INTRAVENOUS | Status: AC
  Administered 2024-09-02: 4 mg via INTRAVENOUS
  Filled 2024-09-02: qty 1

## 2024-09-02 MED ORDER — SODIUM CHLORIDE 0.9 % IV BOLUS
1000.0000 mL | Freq: Once | INTRAVENOUS | Status: AC
  Administered 2024-09-02: 1000 mL via INTRAVENOUS

## 2024-09-02 MED ORDER — ONDANSETRON HCL 4 MG/2ML IJ SOLN
4.0000 mg | Freq: Once | INTRAMUSCULAR | Status: AC
  Administered 2024-09-02: 4 mg via INTRAVENOUS
  Filled 2024-09-02: qty 2

## 2024-09-02 NOTE — ED Triage Notes (Signed)
 Pt comes with abscess above pubic area for two days. Pt states drainage coming out and it has gotten bigger. Pt states pain radiates.

## 2024-09-02 NOTE — Discharge Instructions (Addendum)
 You have been seen today in the Emergency Department (ED) for cellulitis, a superficial skin infection, of your penis. Please take your antibiotics as prescribed for their ENTIRE prescribed duration.  I will send few percocet for pain. You may also use ibuprofen  with this unless your doctor has told you not to.  Please follow up with the urologist (Dr. Twylla) for recheck of your infection in 24-48 hours. Call your doctor sooner or return to the ED if you develop worsening signs of infection such as: increased redness, increased pain, pus, fever, or other symptoms that concern you.

## 2024-09-03 NOTE — Progress Notes (Signed)
 Chief Complaint:   Chief Complaint  Patient presents with   Mass    Groin area;lump right above  the penis shaft; large lump under right armpit ; right testicular pain and swelling, very shaky and having night sweats. First episode was fluid build up left testicle(this is what it all started)    Subjective  Charles Beasley is a 49 y.o. male established patient in today for: History of Present Illness Charles Beasley is a 49 year old male with diabetes who presents with testicular swelling and pain. He is accompanied by his wife, Charles Beasley.  Testicular and groin symptoms - Intermittent testicular swelling since mid-December. - Three weeks ago developed left testicular swelling with fluid retention; diagnosed as a hydrocele by ER after ultrasound. - Currently has right testicular swelling with warmth, redness, and painful swollen lymph nodes in the right groin and under the right arm. - Large tender knot above the penile shaft in the groin- started as a folliculitis- seen in the ED yesterday for this with US  and CT. Prescribed doxycycline  which he has taken 2 doses of so far without improvement - Significant pain associated with swelling. - Marked fatigue and change from baseline noted by wife.  Constitutional and respiratory symptoms - Extreme fatigue. - Weight loss- but has had stable weight for the last month - Night sweats. - Persistent cough.  Diabetes mellitus - Poorly controlled diabetes. - Previously stopped metformin  due to severe gastrointestinal side effects. - Currently on insulin ; sometimes misses doses. - 14-day home glucose average is 285 mg/dL. - Uses continuous glucose monitor.  Genitourinary symptoms - Erectile dysfunction. - Worsening urinary incontinence over the past year, with urge incontinence and frequent nocturia. - Symptoms severe enough to leave work. - Previously had a bladder mass noted on previous imaging, not present on recent studies. - Had been  referred to urology but has not schedule appointment  Hematologic findings - Persistently elevated white blood cell counts. - Prior normal BCR-ABL and peripheral smear. - Hematology visit planned.  Gastrointestinal and renal symptoms - Inconsistent bowel habits, with more constipation than diarrhea. - Remote right-sided abdominal pain. - History of kidney stones.  Current and recent treatments - Currently taking doxycycline  for presumed cellulitis; completed two doses. - Received IV pain medication and fluids during recent ER visit. - Uses Tylenol  and ibuprofen  for pain at home.  HPI          Patient Active Problem List  Diagnosis   Intermittent asthma (HHS-HCC)   Asthma, mild intermittent (HHS-HCC)   Allergic rhinitis   Esophageal thickening   IBS (irritable bowel syndrome)   Depression   Tinnitus   Carpal tunnel syndrome of right wrist   Gastroesophageal reflux disease without esophagitis   Diet-controlled type 2 diabetes mellitus (CMS/HHS-HCC)   Diabetes mellitus without complication (CMS/HHS-HCC)   Aortic atherosclerosis   Dyshidrotic hand dermatitis   Kidney stone on left side   SOBOE (shortness of breath on exertion)   Abnormal ECG   Malignant neoplasm of urinary bladder, unspecified site (CMS/HHS-HCC)   Fatty liver    Outpatient Medications Prior to Visit  Medication Sig Dispense Refill   ACCU-CHEK SOFTCLIX LANCETS lancets USE ONCE DAILY 100 each 11   albuterol  (PROVENTIL ) 2.5 mg /3 mL (0.083 %) nebulizer solution Take 3 mLs (2.5 mg total) by nebulization every 6 (six) hours as needed for Wheezing 75 mL 12   albuterol  MDI, PROVENTIL , VENTOLIN , PROAIR , HFA 90 mcg/actuation inhaler Inhale 2 inhalations into the lungs every  6 (six) hours as needed for Wheezing 1 each 11   blood glucose diagnostic (ACCU-CHEK GUIDE TEST STRIPS) test strip 2 (two) times daily Use as instructed. 200 each 2   blood glucose meter kit Check glucose once a day.  1 each 0   blood-glucose sensor (DEXCOM G7 SENSOR) Devi Use 1 each every 10 (ten) days 9 each 3   blood-glucose,receiver,cont (DEXCOM G7 RECEIVER) Misc Use 1 Device once daily 1 each 2   busPIRone  (BUSPAR ) 10 MG tablet TAKE 1 TABLET(10 MG) BY MOUTH TWICE DAILY 200 tablet 3   escitalopram oxalate (LEXAPRO) 10 MG tablet Take 1 tablet (10 mg total) by mouth once daily for 90 days Take with the 20 mg table as directed 30 tablet 2   escitalopram oxalate (LEXAPRO) 20 MG tablet TAKE 1 TABLET(20 MG) BY MOUTH DAILY 100 tablet 3   fluticasone-umeclidinium-vilanterol (TRELEGY ELLIPTA) 100-62.5-25 mcg inhaler Inhale 1 Puff into the lungs once daily 1 each 4   gabapentin (NEURONTIN) 100 MG capsule Take 1 capsule (100 mg total) by mouth at bedtime 30 capsule 3   glipiZIDE (GLUCOTROL XL) 10 MG XL tablet TAKE 1 TABLET(10 MG) BY MOUTH TWICE DAILY 200 tablet 3   insulin  GLARGINE (LANTUS SOLOSTAR U-100 INSULIN ) pen injector (concentration 100 units/mL) Inject 34 Units subcutaneously at bedtime 30.6 mL 3   ondansetron  (ZOFRAN -ODT) 4 MG disintegrating tablet Take 1 tablet (4 mg total) by mouth every 8 (eight) hours as needed for Nausea for up to 30 doses 30 tablet 0   pantoprazole  (PROTONIX ) 40 MG DR tablet Take 1 tablet (40 mg total) by mouth 2 (two) times daily before meals Take 15-20 mins before meal 90 tablet 3   pen needle, diabetic 31 gauge x 5/16 needle Use once daily 50 each 12   sodium, potassium, and magnesium (SUPREP) oral solution Take 1 Bottle by mouth as directed One kit contains 2 bottles.  Take both bottles at the times instructed by your provider. 354 mL 0   SUMAtriptan (IMITREX) 25 MG tablet      aspirin 81 MG EC tablet Take 81 mg by mouth once daily (Patient not taking: Reported on 09/03/2024)     blood glucose meter kit as directed 1 each 0   lancing device with lancets kit Use 1 each 2 (two) times daily (Patient not taking: Reported on 08/28/2024) 50 each 12   oxybutynin   (DITROPAN -XL) 10 MG XL tablet Take 10 mg by mouth once daily (Patient not taking: Reported on 09/03/2024)     phenoL-glycerin (CHLORASEPTIC MAX SORE THROAT) 1.5-33 % Spry 1 spray by Mucous Membrane route every 6 (six) hours as needed (Patient not taking: Reported on 09/03/2024) 118 mL 0   simvastatin  (ZOCOR ) 20 MG tablet TAKE 1 TABLET BY MOUTH DAILY AT BEDTIME (Patient not taking: Reported on 09/03/2024) 100 tablet 3   No facility-administered medications prior to visit.    ROS     Objective  Vitals:   09/03/24 1351 09/03/24 1359  BP:  134/77  Pulse:  92  Resp:  20  Temp:  36.6 C (97.8 F)  TempSrc:  Oral  SpO2:  98%  Weight: 67.1 kg (148 lb)   PainSc:    9  PainLoc:  Groin   Body mass index is 21.85 kg/m. Home Vitals:     Physical Exam VITALS: T- 97.8, P- 92, RR- 20 MEASUREMENTS: Weight- 149. GENITOURINARY: Right groin lymphadenopathy, right testicular swelling (see media) Physical Exam Exam conducted with a chaperone present.  Constitutional:  General: He is in acute distress.  Cardiovascular:     Rate and Rhythm: Normal rate and regular rhythm.     Pulses: Normal pulses.     Heart sounds: No murmur heard.    No friction rub. No gallop.  Pulmonary:     Effort: Pulmonary effort is normal. No respiratory distress.     Breath sounds: Normal breath sounds. No wheezing or rales.  Lymphadenopathy:     Cervical: No cervical adenopathy.  Skin:    Comments: 5 mm tender red pustules whiteheads centered on hair follicles, usually on an erythematous base in R axilla         Results Labs WBC (09/02/2024): Elevated GAD antibody (09/02/2024): 0.16 (low) BCR-ABL (2023): Normal Peripheral smear (2023): Normal Continuous glucose monitor (09/02/2024): 14-day average glucose 285; most recent 242; recent high in 400s  Radiology Ultrasound (09/02/2024): Focal abscess with surrounding cellulitis; minimal fluid collection, not amenable to drainage CT abdomen and pelvis  with contrast (09/02/2024): No bladder wall mass; esophageal thickening; mild emphysema   Pathology Celiac disease biopsy: Negative for celiac disease  No results found for this visit on 09/03/24.     Assessment/Plan:   Assessment & Plan Scrotal cellulitis with testicular pain and swelling Acute scrotal cellulitis with right testicular swelling, warmth, and redness. Seen in the ED yesterday with recent imaging showed abscess without sufficient fluid for drainage. Urology consulted, no drainage recommended. Started on doxycycline , has only taken 3 doses. Suspect uncontrolled DM/hyperglycemia is partially to blame.  - Continue doxycycline  and monitor response. - Referred to urology for further evaluation of testicular swelling and pain. - Prescribed topical clindamycin for local infection control. - Prescribed naproxen  for pain management. - Advised use of heating pad and ice for symptomatic relief. - Instructed to return to ER if fever, worsening pain, or spreading redness occurs.  Type 2 diabetes mellitus, poorly controlled Poorly controlled type 2 diabetes with recent hyperglycemia due to missed insulin  doses. Previous metformin  use discontinued due to gastrointestinal side effects. Concerns about diabetes contributing to overall symptoms and potential complications. - Referred to clinical pharmacist for diabetes management consultation. - Continue insulin  therapy with emphasis on adherence. - Monitor blood glucose levels closely.  Urge urinary incontinence Chronic urge urinary incontinence with nocturia and post-void dribbling. Symptoms worsened over the past year. Previous urology evaluation attributed symptoms to diabetes. - Referred to urology for further evaluation and management.  Erectile dysfunction Chronic erectile dysfunction since 2018, possibly related to diabetes and other systemic issues. Symptoms worsened over the past year. - Referred to urology for further  evaluation and management.  Constipation Intermittent constipation with occasional diarrhea. Possible correlation with diabetes and systemic issues. Wife is concerned for Crohn's disease due to bowel symptoms and systemic manifestations. - Ordered stool study to assess for inflammation. - Checked inflammatory markers.  Emphysema Mild emphysema noted on recent imaging. Symptoms include shortness of breath and dizziness, limiting physical activity. - Continue current management and monitor symptoms.  FMLA paper work was completed today Diagnoses and all orders for this visit:  Pain in both testicles -     Ambulatory Referral to Urology  Erectile dysfunction, unspecified erectile dysfunction type -     Ambulatory Referral to Urology  Urge incontinence of urine -     Ambulatory Referral to Urology  Folliculitis -     C-Reactive Protein (CRP), Inflammatory; Future -     Sedimentation Rate-Automated; Future -     Complete Blood Count (CBC) with Differential; Future -  clindamycin (CLEOCIN T) 1 % topical gel; Apply topically 2 (two) times daily -     naproxen  (NAPROSYN ) 500 MG tablet; Take 1 tablet (500 mg total) by mouth 2 (two) times daily with meals for 30 days  Diabetes mellitus without complication (CMS/HHS-HCC)  Constipation, unspecified constipation type -     Calprotectin, Fecal - SENDOUT [facility]  Other orders -     Follow up in Primary Care; Future    This visit was coded based on medical decision making (MDM).      Follow up in 1 month (on 10/01/2024). Follow-up     Future Labs/Procedures Expected by Expires   Follow up in Primary Care [[id number] Custom]  10/01/2024 12/01/2024   Questions:     Does this order need to be coordinated with another visit or should it be hidden from the patient portal? If yes to either, the patient will need to stop by the front desk or call to schedule.: No   Who is this follow-up with?: Me   Can this appointment be overbooked by a  scheduler?: No   What type of follow up is needed?: Office Visit   What's the reason for follow up?: Chronic Medical Problems   Allow telemedicine?: In Person Only         Future Appointments     Date/Time Provider Department Center Visit Type   09/08/2024 8:30 AM (Arrive by 8:15 AM) Montgomery, Lamarr Norris, PA [facility] NEW PATIENT   09/16/2024 2:00 PM (Arrive by 1:45 PM) Zachary Idelia Sherita Jock, MD [facility] [place] [facility] OFFICE VISIT   09/18/2024  4:00 PM (Arrive by 3:40 PM) CC CT 2 [facility]  CT Cancer Ctr CT COMBO   10/01/2024 1:30 PM (Arrive by 1:15 PM) Will Geofm Dines, MD [facility] OFFICE VISIT   10/07/2024 1:00 PM (Arrive by 12:45 PM) Nathaniel Niels Mad, NP [facility] NEW PATIENT   10/15/2024 4:00 PM (Arrive by 3:45 PM) Will Geofm Dines, MD [facility] OFFICE VISIT   12/05/2024 8:00 PM (Arrive by 7:45 PM) SLEEP DISORDER CENTER [facility] - OVERNIGHT STUDY   12/10/2024 3:30 PM Theotis Lavelle BRAVO, MD [facility] C RETURN VISIT       There are no Patient Instructions on file for this visit.  An after visit summary was provided for the patient either in written format (printed) or through [facility].  This note has been created using automated tools and reviewed for accuracy by [facility] RUTH VERNON.

## 2024-09-04 ENCOUNTER — Ambulatory Visit: Admit: 2024-09-04

## 2024-09-05 ENCOUNTER — Ambulatory Visit

## 2024-09-05 VITALS — BP 108/69 | HR 93 | Ht 69.0 in | Wt 150.0 lb

## 2024-09-05 DIAGNOSIS — N4822 Cellulitis of corpus cavernosum and penis: Secondary | ICD-10-CM

## 2024-09-05 NOTE — Progress Notes (Unsigned)
 "  09/05/2024 11:30 AM   Charles Beasley [birth year] [id number]  CC: Chief Complaint  Patient presents with   Penis Pain    HPI: Charles Beasley is a 49 y.o. male with PMH significant for uncontrolled diabetes, bladder cancer, and nephrolithiasis who presents today for cellulitis of the penis.   The patient presented to the ED on 09/02/24 for a 2-day history of pain and erythema located just above the penis accompanied by RLQ and inguinal pain. Patient reported that initially he had plucked a hair in his suprapubic region and shaved and then developed swelling and pain shortly after. He experienced some purulent drainage and pain that radiated to his testicles bilaterally.  Ultrasound soft tissue was performed which showed cobblestoning. CT abdomen pelvis showed focal inflammatory standing in the mons pubis at the dorsal base of the penile shaft. Findings with both images are most consistent with a diagnosis of cellulitis. Urology was consulted and PO antibiotics, not I&D the area at the time. Due to patient's significant pain and history of uncontrolled diabetes, it was recommended that he have a wound check in 24 to 48 hours. On discharge from the ED patient was prescribed a 7-day course of doxycycline  and given Percocet for pain management.   Yesterday he was seen by his PCP for follow-up. The patient was prescribed topical clindamycin for local infection control and Naproxen  for pain management. He was advised to use a heating pad and ice for symptomatic relief.  Today he reports continued pain, redness and swelling of the mons pubis area. States he has seen improvement in symptoms since taking antibiotics. Overall pain is not well manage well managed with Naproxen . He states he has a high pain tolerance. Currently rates his pain a 7/10 on the pain scale. This is reduced from 8/9 yesterday. He is using a heating pad at night which he states has helped with pain and swelling.   He denies abdominal  pain, flank pain, urinary frequency or urgency, fever, chills, or vomiting.   PMH: Past Medical History:  Diagnosis Date   Asthma    Bladder cancer (HCC)    Colitis    Diabetes mellitus without complication (HCC)    Diverticulosis    GERD (gastroesophageal reflux disease)    History of kidney stones    IBS (irritable bowel syndrome)     Surgical History: Past Surgical History:  Procedure Laterality Date   COLONOSCOPY WITH PROPOFOL  N/A 01/22/2020   Procedure: COLONOSCOPY WITH PROPOFOL ;  Surgeon: Janalyn Keene NOVAK, MD;  Location: [facility] ENDOSCOPY;  Service: Endoscopy;  Laterality: N/A;   ESOPHAGOGASTRODUODENOSCOPY (EGD) WITH PROPOFOL  N/A 01/22/2020   Procedure: ESOPHAGOGASTRODUODENOSCOPY (EGD) WITH PROPOFOL ;  Surgeon: Janalyn Keene NOVAK, MD;  Location: [facility] ENDOSCOPY;  Service: Endoscopy;  Laterality: N/A;   EXTRACORPOREAL SHOCK WAVE LITHOTRIPSY Left 08/03/2022   Procedure: EXTRACORPOREAL SHOCK WAVE LITHOTRIPSY (ESWL);  Surgeon: Penne Knee, MD;  Location: [facility] ORS;  Service: Urology;  Laterality: Left;    Home Medications:  Allergies as of 09/05/2024       Reactions   Montelukast Photosensitivity   Sertraline Other (See Comments)   Feels out of it, zombie   Tamsulosin  Other (See Comments)   Backwards ejaculation        Medication List        Accurate as of September 05, 2024 11:30 AM. If you have any questions, ask your nurse or doctor.          STOP taking these medications    amoxicillin -clavulanate  875-125 MG tablet Commonly known as: AUGMENTIN        TAKE these medications    Accu-Chek Guide test strip Generic drug: glucose blood 2 (two) times daily. for testing as directed   Accu-Chek Softclix Lancets lancets 1 each 3 (three) times daily.   albuterol  108 (90 Base) MCG/ACT inhaler Commonly known as: VENTOLIN  HFA Inhale 2 puffs into the lungs.   aspirin EC 81 MG tablet Take 81 mg by mouth.   busPIRone  7.5 MG tablet Commonly known as:  BUSPAR  Take 7.5 mg by mouth 2 (two) times daily.   citalopram  20 MG tablet Commonly known as: CELEXA  Take 20 mg by mouth daily.   clindamycin 1 % gel Commonly known as: CLINDAGEL Apply 1 Application topically 2 (two) times daily.   Dexcom G7 Receiver Devi 1 Device by Other route.   Dexcom G7 Sensor Misc 1 each by Other route.   dicyclomine  10 MG capsule Commonly known as: BENTYL  Take 1 capsule (10 mg total) by mouth 3 (three) times daily as needed (abdominal pain).   doxycycline  100 MG tablet Commonly known as: ADOXA Take 1 tablet (100 mg total) by mouth 2 (two) times daily for 7 days.   escitalopram 20 MG tablet Commonly known as: LEXAPRO Take 20 mg by mouth daily.   gabapentin 100 MG capsule Commonly known as: NEURONTIN Take 100 mg by mouth at bedtime.   glipiZIDE 5 MG 24 hr tablet Commonly known as: GLUCOTROL XL Take 5 mg by mouth daily.   Lantus SoloStar 100 UNIT/ML Solostar Pen Generic drug: insulin  glargine Inject 34 Units into the skin.   metFORMIN  1000 MG tablet Commonly known as: GLUCOPHAGE  Take 1,000 mg by mouth 2 (two) times daily.   Na Sulfate-K Sulfate-Mg Sulfate concentrate 17.5-3.13-1.6 GM/177ML Soln Commonly known as: SUPREP Take 1 Bottle by mouth.   naproxen  500 MG tablet Commonly known as: Naprosyn  Take 1 tablet (500 mg total) by mouth 2 (two) times daily with a meal.   ondansetron  4 MG disintegrating tablet Commonly known as: ZOFRAN -ODT Take 1 tablet (4 mg total) by mouth every 8 (eight) hours as needed for nausea or vomiting.   oxybutynin  10 MG 24 hr tablet Commonly known as: DITROPAN -XL Take 1 tablet (10 mg total) by mouth daily.   oxyCODONE -acetaminophen  5-325 MG tablet Commonly known as: Percocet Take 1 tablet by mouth every 8 (eight) hours as needed for up to 3 days.   pantoprazole  40 MG tablet Commonly known as: PROTONIX  Take 40 mg by mouth daily.   propranolol  40 MG tablet Commonly known as: INDERAL  Take 1 tablet (40 mg  total) by mouth 2 (two) times daily.   simvastatin  20 MG tablet Commonly known as: ZOCOR  Take 20 mg by mouth at bedtime.   Sure Comfort Pen Needles 31G X 8 MM Misc Generic drug: Insulin  Pen Needle daily.   tamsulosin  0.4 MG Caps capsule Commonly known as: Flomax  Take 1 capsule (0.4 mg total) by mouth daily.   Trelegy Ellipta 100-62.5-25 MCG/ACT Aepb Generic drug: Fluticasone-Umeclidin-Vilant Inhale 1 puff into the lungs.        Allergies:  Allergies[1]  Family History: Family History  Problem Relation Age of Onset   Breast cancer Mother    Hypertension Father    Prostate cancer Neg Hx    Bladder Cancer Neg Hx    Kidney cancer Neg Hx     Social History:   reports that he has quit smoking. His smoking use included cigarettes. He has been exposed to tobacco  smoke. He has never used smokeless tobacco. He reports that he does not drink alcohol and does not use drugs.  Physical Exam: BP 108/69 (BP Location: Left Arm, Patient Position: Sitting, Cuff Size: Normal)   Pulse 93   Ht 5' 9 (1.753 m)   Wt 150 lb (68 kg)   SpO2 98%   BMI 22.15 kg/m   Constitutional:  Alert and oriented, no acute distress, nontoxic appearing HEENT: [place], AT Cardiovascular: No clubbing, cyanosis, or edema Respiratory: Normal respiratory effort, no increased work of breathing GU: erythematous indurated area just above, right groin lymphadenopathy, mild right testicular swelling Skin: No rashes, bruises or suspicious lesions Neurologic: Grossly intact, no focal deficits, moving all 4 extremities Psychiatric: Normal mood and affect  Laboratory Data: Lab Results  Component Value Date   WBC 13.2 (H) 09/02/2024   HGB 13.7 09/02/2024   HCT 41.1 09/02/2024   MCV 89.9 09/02/2024   PLT 365 09/02/2024    Lab Results  Component Value Date   CREATININE 0.83 09/02/2024    Estimated Creatinine Clearance: 102.4 mL/min (by C-G formula based on SCr of 0.83 mg/dL).  Results for orders placed or  performed during the hospital encounter of 09/02/24  CBC   Collection Time: 09/02/24 11:12 AM  Result Value Ref Range   WBC 13.2 (H) 4.0 - 10.5 K/uL   RBC 4.57 4.22 - 5.81 MIL/uL   Hemoglobin 13.7 13.0 - 17.0 g/dL   HCT 58.8 60.9 - 47.9 %   MCV 89.9 80.0 - 100.0 fL   MCH 30.0 26.0 - 34.0 pg   MCHC 33.3 30.0 - 36.0 g/dL   RDW 86.5 88.4 - 84.4 %   Platelets 365 150 - 400 K/uL   nRBC 0.0 0.0 - 0.2 %  Basic metabolic panel   Collection Time: 09/02/24 11:12 AM  Result Value Ref Range   Sodium 140 135 - 145 mmol/L   Potassium 4.3 3.5 - 5.1 mmol/L   Chloride 104 98 - 111 mmol/L   CO2 24 22 - 32 mmol/L   Glucose, Bld 410 (H) 70 - 99 mg/dL   BUN 13 6 - 20 mg/dL   Creatinine, Ser 9.16 0.61 - 1.24 mg/dL   Calcium 8.9 8.9 - 89.6 mg/dL   GFR, Estimated >39 >39 mL/min   Anion gap 13 5 - 15  Lactic acid, plasma   Collection Time: 09/02/24  2:09 PM  Result Value Ref Range   Lactic Acid, Venous 0.9 0.5 - 1.9 mmol/L  Urinalysis, Routine w reflex microscopic -Urine, Clean Catch   Collection Time: 09/02/24  2:09 PM  Result Value Ref Range   Color, Urine YELLOW (A) YELLOW   APPearance CLEAR (A) CLEAR   Specific Gravity, Urine 1.030 1.005 - 1.030   pH 5.0 5.0 - 8.0   Glucose, UA >=500 (A) NEGATIVE mg/dL   Hgb urine dipstick SMALL (A) NEGATIVE   Bilirubin Urine NEGATIVE NEGATIVE   Ketones, ur 5 (A) NEGATIVE mg/dL   Protein, ur NEGATIVE NEGATIVE mg/dL   Nitrite NEGATIVE NEGATIVE   Leukocytes,Ua NEGATIVE NEGATIVE   RBC / HPF 0-5 0 - 5 RBC/hpf   WBC, UA 0-5 0 - 5 WBC/hpf   Bacteria, UA NONE SEEN NONE SEEN   Squamous Epithelial / HPF 0-5 0 - 5 /HPF    Pertinent Imaging: CT Abdomen Pelvis W Contrast  I personally reviewed the images referenced above and note ***.  Assessment & Plan:   Charles Beasley is a 49 y.o. male with PMH  significant for uncontrolled diabetes, bladder cancer, and nephrolithiasis who presents today for cellulitis of the penis.   He has experienced symptom  improvement.  Cellulitis of penis - Continue antibiotics - Naproxen  for pain management as needed - Heating pad and ice for symptomatic relief - RTC to clinic in 1 week for symptom reassessment.   There are no diagnoses linked to this encounter.  No follow-ups on file.  Marry MALVA Sara, PA-C  [facility] [address], [address] [place], [place] [address] [phone]      [1]  Allergies Allergen Reactions   Montelukast Photosensitivity   Sertraline Other (See Comments)    Feels out of it, zombie   Tamsulosin  Other (See Comments)    Backwards ejaculation   "

## 2024-09-12 ENCOUNTER — Ambulatory Visit
